# Patient Record
Sex: Female | Born: 2000 | Hispanic: No | Marital: Single | Smoking: Former smoker
Health system: Southern US, Community
[De-identification: ages and names within clinical notes are randomized; demographics above are authoritative.]

## PROBLEM LIST (undated history)

## (undated) ENCOUNTER — Inpatient Hospital Stay (HOSPITAL_COMMUNITY): Payer: Self-pay

## (undated) DIAGNOSIS — F319 Bipolar disorder, unspecified: Secondary | ICD-10-CM

## (undated) DIAGNOSIS — F32A Depression, unspecified: Secondary | ICD-10-CM

## (undated) DIAGNOSIS — F909 Attention-deficit hyperactivity disorder, unspecified type: Secondary | ICD-10-CM

## (undated) DIAGNOSIS — F419 Anxiety disorder, unspecified: Secondary | ICD-10-CM

## (undated) DIAGNOSIS — R569 Unspecified convulsions: Secondary | ICD-10-CM

## (undated) DIAGNOSIS — F99 Mental disorder, not otherwise specified: Secondary | ICD-10-CM

## (undated) DIAGNOSIS — F329 Major depressive disorder, single episode, unspecified: Secondary | ICD-10-CM

---
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
## 2019-01-08 ENCOUNTER — Other Ambulatory Visit: Payer: Self-pay | Admitting: Pediatrics

## 2019-01-08 ENCOUNTER — Other Ambulatory Visit: Payer: Self-pay

## 2019-01-08 DIAGNOSIS — F902 Attention-deficit hyperactivity disorder, combined type: Secondary | ICD-10-CM

## 2019-01-08 MED ORDER — LISDEXAMFETAMINE DIMESYLATE 40 MG PO CAPS: 40.0000 mg | ORAL_CAPSULE | Freq: Every day | ORAL | 0 refills | Status: DC
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
## 2019-02-13 ENCOUNTER — Encounter: Payer: Self-pay | Admitting: Family

## 2019-02-13 ENCOUNTER — Other Ambulatory Visit: Payer: Self-pay

## 2019-02-13 ENCOUNTER — Ambulatory Visit (INDEPENDENT_AMBULATORY_CARE_PROVIDER_SITE_OTHER): Payer: Medicaid Other | Admitting: Family

## 2019-02-13 VITALS — BP 126/82 | HR 89 | Wt 163.0 lb

## 2019-02-13 DIAGNOSIS — E559 Vitamin D deficiency, unspecified: Secondary | ICD-10-CM

## 2019-02-13 DIAGNOSIS — Z3046 Encounter for surveillance of implantable subdermal contraceptive: Secondary | ICD-10-CM | POA: Diagnosis not present

## 2019-02-13 DIAGNOSIS — Z113 Encounter for screening for infections with a predominantly sexual mode of transmission: Secondary | ICD-10-CM

## 2019-02-13 DIAGNOSIS — Z3202 Encounter for pregnancy test, result negative: Secondary | ICD-10-CM
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
## 2019-02-14 LAB — C. TRACHOMATIS/N. GONORRHOEAE RNA
C. trachomatis RNA, TMA: NOT DETECTED
N. gonorrhoeae RNA, TMA: NOT DETECTED

## 2019-02-14 LAB — WET PREP BY MOLECULAR PROBE
Candida species: NOT DETECTED
Gardnerella vaginalis: NOT DETECTED
MICRO NUMBER:: 537613
SPECIMEN QUALITY:: ADEQUATE
Trichomonas vaginosis: NOT DETECTED

## 2019-02-24 ENCOUNTER — Other Ambulatory Visit: Payer: Self-pay

## 2019-02-24 ENCOUNTER — Encounter (HOSPITAL_BASED_OUTPATIENT_CLINIC_OR_DEPARTMENT_OTHER): Payer: Self-pay | Admitting: *Deleted

## 2019-02-24 ENCOUNTER — Emergency Department (HOSPITAL_BASED_OUTPATIENT_CLINIC_OR_DEPARTMENT_OTHER)
Admission: EM | Admit: 2019-02-24 | Discharge: 2019-02-24 | Disposition: A | Discharge status: Home / Self Care | Payer: Medicaid Other | Attending: Emergency Medicine | Admitting: Emergency Medicine

## 2019-02-24 DIAGNOSIS — Z79899 Other long term (current) drug therapy: Secondary | ICD-10-CM | POA: Diagnosis not present

## 2019-02-24 DIAGNOSIS — F909 Attention-deficit hyperactivity disorder, unspecified type: Secondary | ICD-10-CM | POA: Insufficient documentation

## 2019-02-24 DIAGNOSIS — K120 Recurrent oral aphthae: Secondary | ICD-10-CM | POA: Insufficient documentation

## 2019-02-24 DIAGNOSIS — K1370 Unspecified lesions of oral mucosa: Secondary | ICD-10-CM | POA: Diagnosis present

## 2019-02-24 MED ORDER — LIDOCAINE VISCOUS HCL 2 % MT SOLN
1.0000 mL | Freq: Once | OROMUCOSAL | Status: AC
  Administered 2019-02-24: 1 mL via OROMUCOSAL
  Filled 2019-02-24: qty 15

## 2019-02-24 NOTE — ED Triage Notes (Signed)
Mouth lesion.

## 2019-02-24 NOTE — Discharge Instructions (Addendum)
This site should resolve without complication. I recommend avoiding spicy, acidic and salty foods which can cause increased pain.  You may apply a small dab of the lidocaine prescribed every 2-3 hours if needed for pain relief.
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
## 2019-06-04 ENCOUNTER — Encounter: Payer: Self-pay | Admitting: Family

## 2019-06-04 ENCOUNTER — Telehealth: Payer: Self-pay

## 2019-06-04 DIAGNOSIS — B9689 Other specified bacterial agents as the cause of diseases classified elsewhere: Secondary | ICD-10-CM

## 2019-06-04 NOTE — Telephone Encounter (Signed)
Called patient. She reports that she has started having changes in vaginal discharge as it has in the past with BV. She has no foul odor, no itching or burning. No provider in office this week and nurse visit schedule full for tomorrow AM. She asks for medication to be sent to pharmacy. Suggested patient be seen in the office for swabs if symptoms worsen or persist with medication prescribed.

## 2019-06-05 ENCOUNTER — Encounter: Payer: Self-pay | Admitting: Family

## 2019-06-05 MED ORDER — CLINDAMYCIN PHOSPHATE 2 % VA CREA: 1.0000 | TOPICAL_CREAM | Freq: Every day | VAGINAL | 0 refills | Status: DC

## 2019-06-05 NOTE — Telephone Encounter (Signed)
Made pt aware via MyChart.

## 2019-06-05 NOTE — Telephone Encounter (Signed)
Sent clindamycin vaginal cream as pt has allergy to metronidazole

## 2019-06-19 ENCOUNTER — Encounter: Payer: Self-pay | Admitting: Family

## 2019-06-26 ENCOUNTER — Encounter: Payer: Self-pay | Admitting: Family

## 2019-07-03 ENCOUNTER — Encounter: Payer: Self-pay | Admitting: Family

## 2019-07-03 ENCOUNTER — Ambulatory Visit (INDEPENDENT_AMBULATORY_CARE_PROVIDER_SITE_OTHER): Payer: Medicaid Other | Admitting: Family

## 2019-07-03 ENCOUNTER — Other Ambulatory Visit: Payer: Self-pay

## 2019-07-03 ENCOUNTER — Other Ambulatory Visit: Payer: Self-pay | Admitting: Pediatrics

## 2019-07-03 VITALS — BP 125/79 | HR 94 | Ht 64.0 in | Wt 174.8 lb

## 2019-07-03 DIAGNOSIS — N760 Acute vaginitis: Secondary | ICD-10-CM

## 2019-07-03 DIAGNOSIS — Z87898 Personal history of other specified conditions: Secondary | ICD-10-CM | POA: Diagnosis not present

## 2019-07-03 DIAGNOSIS — N898 Other specified noninflammatory disorders of vagina: Secondary | ICD-10-CM

## 2019-07-03 DIAGNOSIS — B9689 Other specified bacterial agents as the cause of diseases classified elsewhere: Secondary | ICD-10-CM
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
## 2019-07-03 NOTE — Patient Instructions (Signed)
Seizures may happen at any time. It is important to take certain precautions to maintain your safety.   You should not drive. You must report yourself to the [facility] of [employer] as a second language [employer] Program by calling [phone].When possible, take showers instead of baths, as it is possible to drown in even shallow water during a seizure. Do not swim unsupervised or in open water where rescue could be difficult. Do not climb to heights and do not operate heavy machinery. When cooking, use the back burners of the stove and avoid open flames or hot stove tops. Avoid any activities which could be dangerous in the event of a loss of consciousness.   We will call you with results from today's visit.

## 2019-07-04 ENCOUNTER — Other Ambulatory Visit: Payer: Self-pay | Admitting: Family

## 2019-07-04 DIAGNOSIS — B373 Candidiasis of vulva and vagina: Secondary | ICD-10-CM

## 2019-07-04 DIAGNOSIS — N760 Acute vaginitis: Secondary | ICD-10-CM

## 2019-07-04 DIAGNOSIS — B3731 Acute candidiasis of vulva and vagina: Secondary | ICD-10-CM

## 2019-07-04 DIAGNOSIS — B9689 Other specified bacterial agents as the cause of diseases classified elsewhere: Secondary | ICD-10-CM

## 2019-07-04 LAB — WET PREP BY MOLECULAR PROBE
Candida species: DETECTED — AB
Gardnerella vaginalis: NOT DETECTED
MICRO NUMBER:: 1018802
SPECIMEN QUALITY:: ADEQUATE
Trichomonas vaginosis: NOT DETECTED

## 2019-07-04 MED ORDER — CLINDAMYCIN PHOSPHATE 2 % VA CREA: TOPICAL_CREAM | VAGINAL | 0 refills | Status: DC

## 2019-07-04 MED ORDER — FLUCONAZOLE 150 MG PO TABS: ORAL_TABLET | ORAL | 0 refills | Status: DC

## 2019-07-05 ENCOUNTER — Encounter: Payer: Self-pay | Admitting: Family

## 2019-07-22 ENCOUNTER — Encounter: Payer: Self-pay | Admitting: Family

## 2019-07-22 ENCOUNTER — Other Ambulatory Visit: Payer: Self-pay | Admitting: Family

## 2019-07-22 DIAGNOSIS — B9689 Other specified bacterial agents as the cause of diseases classified elsewhere: Secondary | ICD-10-CM

## 2019-07-22 DIAGNOSIS — N760 Acute vaginitis: Secondary | ICD-10-CM

## 2019-07-31 ENCOUNTER — Other Ambulatory Visit: Payer: Self-pay

## 2019-07-31 ENCOUNTER — Encounter: Payer: Self-pay | Admitting: Family

## 2019-07-31 DIAGNOSIS — F902 Attention-deficit hyperactivity disorder, combined type: Secondary | ICD-10-CM

## 2019-08-01 MED ORDER — LISDEXAMFETAMINE DIMESYLATE 40 MG PO CAPS: 40.0000 mg | ORAL_CAPSULE | Freq: Every day | ORAL | 0 refills | Status: DC

## 2019-08-05 ENCOUNTER — Encounter: Payer: Self-pay | Admitting: Family

## 2019-08-10 ENCOUNTER — Encounter: Payer: Self-pay | Admitting: Family

## 2019-08-13 ENCOUNTER — Other Ambulatory Visit: Payer: Self-pay | Admitting: Family

## 2019-08-13 ENCOUNTER — Encounter: Payer: Self-pay | Admitting: Family

## 2019-08-13 DIAGNOSIS — R42 Dizziness and giddiness: Secondary | ICD-10-CM

## 2019-08-15 ENCOUNTER — Encounter: Payer: Self-pay | Admitting: Family

## 2019-08-15 ENCOUNTER — Other Ambulatory Visit: Payer: Medicaid Other

## 2019-08-15 ENCOUNTER — Other Ambulatory Visit: Payer: Self-pay

## 2019-08-15 DIAGNOSIS — R42 Dizziness and giddiness: Secondary | ICD-10-CM

## 2019-08-15 NOTE — Progress Notes (Signed)
Labs drawn by Theressa ford, CMA 

## 2019-08-18 ENCOUNTER — Other Ambulatory Visit: Payer: Self-pay | Admitting: Family

## 2019-08-18 ENCOUNTER — Encounter: Payer: Self-pay | Admitting: Family

## 2019-08-18 MED ORDER — VITAMIN D (ERGOCALCIFEROL) 1.25 MG (50000 UNIT) PO CAPS: 50000.0000 [IU] | ORAL_CAPSULE | ORAL | 0 refills | Status: DC

## 2019-08-19 ENCOUNTER — Encounter: Payer: Self-pay | Admitting: Family

## 2019-08-20 ENCOUNTER — Other Ambulatory Visit: Payer: Self-pay | Admitting: Pediatrics

## 2019-08-20 ENCOUNTER — Encounter: Payer: Self-pay | Admitting: Family

## 2019-08-20 DIAGNOSIS — B3731 Acute candidiasis of vulva and vagina: Secondary | ICD-10-CM

## 2019-08-20 DIAGNOSIS — B373 Candidiasis of vulva and vagina: Secondary | ICD-10-CM

## 2019-08-20 LAB — ADD ON CMP
AG Ratio: 1.6 (calc) (ref 1.0–2.5)
ALT: 18 U/L (ref 5–32)
AST: 22 U/L (ref 12–32)
Albumin: 4.4 g/dL (ref 3.6–5.1)
Alkaline phosphatase (APISO): 74 U/L (ref 36–128)
BUN/Creatinine Ratio: 6 (calc) (ref 6–22)
BUN: 5 mg/dL — ABNORMAL LOW (ref 7–20)
CO2: 19 mmol/L — ABNORMAL LOW (ref 20–32)
Calcium: 10 mg/dL (ref 8.9–10.4)
Chloride: 103 mmol/L (ref 98–110)
Creat: 0.81 mg/dL (ref 0.50–1.00)
GFR, Est African American: 122 mL/min/{1.73_m2} (ref >=60)
GFR, Est Non African American: 105 mL/min/{1.73_m2} (ref >=60)
Globulin: 2.8 g/dL (calc) (ref 2.0–3.8)
Glucose, Bld: 88 mg/dL (ref 65–99)
Potassium: 4.2 mmol/L (ref 3.8–5.1)
Sodium: 143 mmol/L (ref 135–146)
Total Bilirubin: 0.5 mg/dL (ref 0.2–1.1)
Total Protein: 7.2 g/dL (ref 6.3–8.2)

## 2019-08-20 LAB — CBC WITH DIFFERENTIAL/PLATELET
Absolute Monocytes: 403 cells/uL (ref 200–950)
Basophils Absolute: 53 cells/uL (ref 0–200)
Basophils Relative: 0.8 %
Eosinophils Absolute: 99 cells/uL (ref 15–500)
Eosinophils Relative: 1.5 %
HCT: 41.2 % (ref 35.0–45.0)
Hemoglobin: 13.4 g/dL (ref 11.7–15.5)
Lymphs Abs: 1769 cells/uL (ref 850–3900)
MCH: 28.4 pg (ref 27.0–33.0)
MCHC: 32.5 g/dL (ref 32.0–36.0)
MCV: 87.3 fL (ref 80.0–100.0)
MPV: 11.5 fL (ref 7.5–12.5)
Monocytes Relative: 6.1 %
Neutro Abs: 4277 cells/uL (ref 1500–7800)
Neutrophils Relative %: 64.8 %
Platelets: 279 10*3/uL (ref 140–400)
RBC: 4.72 10*6/uL (ref 3.80–5.10)
RDW: 11.6 % (ref 11.0–15.0)
Total Lymphocyte: 26.8 %
WBC: 6.6 10*3/uL (ref 3.8–10.8)

## 2019-08-20 LAB — TEST AUTHORIZATION

## 2019-08-20 LAB — HEMOGLOBIN A1C W/OUT EAG: Hgb A1c MFr Bld: 5.4 % of total Hgb (ref <5.7)

## 2019-08-20 LAB — T4, FREE: Free T4: 1 ng/dL (ref 0.8–1.4)

## 2019-08-20 LAB — VITAMIN D 25 HYDROXY (VIT D DEFICIENCY, FRACTURES): Vit D, 25-Hydroxy: 17 ng/mL — ABNORMAL LOW (ref 30–100)

## 2019-08-20 LAB — LIPID PANEL
Cholesterol: 156 mg/dL (ref <170)
HDL: 59 mg/dL (ref >45)
LDL Cholesterol (Calc): 87 mg/dL (calc) (ref <110)
Non-HDL Cholesterol (Calc): 97 mg/dL (calc) (ref <120)
Total CHOL/HDL Ratio: 2.6 (calc) (ref <5.0)
Triglycerides: 35 mg/dL (ref <90)

## 2019-08-20 LAB — VITAMIN B12: Vitamin B-12: 334 pg/mL (ref 200–1100)

## 2019-08-20 LAB — AMYLASE: Amylase: 88 U/L (ref 21–101)

## 2019-08-20 LAB — TSH: TSH: 2.37 mIU/L

## 2019-08-20 MED ORDER — FLUCONAZOLE 150 MG PO TABS: ORAL_TABLET | ORAL | 0 refills | Status: DC

## 2019-08-29 ENCOUNTER — Encounter: Payer: Self-pay | Admitting: Family

## 2019-08-29 ENCOUNTER — Encounter: Payer: Medicaid Other | Admitting: Obstetrics & Gynecology

## 2019-09-08 ENCOUNTER — Encounter: Payer: Self-pay | Admitting: Family

## 2019-09-24 ENCOUNTER — Encounter: Payer: Self-pay | Admitting: Family

## 2019-09-24 ENCOUNTER — Other Ambulatory Visit: Payer: Self-pay | Admitting: Pediatrics

## 2019-09-24 ENCOUNTER — Telehealth: Payer: Self-pay | Admitting: Pediatrics

## 2019-09-24 ENCOUNTER — Other Ambulatory Visit: Payer: Self-pay | Admitting: Family

## 2019-09-24 DIAGNOSIS — F902 Attention-deficit hyperactivity disorder, combined type: Secondary | ICD-10-CM

## 2019-09-24 DIAGNOSIS — Z87898 Personal history of other specified conditions: Secondary | ICD-10-CM

## 2019-09-24 MED ORDER — LISDEXAMFETAMINE DIMESYLATE 40 MG PO CAPS: 40.0000 mg | ORAL_CAPSULE | Freq: Every day | ORAL | 0 refills | Status: DC

## 2019-09-24 MED ORDER — VITAMIN D (ERGOCALCIFEROL) 1.25 MG (50000 UNIT) PO CAPS: 50000.0000 [IU] | ORAL_CAPSULE | ORAL | 0 refills | Status: DC

## 2019-09-24 NOTE — Telephone Encounter (Signed)

## 2019-09-25 ENCOUNTER — Encounter: Payer: Self-pay | Admitting: Family

## 2019-09-25 ENCOUNTER — Ambulatory Visit: Payer: Medicaid Other | Admitting: Family

## 2019-09-26 ENCOUNTER — Encounter: Payer: Medicaid Other | Admitting: Obstetrics & Gynecology

## 2019-09-30 ENCOUNTER — Encounter: Payer: Self-pay | Admitting: Family

## 2019-09-30 ENCOUNTER — Other Ambulatory Visit: Payer: Self-pay | Admitting: Pediatrics

## 2019-09-30 MED ORDER — KETOCONAZOLE 2 % EX CREA: 1.0000 "application " | TOPICAL_CREAM | Freq: Every day | CUTANEOUS | 0 refills | Status: DC

## 2019-10-01 ENCOUNTER — Encounter: Payer: Self-pay | Admitting: Family

## 2019-10-01 ENCOUNTER — Ambulatory Visit (INDEPENDENT_AMBULATORY_CARE_PROVIDER_SITE_OTHER): Payer: Medicaid Other | Admitting: Pediatrics

## 2019-10-01 ENCOUNTER — Other Ambulatory Visit: Payer: Self-pay

## 2019-10-01 ENCOUNTER — Encounter: Payer: Self-pay | Admitting: Pediatrics

## 2019-10-01 ENCOUNTER — Other Ambulatory Visit (HOSPITAL_COMMUNITY)
Admission: RE | Admit: 2019-10-01 | Discharge: 2019-10-01 | Disposition: A | Discharge status: Home / Self Care | Payer: Medicaid Other | Source: Ambulatory Visit | Attending: Pediatrics | Admitting: Pediatrics

## 2019-10-01 VITALS — Temp 97.2°F | Wt 160.5 lb

## 2019-10-01 DIAGNOSIS — Z113 Encounter for screening for infections with a predominantly sexual mode of transmission: Secondary | ICD-10-CM | POA: Insufficient documentation

## 2019-10-01 DIAGNOSIS — N760 Acute vaginitis: Secondary | ICD-10-CM

## 2019-10-01 DIAGNOSIS — B354 Tinea corporis: Secondary | ICD-10-CM

## 2019-10-01 DIAGNOSIS — N898 Other specified noninflammatory disorders of vagina: Secondary | ICD-10-CM

## 2019-10-01 LAB — POCT GLUCOSE (DEVICE FOR HOME USE): POC Glucose: 112 mg/dl — AB (ref 70–99)

## 2019-10-01 LAB — POCT GLYCOSYLATED HEMOGLOBIN (HGB A1C): Hemoglobin A1C: 5.5 % (ref 4.0–5.6)
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
## 2019-10-01 NOTE — Patient Instructions (Signed)

## 2019-10-03 LAB — URINE CYTOLOGY ANCILLARY ONLY
Chlamydia: NEGATIVE
Comment: NEGATIVE
Comment: NORMAL
Neisseria Gonorrhea: NEGATIVE

## 2019-10-03 LAB — WET PREP BY MOLECULAR PROBE
Candida species: NOT DETECTED
Gardnerella vaginalis: NOT DETECTED
MICRO NUMBER:: 10066179
SPECIMEN QUALITY:: ADEQUATE
Trichomonas vaginosis: NOT DETECTED

## 2019-10-10 ENCOUNTER — Encounter: Payer: Self-pay | Admitting: Family

## 2019-10-12 ENCOUNTER — Encounter: Payer: Self-pay | Admitting: Family

## 2019-10-14 ENCOUNTER — Encounter: Payer: Self-pay | Admitting: Family

## 2019-10-22 ENCOUNTER — Other Ambulatory Visit: Payer: Self-pay | Admitting: Family

## 2019-10-22 ENCOUNTER — Encounter: Payer: Self-pay | Admitting: Family

## 2019-10-22 DIAGNOSIS — B3731 Acute candidiasis of vulva and vagina: Secondary | ICD-10-CM

## 2019-10-22 DIAGNOSIS — B373 Candidiasis of vulva and vagina: Secondary | ICD-10-CM

## 2019-10-22 MED ORDER — FLUCONAZOLE 150 MG PO TABS: ORAL_TABLET | ORAL | 0 refills | Status: DC

## 2019-11-06 ENCOUNTER — Other Ambulatory Visit: Payer: Self-pay

## 2019-11-06 ENCOUNTER — Encounter: Payer: Self-pay | Admitting: Family Medicine

## 2019-11-06 ENCOUNTER — Ambulatory Visit (INDEPENDENT_AMBULATORY_CARE_PROVIDER_SITE_OTHER): Payer: Medicaid Other | Admitting: Family Medicine

## 2019-11-06 VITALS — BP 110/62 | HR 66 | Ht 64.0 in | Wt 162.0 lb

## 2019-11-06 DIAGNOSIS — N760 Acute vaginitis: Secondary | ICD-10-CM

## 2019-11-06 NOTE — Progress Notes (Signed)
   Subjective:    Patient ID: Elizabeth Ray, female    DOB: [birth year], 19 y.o.   MRN: [id number]  HPI Patient referred her for recurrent vaginitis. Has history of BV and yeast vulvovaginitis. NO current symptoms. Is married - husband is deployed at the moment. Not using anything for contraception as she is not currently sexually active. Planning on pregnancy in the next year or two. Will use condoms when husband back from deployment.  Has history of 8wk MAB and had D&E (2015).   I have reviewed the patients past medical, family, and social history.  I have reviewed the patient's medication list and allergies.    Review of Systems     Objective:   Physical Exam Vitals reviewed.  Constitutional:      Appearance: Normal appearance.  Cardiovascular:     Rate and Rhythm: Normal rate and regular rhythm.     Pulses: Normal pulses.     Heart sounds: Normal heart sounds.  Pulmonary:     Effort: Pulmonary effort is normal.     Breath sounds: Normal breath sounds.  Abdominal:     General: Abdomen is flat. Bowel sounds are normal. There is no distension.     Palpations: Abdomen is soft.     Tenderness: There is no abdominal tenderness.  Skin:    General: Skin is warm and dry.     Capillary Refill: Capillary refill takes less than 2 seconds.  Neurological:     General: No focal deficit present.     Mental Status: She is alert.  Psychiatric:        Mood and Affect: Mood normal.        Behavior: Behavior normal.        Thought Content: Thought content normal.        Judgment: Judgment normal.        Assessment & Plan:  1. Vaginitis and vulvovaginitis No current symptoms. Discussed mild soaps, no duching, wearing breathable clothing, not wearing underwear at night, probiotic use.   Patient also concerned that h/o D&E might cause problems with pregnancy later. Discussed that shouldn't cause a problem. Recommended PNV about 6 months prior to trying to get pregnant.

## 2019-12-10 ENCOUNTER — Other Ambulatory Visit: Payer: Self-pay

## 2019-12-10 DIAGNOSIS — B373 Candidiasis of vulva and vagina: Secondary | ICD-10-CM

## 2019-12-10 DIAGNOSIS — B3731 Acute candidiasis of vulva and vagina: Secondary | ICD-10-CM

## 2019-12-10 MED ORDER — FLUCONAZOLE 150 MG PO TABS: ORAL_TABLET | ORAL | 0 refills | Status: DC

## 2019-12-13 ENCOUNTER — Ambulatory Visit: Payer: Medicaid Other

## 2019-12-29 ENCOUNTER — Ambulatory Visit: Payer: Medicaid Other

## 2019-12-30 ENCOUNTER — Other Ambulatory Visit (HOSPITAL_COMMUNITY)
Admission: RE | Admit: 2019-12-30 | Discharge: 2019-12-30 | Disposition: A | Discharge status: Home / Self Care | Payer: Medicaid Other | Source: Ambulatory Visit | Attending: Advanced Practice Midwife | Admitting: Advanced Practice Midwife

## 2019-12-30 ENCOUNTER — Other Ambulatory Visit (INDEPENDENT_AMBULATORY_CARE_PROVIDER_SITE_OTHER): Payer: Medicaid Other

## 2019-12-30 ENCOUNTER — Other Ambulatory Visit: Payer: Self-pay

## 2019-12-30 VITALS — BP 107/68 | HR 76 | Wt 156.0 lb

## 2019-12-30 DIAGNOSIS — N898 Other specified noninflammatory disorders of vagina: Secondary | ICD-10-CM

## 2019-12-30 DIAGNOSIS — R399 Unspecified symptoms and signs involving the genitourinary system: Secondary | ICD-10-CM

## 2019-12-30 LAB — POCT URINALYSIS DIPSTICK
Blood, UA: NEGATIVE
Glucose, UA: NEGATIVE
Ketones, UA: NEGATIVE
Leukocytes, UA: NEGATIVE
Protein, UA: POSITIVE — AB
Spec Grav, UA: 1.02 (ref 1.010–1.025)
pH, UA: 8.5 — AB (ref 5.0–8.0)

## 2019-12-30 NOTE — Progress Notes (Addendum)
Pt states she is having some vaginal discharge, itching, and uti symptoms. Pt states two days ago after she urinated her bladder felt like it wasn't completely emptied.Urine cuture and self swab was sent to the lab.  Ashelynn Marks l Duglas Heier, CMA   Patient seen and assessed by nursing staff during this encounter. I have reviewed the chart and agree with the documentation and plan. I have also made any necessary editorial changes.  Wynelle Bourgeois, CNM 12/30/2019 8:34 PM

## 2020-01-01 LAB — CERVICOVAGINAL ANCILLARY ONLY
Bacterial Vaginitis (gardnerella): NEGATIVE
Candida Glabrata: NEGATIVE
Candida Vaginitis: NEGATIVE
Comment: NEGATIVE
Comment: NEGATIVE
Comment: NEGATIVE

## 2020-01-01 LAB — URINE CULTURE: Organism ID, Bacteria: NO GROWTH

## 2020-03-28 ENCOUNTER — Encounter: Payer: Self-pay | Admitting: Family

## 2020-03-29 ENCOUNTER — Other Ambulatory Visit: Payer: Self-pay | Admitting: Pediatrics

## 2020-03-29 DIAGNOSIS — B3731 Acute candidiasis of vulva and vagina: Secondary | ICD-10-CM

## 2020-03-29 MED ORDER — FLUCONAZOLE 150 MG PO TABS: ORAL_TABLET | ORAL | 0 refills | Status: DC

## 2021-01-28 DIAGNOSIS — G40309 Generalized idiopathic epilepsy and epileptic syndromes, not intractable, without status epilepticus: Secondary | ICD-10-CM | POA: Insufficient documentation

## 2021-04-06 DIAGNOSIS — B360 Pityriasis versicolor: Secondary | ICD-10-CM | POA: Insufficient documentation

## 2021-09-10 ENCOUNTER — Other Ambulatory Visit: Payer: Self-pay

## 2021-09-10 ENCOUNTER — Encounter (HOSPITAL_BASED_OUTPATIENT_CLINIC_OR_DEPARTMENT_OTHER): Payer: Self-pay | Admitting: Urology

## 2021-09-10 ENCOUNTER — Emergency Department (HOSPITAL_BASED_OUTPATIENT_CLINIC_OR_DEPARTMENT_OTHER)
Admission: EM | Admit: 2021-09-10 | Discharge: 2021-09-10 | Disposition: A | Discharge status: Home / Self Care | Payer: Medicaid Other | Attending: Emergency Medicine | Admitting: Emergency Medicine

## 2021-09-10 DIAGNOSIS — B349 Viral infection, unspecified: Secondary | ICD-10-CM | POA: Diagnosis not present

## 2021-09-10 DIAGNOSIS — R059 Cough, unspecified: Secondary | ICD-10-CM | POA: Diagnosis present

## 2021-09-10 DIAGNOSIS — U071 COVID-19: Secondary | ICD-10-CM | POA: Diagnosis not present

## 2021-09-10 DIAGNOSIS — F17210 Nicotine dependence, cigarettes, uncomplicated: Secondary | ICD-10-CM | POA: Diagnosis not present

## 2021-09-10 MED ORDER — ONDANSETRON 4 MG PO TBDP: 4.0000 mg | ORAL_TABLET | Freq: Three times a day (TID) | ORAL | 0 refills | Status: DC | PRN

## 2021-09-10 MED ORDER — PROMETHAZINE-DM 6.25-15 MG/5ML PO SYRP: 5.0000 mL | ORAL_SOLUTION | Freq: Four times a day (QID) | ORAL | 0 refills | Status: DC | PRN

## 2021-09-10 MED ORDER — IBUPROFEN 400 MG PO TABS
600.0000 mg | ORAL_TABLET | Freq: Once | ORAL | Status: AC
  Administered 2021-09-10: 600 mg via ORAL
  Filled 2021-09-10: qty 1

## 2021-09-10 MED ORDER — ACETAMINOPHEN 500 MG PO TABS
1000.0000 mg | ORAL_TABLET | Freq: Once | ORAL | Status: AC
  Administered 2021-09-10: 1000 mg via ORAL
  Filled 2021-09-10: qty 2

## 2021-09-10 MED ORDER — BENZONATATE 100 MG PO CAPS: 100.0000 mg | ORAL_CAPSULE | Freq: Three times a day (TID) | ORAL | 0 refills | Status: DC

## 2021-09-10 NOTE — ED Triage Notes (Signed)
Pt states onset of symptoms yesterday, + COVID test today at work  States body aches, sweats, fever, loss of voice, lost of taste, SOB with exertion Spo2 94% while ambulating

## 2021-09-10 NOTE — Discharge Instructions (Addendum)
Given that you have already tested positive for COVID-19 there is no benefit to retesting you.  Please use Tylenol or ibuprofen for pain.  You may use 600 mg ibuprofen every 6 hours or 1000 mg of Tylenol every 6 hours.  You may choose to alternate between the 2.  This would be most effective.  Not to exceed 4 g of Tylenol within 24 hours.  Not to exceed 3200 mg ibuprofen 24 hours.   Please drink plenty of water.  I prescribed you 2 different cough medications.  I also prescribed you Zofran should you experience any nausea.   Viral Illness TREATMENT  Treatment is directed at relieving symptoms. There is no cure. Antibiotics are not effective, because the infection is caused by a virus, not by bacteria. Treatment may include:  Increased fluid intake. Sports drinks offer valuable electrolytes, sugars, and fluids.  Breathing heated mist or steam (vaporizer or shower).  Eating chicken soup or other clear broths, and maintaining good nutrition.  Getting plenty of rest.  Using gargles or lozenges for comfort.  Increasing usage of your inhaler if you have asthma.  Return to work when your temperature has returned to normal.  Gargle warm salt water and spit it out for sore throat. Take benadryl to decrease sinus secretions. Continue to alternate between Tylenol and ibuprofen for pain and fever control.  Follow Up: Follow up with your primary care doctor in 5-7 days for recheck of ongoing symptoms.  Return to emergency department for emergent changing or worsening of symptoms.
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
## 2021-09-10 NOTE — ED Notes (Signed)
Discharge instructions discussed with pt. Pt verbalized understanding. Pt stable and ambulatory.  °

## 2021-09-22 NOTE — Progress Notes (Signed)
GYNECOLOGY ANNUAL PREVENTATIVE CARE ENCOUNTER NOTE  History:     Elizabeth Ray is a 21 y.o. G78P0010 female here for a routine annual gynecologic exam.  Current complaints: she thinks she may have a yeast infection - she gets them around her period. Had a line between her bottom (we did exam and normal).  She had concern it could be hemorrhoid but no evidence of this on exam.   Notes decreased sensation during clitoral stimulation.      Denies abnormal vaginal bleeding, discharge, pelvic pain, problems with intercourse or other gynecologic concerns.     Gynecologic History Patient's last menstrual period was 09/22/2021 (exact date). Contraception: none Last Pap: not yet had   Obstetric History OB History  Gravida Para Term Preterm AB Living  1       1    SAB IAB Ectopic Multiple Live Births  1            # Outcome Date GA Lbr Len/2nd Weight Sex Delivery Anes PTL Lv  1 SAB 2015            Past Medical History:  Diagnosis Date   ADHD (attention deficit hyperactivity disorder)    Anxiety    Bipolar and related disorder (HCC) 07/15/2015   Depression    Mental disorder    Seizures (HCC)    up until age 4    Past Surgical History:  Procedure Laterality Date   DILATION AND EVACUATION N/A 07/16/2014   Procedure: DILATATION AND EVACUATION;  Surgeon: Tilda Burrow, MD;  Location: [facility];  Service: Gynecology;  Laterality: N/A;    Current Outpatient Medications on File Prior to Visit  Medication Sig Dispense Refill   levETIRAcetam (KEPPRA) 500 MG tablet Take by mouth.     benzonatate (TESSALON) 100 MG capsule Take 1 capsule (100 mg total) by mouth every 8 (eight) hours. (Patient not taking: Reported on 09/23/2021) 21 capsule 0   fluticasone (FLONASE) 50 MCG/ACT nasal spray Place 2 sprays into both nostrils daily. (Patient not taking: Reported on 02/13/2019) 16 g 1   lisdexamfetamine (VYVANSE) 40 MG capsule Take 1 capsule (40 mg total) by mouth daily with breakfast.  (Patient not taking: Reported on 11/06/2019) 30 capsule 0   ondansetron (ZOFRAN-ODT) 4 MG disintegrating tablet Take 1 tablet (4 mg total) by mouth every 8 (eight) hours as needed for nausea or vomiting. (Patient not taking: Reported on 09/23/2021) 20 tablet 0   promethazine-dextromethorphan (PROMETHAZINE-DM) 6.25-15 MG/5ML syrup Take 5 mLs by mouth 4 (four) times daily as needed for cough. (Patient not taking: Reported on 09/23/2021) 118 mL 0   Vitamin D, Ergocalciferol, (DRISDOL) 1.25 MG (50000 UNIT) CAPS capsule Take 1 capsule (50,000 Units total) by mouth every 7 (seven) days. (Patient not taking: Reported on 10/01/2019) 8 capsule 0   No current facility-administered medications on file prior to visit.    Allergies  Allergen Reactions   Metronidazole And Related Other (See Comments)    Seizure   Codeine     Social History:  reports that she has been smoking cigarettes. She has been smoking an average of .5 packs per day. She has never used smokeless tobacco. She reports that she does not drink alcohol and does not use drugs.  Family History  Problem Relation Age of Onset   Diabetes Mother    Diabetes Other    Hypertension Other     The following portions of the patient's history were reviewed and updated as appropriate:  allergies, current medications, past family history, past medical history, past social history, past surgical history and problem list.  Review of Systems Pertinent items noted in HPI and remainder of comprehensive ROS otherwise negative.  Physical Exam:  BP 107/65    Pulse 68    Ht 5\' 4"  (1.626 m)    Wt 157 lb (71.2 kg)    LMP 09/22/2021 (Exact Date)    BMI 26.95 kg/m  CONSTITUTIONAL: Well-developed, well-nourished female in no acute distress.  HENT:  Normocephalic, atraumatic, External right and left ear normal.  EYES: Conjunctivae and EOM are normal. Pupils are equal, round, and reactive to light. No scleral icterus.  NECK: Normal range of motion, supple, no  masses.  Normal thyroid.  SKIN: Skin is warm and dry. No rash noted. Not diaphoretic. No erythema. No pallor. MUSCULOSKELETAL: Normal range of motion. No tenderness.  No cyanosis, clubbing, or edema. NEUROLOGIC: Alert and oriented to person, place, and time. Normal reflexes, muscle tone coordination.  PSYCHIATRIC: Normal mood and affect. Normal behavior. Normal judgment and thought content.  CARDIOVASCULAR: Normal heart rate noted, regular rhythm RESPIRATORY: Clear to auscultation bilaterally. Effort and breath sounds normal, no problems with respiration noted.  BREASTS: Symmetric in size. No masses, tenderness, skin changes, nipple drainage, or lymphadenopathy bilaterally. Performed in the presence of a chaperone. ABDOMEN: Soft, no distention noted.  No tenderness, rebound or guarding.   PELVIC: External genitalia normal, Vagina normal without discharge, Urethra without abnormality or discharge, no bladder tenderness, cervix normal in appearance, no CMT, uterus normal size, shape, and consistency, no adnexal masses or tenderness. Performed in the presence of a chaperone.  Assessment and Plan:    1. Encounter for annual routine gynecological examination - Cervical cancer screening: Discussed screening Q3 years. Reviewed importance of annual exams and limits of pap smear. Pap with reflex HPV done  - GC/CT: Discussed and recommended. Pt  accepts - Birth Control: Declines - Breast Health: Encouraged self breast awareness/exams. Teaching provided. - Discussed issues around intercourse may be an emotional/mental component since this is newer. On exam there is no physical explanation/finding.  - Follow-up: 12 months and prn   Routine preventative health maintenance measures emphasized. Please refer to After Visit Summary for other counseling recommendations.   11/20/2021, MD, FACOG Obstetrician & Gynecologist, [facility] for [facility], [facility]

## 2021-09-23 ENCOUNTER — Ambulatory Visit (INDEPENDENT_AMBULATORY_CARE_PROVIDER_SITE_OTHER): Payer: Medicaid Other | Admitting: Obstetrics and Gynecology

## 2021-09-23 ENCOUNTER — Encounter: Payer: Self-pay | Admitting: Obstetrics and Gynecology

## 2021-09-23 ENCOUNTER — Other Ambulatory Visit: Payer: Self-pay

## 2021-09-23 ENCOUNTER — Other Ambulatory Visit (HOSPITAL_COMMUNITY)
Admission: RE | Admit: 2021-09-23 | Discharge: 2021-09-23 | Disposition: A | Discharge status: Home / Self Care | Payer: Medicaid Other | Source: Ambulatory Visit | Attending: Obstetrics and Gynecology | Admitting: Obstetrics and Gynecology

## 2021-09-23 VITALS — BP 107/65 | HR 68 | Ht 64.0 in | Wt 157.0 lb

## 2021-09-23 DIAGNOSIS — Z01419 Encounter for gynecological examination (general) (routine) without abnormal findings: Secondary | ICD-10-CM | POA: Insufficient documentation

## 2021-09-23 DIAGNOSIS — R930 Abnormal findings on diagnostic imaging of skull and head, not elsewhere classified: Secondary | ICD-10-CM | POA: Insufficient documentation

## 2021-09-23 DIAGNOSIS — G43009 Migraine without aura, not intractable, without status migrainosus: Secondary | ICD-10-CM | POA: Insufficient documentation

## 2021-09-23 DIAGNOSIS — F419 Anxiety disorder, unspecified: Secondary | ICD-10-CM | POA: Insufficient documentation

## 2021-09-26 LAB — CERVICOVAGINAL ANCILLARY ONLY
Bacterial Vaginitis (gardnerella): NEGATIVE
Candida Glabrata: NEGATIVE
Candida Vaginitis: NEGATIVE
Chlamydia: NEGATIVE
Comment: NEGATIVE
Comment: NEGATIVE
Comment: NEGATIVE
Comment: NEGATIVE
Comment: NORMAL
Neisseria Gonorrhea: NEGATIVE

## 2021-09-26 LAB — CYTOLOGY - PAP: Diagnosis: NEGATIVE

## 2021-10-07 ENCOUNTER — Ambulatory Visit (INDEPENDENT_AMBULATORY_CARE_PROVIDER_SITE_OTHER): Payer: Medicaid Other | Admitting: Family Medicine

## 2021-10-07 ENCOUNTER — Encounter: Payer: Self-pay | Admitting: Family Medicine

## 2021-10-07 VITALS — BP 110/60 | HR 77 | Temp 97.5°F | Ht 63.0 in | Wt 155.0 lb

## 2021-10-07 DIAGNOSIS — G40309 Generalized idiopathic epilepsy and epileptic syndromes, not intractable, without status epilepticus: Secondary | ICD-10-CM

## 2021-10-07 MED ORDER — LEVETIRACETAM 500 MG PO TABS: 500.0000 mg | ORAL_TABLET | Freq: Two times a day (BID) | ORAL | 0 refills | Status: DC

## 2021-10-07 NOTE — Patient Instructions (Signed)
Thank you for choosing [facility] for your [facility] Care needs. I am excited for the opportunity to partner with you to meet your health care goals. It was a pleasure meeting you today!  Recommendations from today's visit: Neurology referral placed - let us know if you don't hear from them in the next 2 weeks or so. One supply of Keppra send in if you need a referral before you can see Neurology, then let us know Schedule a physical with labs for the next 6-12 months Check to see if you received a Tetanus or Tdap vaccine last year - if not, you will need to get this from the health department since you have Medicaid   Information on diet, exercise, and health maintenance recommendations are listed below. This is information to help you be sure you are on track for optimal health and monitoring.   Please look over this and let us know if you have any questions or if you have completed any of the health maintenance outside of [facility] so that we can be sure your records are up to date.  ___________________________________________________________  MyChart:  For all urgent or time sensitive needs we ask that you please call the office to avoid delays. Our number is [phone]. MyChart is not constantly monitored and due to the large volume of messages a day, replies may take up to 72 business hours.  MyChart Policy: MyChart allows for you to see your visit notes, after visit summary, provider recommendations, lab and tests results, make an appointment, request refills, and contact your provider or the office for non-urgent questions or concerns. Providers are seeing patients during normal business hours and do not have built in time to review MyChart messages.  We ask that you allow a minimum of 3 business days for responses to [facility]SpanMyChart messages. For this reason, please do not send urgent requests through MyChart. Please call the office at [phone]. New and  ongoing conditions may require a visit. We have virtual and in-person visits available for your convenience.  Complex MyChart concerns may require a visit. Your provider may request you schedule a virtual or in-person visit to ensure we are providing the best care possible. MyChart messages sent after 11:00 AM on Friday will not be received by the provider until Monday morning.    Lab and Test Results: You will receive your lab and test results on MyChart as soon as they are completed and results have been sent by the lab or testing facility. Due to this service, you will receive your results BEFORE your provider.  I review lab and test results each morning prior to seeing patients. Some results require collaboration with other providers to ensure you are receiving the most appropriate care. For this reason, we ask that you please allow a minimum of 3-5 business days from the time that ALL results have been received for your provider to receive and review lab and test results and contact you about these.  Most lab and test result comments from the provider will be sent through MyChart. Your provider may recommend changes to the plan of care, follow-up visits, repeat testing, ask questions, or request an office visit to discuss these results. You may reply directly to this message or call the office to provide information for the provider or set up an appointment. In some instances, you will be called with test results and recommendations. Please let us know if this is preferred and  we will make note of this in your chart to provide this for you.    If you have not heard a response to your lab or test results in 5 business days from all results returning to MyChart, please call the office to let us know. We ask that you please avoid calling prior to this time unless there is an emergent concern. Due to high call volumes, this can delay the resulting process.  After Hours: For all non-emergency after hours  needs, please call the office at [phone] and select the option to reach the on-call  service. On-call services are shared between multiple [place] offices and therefore it will not be possible to speak directly with your provider. On-call providers may provide medical advice and recommendations, but are unable to provide refills for maintenance medications.  For all emergency or urgent medical needs after normal business hours, we recommend that you seek care at the closest Urgent Care or Emergency Department to ensure appropriate treatment in a timely manner.  [facility] has a 24 hour emergency room located on the ground floor for your convenience.   Urgent Concerns During the Business Day Providers are seeing patients from 8AM to 5PM with a busy schedule and are most often not able to respond to non-urgent calls until the end of the day or the next business day. If you should have URGENT concerns during the day, please call and speak to the nurse or schedule a same day appointment so that we can address your concern without delay.   Thank you, again, for choosing me as your health care partner. I appreciate your trust and look forward to learning more about you.   Lollie Marrowaylor B. Reola CalkinsBeck, DNP, FNP-C  ___________________________________________________________  Health Maintenance Recommendations Screening Testing Mammogram Every 1-2 years based on history and risk factors Starting at age 450 Pap Smear Ages 21-39 every 3 years Ages 3630-65 every 5 years with HPV testing More frequent testing may be required based on results and history Colon Cancer Screening Every 1-10 years based on test performed, risk factors, and history Starting at age 22 Bone Density Screening Every 2-10 years based on history Starting at age 22 for women Recommendations for men differ based on medication usage, history, and risk factors AAA Screening One time ultrasound Men 7365-22 years old  who have ever smoked Lung Cancer Screening Low Dose Lung CT every 12 months Age 70-80 years with a 20 pack-year smoking history who still smoke or who have quit within the last 15 years  Screening Labs Routine  Labs: Complete Blood Count (CBC), Complete Metabolic Panel (CMP), Cholesterol (Lipid Panel) Every 6-12 months based on history and medications May be recommended more frequently based on current conditions or previous results Hemoglobin A1c Lab Every 3-12 months based on history and previous results Starting at age 22 or earlier with diagnosis of diabetes, high cholesterol, BMI >26, and/or risk factors Frequent monitoring for patients with diabetes to ensure blood sugar control Thyroid Panel (TSH w/ T3 & T4) Every 6 months based on history, symptoms, and risk factors May be repeated more often if on medication HIV One time testing for all patients 10613 and older May be repeated more frequently for patients with increased risk factors or exposure Hepatitis C One time testing for all patients 2718 and older May be repeated more frequently for patients with increased risk factors or exposure Gonorrhea, Chlamydia Every 12 months for all sexually active persons 13-24 years Additional monitoring  may be recommended for those who are considered high risk or who have symptoms PSA Men 69-89 years old with risk factors Additional screening may be recommended from age 13-69 based on risk factors, symptoms, and history  Vaccine Recommendations Tetanus Booster All adults every 10 years Flu Vaccine All patients 6 months and older every year COVID Vaccine All patients 12 years and older Initial dosing with booster May recommend additional booster based on age and health history HPV Vaccine 2 doses all patients age 48-26 Dosing may be considered for patients over 26 Shingles Vaccine (Shingrix) 2 doses all adults 50 years and older Pneumonia (Pneumovax 45) All adults 65 years and  older May recommend earlier dosing based on health history Pneumonia (Prevnar 81) All adults 65 years and older Dosed 1 year after Pneumovax 23 Pneumonia (Prevnar 20) All adults 65 years and older (adults 19-64 with certain conditions or risk factors) 1 dose  For those who have no received Prevnar 13 vaccine previously   Additional Screening, Testing, and Vaccinations may be recommended on an individualized basis based on family history, health history, risk factors, and/or exposure.  __________________________________________________________  Diet Recommendations for All Patients  I recommend that all patients maintain a diet low in saturated fats, carbohydrates, and cholesterol. While this can be challenging at first, it is not impossible and small changes can make big differences.  Things to try: Decreasing the amount of soda, sweet tea, and/or juice to one or less per day and replace with water While water is always the first choice, if you do not like water you may consider adding a water additive without sugar to improve the taste other sugar free drinks Replace potatoes with a brightly colored vegetable at dinner Use healthy oils, such as canola oil or olive oil, instead of butter or hard margarine Limit your bread intake to two pieces or less a day Replace regular pasta with low carb pasta options Bake, broil, or grill foods instead of frying Monitor portion sizes  Eat smaller, more frequent meals throughout the day instead of large meals  An important thing to remember is, if you love foods that are not great for your health, you don't have to give them up completely. Instead, allow these foods to be a reward when you have done well. Allowing yourself to still have special treats every once in a while is a nice way to tell yourself thank you for working hard to keep yourself healthy.   Also remember that every day is a new day. If you have a bad day and "fall off the wagon",  you can still climb right back up and keep moving along on your journey!  We have resources available to help you!  Some websites that may be helpful include: www.http://www.wall-moore.info/  Www.VeryWellFit.com _____________________________________________________________  Activity Recommendations for All Patients  I recommend that all adults get at least 20 minutes of moderate physical activity that elevates your heart rate at least 5 days out of the week.  Some examples include: Walking or jogging at a pace that allows you to carry on a conversation Cycling (stationary bike or outdoors) Water aerobics Yoga Weight lifting Dancing If physical limitations prevent you from putting stress on your joints, exercise in a pool or seated in a chair are excellent options.  Do determine your MAXIMUM heart rate for activity: YOUR AGE - 220 = MAX HeartRate   Remember! Do not push yourself too hard.  Start slowly and build up your pace, speed,  weight, time in exercise, etc.  Allow your body to rest between exercise and get good sleep. You will need more water than normal when you are exerting yourself. Do not wait until you are thirsty to drink. Drink with a purpose of getting in at least 8, 8 ounce glasses of water a day plus more depending on how much you exercise and sweat.    If you begin to develop dizziness, chest pain, abdominal pain, jaw pain, shortness of breath, headache, vision changes, lightheadedness, or other concerning symptoms, stop the activity and allow your body to rest. If your symptoms are severe, seek emergency evaluation immediately. If your symptoms are concerning, but not severe, please let us know so that we can recommend further evaluation.
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
## 2021-10-12 ENCOUNTER — Telehealth: Payer: Self-pay | Admitting: Family Medicine

## 2021-10-12 NOTE — Telephone Encounter (Signed)
error 

## 2021-10-13 ENCOUNTER — Telehealth (INDEPENDENT_AMBULATORY_CARE_PROVIDER_SITE_OTHER): Payer: Medicaid Other | Admitting: Family Medicine

## 2021-10-13 ENCOUNTER — Encounter: Payer: Self-pay | Admitting: Family Medicine

## 2021-10-13 DIAGNOSIS — F419 Anxiety disorder, unspecified: Secondary | ICD-10-CM

## 2021-10-13 DIAGNOSIS — F909 Attention-deficit hyperactivity disorder, unspecified type: Secondary | ICD-10-CM

## 2021-10-13 MED ORDER — LISDEXAMFETAMINE DIMESYLATE 30 MG PO CAPS: 30.0000 mg | ORAL_CAPSULE | Freq: Every day | ORAL | 0 refills | Status: DC

## 2021-10-13 NOTE — Progress Notes (Signed)
Virtual Video Visit via MyChart Note  I connected with  Elizabeth Ray on 10/13/21 at 11:20 AM EST by the video enabled telemedicine application for MyChart, and verified that I am speaking with the correct person using two identifiers.   I introduced myself as a Publishing rights manager with the practice. We discussed the limitations of evaluation and management by telemedicine and the availability of in person appointments. The patient expressed understanding and agreed to proceed.  Participating parties in this visit include: The patient and the nurse practitioner listed.  The patient is: At home I am: In the office - [facility]  Subjective:    CC: anxiety, ADHD   HPI: Elizabeth Ray is a 21 y.o. year old female presenting today via MyChart today for referral for counseling and ADHD meds.   Patient states that she would like a referral for therapy/counseling for her anxiety/stress-management. States she just feels like it would be good for her to talk to someone. No depression. Denies suicidal/homicidal ideation.   Additionally, now that she has started back working, she feels like she needs to restart her Vyvanse as she is having trouble focusing and completing tasks. She was on Vyvanse 40 mg for awhile, but has been off for at least a year or two.      Past medical history, Surgical history, Family history not pertinant except as noted below, Social history, Allergies, and medications have been entered into the medical record, reviewed, and corrections made.   Review of Systems:  All review of systems negative except what is listed in the HPI   Objective:    General:  Speaking clearly in complete sentences. Absent shortness of breath noted.   Alert and oriented x3.   Normal judgment.  Absent acute distress.   Impression and Recommendations:    1. Anxiety No suicidal/homicidal ideation. Referral placed for counseling.  - Ambulatory  referral to Behavioral Health  2. Attention deficit hyperactivity disorder (ADHD), unspecified ADHD type Restarting at Vyvanse 30 mg since she has been off for awhile. She has been on this in the past with her keppra, but would like her to follow-up with her neurologist. One month supply given, would like her to come back or have virtual visit at that time to follow-up. Patient aware of signs/symptoms requiring further/urgent evaluation.  - lisdexamfetamine (VYVANSE) 30 MG capsule; Take 1 capsule (30 mg total) by mouth daily.  Dispense: 30 capsule; Refill: 0    Follow-up if symptoms worsen or fail to improve.    I discussed the assessment and treatment plan with the patient. The patient was provided an opportunity to ask questions and all were answered. The patient agreed with the plan and demonstrated an understanding of the instructions.   The patient was advised to call back or seek an in-person evaluation if the symptoms worsen or if the condition fails to improve as anticipated.  I spent 20 minutes dedicated to the care of this patient on the date of this encounter to include pre-visit chart review of prior notes and results, face-to-face time with the patient, and post-visit ordering of testing as indicated.   Clayborne Dana, NP

## 2021-12-05 ENCOUNTER — Other Ambulatory Visit (HOSPITAL_COMMUNITY)
Admission: RE | Admit: 2021-12-05 | Discharge: 2021-12-05 | Disposition: A | Discharge status: Home / Self Care | Payer: Medicaid Other | Source: Ambulatory Visit | Attending: Family Medicine | Admitting: Family Medicine

## 2021-12-05 ENCOUNTER — Encounter: Payer: Self-pay | Admitting: Family Medicine

## 2021-12-05 ENCOUNTER — Ambulatory Visit (INDEPENDENT_AMBULATORY_CARE_PROVIDER_SITE_OTHER): Payer: Medicaid Other | Admitting: Family Medicine

## 2021-12-05 VITALS — BP 102/56 | HR 94 | Ht 63.0 in | Wt 160.4 lb

## 2021-12-05 DIAGNOSIS — Z114 Encounter for screening for human immunodeficiency virus [HIV]: Secondary | ICD-10-CM | POA: Diagnosis not present

## 2021-12-05 DIAGNOSIS — G40309 Generalized idiopathic epilepsy and epileptic syndromes, not intractable, without status epilepticus: Secondary | ICD-10-CM

## 2021-12-05 DIAGNOSIS — Z113 Encounter for screening for infections with a predominantly sexual mode of transmission: Secondary | ICD-10-CM | POA: Diagnosis not present

## 2021-12-05 DIAGNOSIS — Z0000 Encounter for general adult medical examination without abnormal findings: Secondary | ICD-10-CM

## 2021-12-05 DIAGNOSIS — N760 Acute vaginitis: Secondary | ICD-10-CM

## 2021-12-05 DIAGNOSIS — Z1159 Encounter for screening for other viral diseases: Secondary | ICD-10-CM | POA: Diagnosis not present

## 2021-12-05 DIAGNOSIS — Z23 Encounter for immunization: Secondary | ICD-10-CM

## 2021-12-05 DIAGNOSIS — B9689 Other specified bacterial agents as the cause of diseases classified elsewhere: Secondary | ICD-10-CM

## 2021-12-05 LAB — COMPREHENSIVE METABOLIC PANEL
ALT: 12 U/L (ref 0–35)
AST: 15 U/L (ref 0–37)
Albumin: 4.7 g/dL (ref 3.5–5.2)
Alkaline Phosphatase: 61 U/L (ref 39–117)
BUN: 8 mg/dL (ref 6–23)
CO2: 29 mEq/L (ref 19–32)
Calcium: 9.8 mg/dL (ref 8.4–10.5)
Chloride: 103 mEq/L (ref 96–112)
Creatinine, Ser: 0.72 mg/dL (ref 0.40–1.20)
GFR: 118.97 mL/min (ref >60.00)
Glucose, Bld: 73 mg/dL (ref 70–99)
Potassium: 4.5 mEq/L (ref 3.5–5.1)
Sodium: 138 mEq/L (ref 135–145)
Total Bilirubin: 0.4 mg/dL (ref 0.2–1.2)
Total Protein: 7.2 g/dL (ref 6.0–8.3)

## 2021-12-05 LAB — LIPID PANEL
Cholesterol: 143 mg/dL (ref 0–200)
HDL: 59.3 mg/dL (ref >39.00)
LDL Cholesterol: 78 mg/dL (ref 0–99)
NonHDL: 84.19
Total CHOL/HDL Ratio: 2
Triglycerides: 31 mg/dL (ref 0.0–149.0)
VLDL: 6.2 mg/dL (ref 0.0–40.0)

## 2021-12-05 LAB — CBC
HCT: 40.2 % (ref 36.0–46.0)
Hemoglobin: 13.1 g/dL (ref 12.0–15.0)
MCHC: 32.5 g/dL (ref 30.0–36.0)
MCV: 90.9 fl (ref 78.0–100.0)
Platelets: 255 10*3/uL (ref 150.0–400.0)
RBC: 4.42 Mil/uL (ref 3.87–5.11)
RDW: 12.4 % (ref 11.5–15.5)
WBC: 11.5 10*3/uL — ABNORMAL HIGH (ref 4.0–10.5)

## 2021-12-05 LAB — POCT URINE PREGNANCY: Preg Test, Ur: NEGATIVE

## 2021-12-05 NOTE — Progress Notes (Signed)
? ?BP (!) 102/56   Pulse 94   Ht 5\' 3"  (1.6 m)   Wt 160 lb 6.4 oz (72.8 kg)   LMP 11/22/2021   BMI 28.41 kg/m?   ? ?Subjective:  ? ? Patient ID: Elizabeth Ray, female    DOB: 2001-08-19, 21 y.o.   MRN: [id number] ? ?HPI: ?Elizabeth Ray is a 21 y.o. female presenting on 12/05/2021 for comprehensive medical examination. Current medical complaints include: none. She is trying to get into the [employer] - will need new neuro referral to [facility] for seizure clearance. ? ?She currently lives with: husband ?Interim Problems from her last visit: no  ? ?She reports regular vision exams q1-5y: yes ?She reports regular dental exams q 24m: yes ?Her diet consists of: regular ?She endorses exercise and/or activity of: 3 days per week, walk/running ?She works at: [employer], [employer] ? ?She denies ETOH use. ?She denies nicotine use. ?She endorses illegal substance use. - marijuana occasionally  ? ? ?She reports regular mensutral periods, occasional up to 1 week late (LMP 11/22/21); Flow is moderate.  ?Current menopausal symptoms: no ?She is currently  sexually active  ?She denies  concerns today about STI - would like general screening today  ?Contraception choices are: none ? ?She denies concerns about skin changes today. ?She denies concerns about bowel changes today. ?She denies concerns about bladder changes today. ? ? Depression Screen done today and results listed below: 0 ? ?  12/05/2021  ? 12:12 PM 10/07/2021  ?  8:42 AM 08/18/2018  ? 10:44 AM 10/31/2017  ?  5:03 PM 10/23/2017  ?  3:54 PM  ?Depression screen PHQ 2/9  ?Decreased Interest 0 0 2 0 0  ?Down, Depressed, Hopeless 0 0 2 0 0  ?PHQ - 2 Score 0 0 4 0 0  ?Altered sleeping   1 0 0  ?Tired, decreased energy   2 0 0  ?Change in appetite   3 0 0  ?Feeling bad or failure about yourself    2 0 0  ?Trouble concentrating   2 0 0  ?Moving slowly or fidgety/restless   1 0 0  ?PHQ-9 Score   15 0 0  ? ? ?She does not have a history of falls.  ? ? ? ? ? ? ?Past Medical History:  ?Past  Medical History:  ?Diagnosis Date  ? ADHD (attention deficit hyperactivity disorder)   ? Anxiety   ? Bipolar and related disorder ([place]) 07/15/2015  ? Depression   ? Mental disorder   ? Seizures ([place])   ? up until age 22  ? ? ?Surgical History:  ?Past Surgical History:  ?Procedure Laterality Date  ? DILATION AND EVACUATION N/A 07/16/2014  ? Procedure: DILATATION AND EVACUATION;  Surgeon: Jonnie Kind, MD;  Location: [facility];  Service: Gynecology;  Laterality: N/A;  ? ? ?Medications:  ?Current Outpatient Medications on File Prior to Visit  ?Medication Sig  ? levETIRAcetam (KEPPRA) 500 MG tablet Take 1 tablet (500 mg total) by mouth 2 (two) times daily.  ? ?No current facility-administered medications on file prior to visit.  ? ? ?Allergies:  ?Allergies  ?Allergen Reactions  ? Metronidazole And Related Other (See Comments)  ?  Seizure  ? Codeine   ? ? ?Social History:  ?Social History  ? ?Socioeconomic History  ? Marital status: Single  ?  Spouse name: Not on file  ? Number of children: Not on file  ? Years of education: Not on file  ?  Highest education level: Not on file  ?Occupational History  ? Not on file  ?Tobacco Use  ? Smoking status: Every Day  ?  Packs/day: 0.50  ?  Types: Cigarettes  ? Smokeless tobacco: Never  ?Substance and Sexual Activity  ? Alcohol use: No  ? Drug use: No  ? Sexual activity: Yes  ?Other Topics Concern  ? Not on file  ?Social History Narrative  ? Not on file  ? ?Social Determinants of Health  ? ?Financial Resource Strain: Not on file  ?Food Insecurity: Not on file  ?Transportation Needs: Not on file  ?Physical Activity: Not on file  ?Stress: Not on file  ?Social Connections: Not on file  ?Intimate Partner Violence: Not on file  ? ?Social History  ? ?Tobacco Use  ?Smoking Status Every Day  ? Packs/day: 0.50  ? Types: Cigarettes  ?Smokeless Tobacco Never  ? ?Social History  ? ?Substance and Sexual Activity  ?Alcohol Use No  ? ? ?Family History:  ?Family History  ?Problem Relation Age of Onset   ? Diabetes Mother   ? Diabetes Other   ? Hypertension Other   ? ? ?Past medical history, surgical history, medications, allergies, family history and social history reviewed with patient today and changes made to appropriate areas of the chart.  ? ?All ROS negative except what is listed above and in the HPI.  ? ?   ?Objective:  ?  ?BP (!) 102/56   Pulse 94   Ht 5\' 3"  (1.6 m)   Wt 160 lb 6.4 oz (72.8 kg)   LMP 11/22/2021   BMI 28.41 kg/m?   ?Wt Readings from Last 3 Encounters:  ?12/05/21 160 lb 6.4 oz (72.8 kg)  ?10/07/21 155 lb (70.3 kg)  ?09/23/21 157 lb (71.2 kg)  ?  ?Physical Exam ?Vitals reviewed.  ?Constitutional:   ?   Appearance: Normal appearance.  ?HENT:  ?   Head: Normocephalic and atraumatic.  ?   Right Ear: Tympanic membrane normal.  ?   Left Ear: Tympanic membrane normal.  ?   Nose: Nose normal.  ?   Mouth/Throat:  ?   Mouth: Mucous membranes are moist.  ?   Pharynx: Oropharynx is clear.  ?Eyes:  ?   Extraocular Movements: Extraocular movements intact.  ?   Conjunctiva/sclera: Conjunctivae normal.  ?   Pupils: Pupils are equal, round, and reactive to light.  ?Cardiovascular:  ?   Rate and Rhythm: Normal rate and regular rhythm.  ?Pulmonary:  ?   Effort: Pulmonary effort is normal.  ?   Breath sounds: Normal breath sounds.  ?Abdominal:  ?   General: Abdomen is flat. Bowel sounds are normal. There is no distension.  ?   Palpations: Abdomen is soft. There is no mass.  ?   Tenderness: There is no abdominal tenderness. There is no guarding or rebound.  ?Genitourinary: ?   Comments: deferred ?Musculoskeletal:     ?   General: Normal range of motion.  ?   Cervical back: Normal range of motion and neck supple. No tenderness.  ?Lymphadenopathy:  ?   Cervical: No cervical adenopathy.  ?Skin: ?   General: Skin is warm and dry.  ?   Capillary Refill: Capillary refill takes less than 2 seconds.  ?Neurological:  ?   General: No focal deficit present.  ?   Mental Status: She is alert and oriented to person,  place, and time.  ?Psychiatric:     ?   Mood and Affect:  Mood normal.     ?   Behavior: Behavior normal.     ?   Thought Content: Thought content normal.     ?   Judgment: Judgment normal.  ? ? ?Results for orders placed or performed in visit on 12/05/21  ?POCT urine pregnancy  ?Result Value Ref Range  ? Preg Test, Ur Negative Negative  ? ?   ?Assessment & Plan:  ? ?Problem List Items Addressed This Visit   ? ?  ? Nervous and Auditory  ? Generalized idiopathic epilepsy and epileptic syndromes, not intractable, without status epilepticus ([place])  ? Relevant Orders  ? Ambulatory referral to Neurology  ? ?Other Visit Diagnoses   ? ? Need for Tdap vaccination    -  Primary  ? Relevant Orders  ? Tdap vaccine greater than or equal to 7yo IM (Completed)  ? Screen for STD (sexually transmitted disease)      ? Relevant Orders  ? HIV Antibody (routine testing w rflx)  ? Hepatitis C antibody  ? Cervicovaginal ancillary only  ? RPR  ? Encounter for screening for HIV      ? Relevant Orders  ? HIV Antibody (routine testing w rflx)  ? Encounter for hepatitis C screening test for low risk patient      ? Relevant Orders  ? Hepatitis C antibody  ? Annual physical exam      ? Relevant Orders  ? CBC  ? Comprehensive metabolic panel  ? Lipid panel  ? TSH  ? HIV Antibody (routine testing w rflx)  ? Hepatitis C antibody  ? Cervicovaginal ancillary only  ? POCT urine pregnancy (Completed)  ? RPR  ? ?  ?  ? ? ? ?LABORATORY TESTING:  ?- Health maintenance labs ordered today as discussed above.  ?         CBC, CMP, LIPIDS ?         TSH - high risk or symptoms ?         A1c - hx, high risk, symptomatic  ?- STI testing: done today; patient requested pregnancy test as well ?- Pap smear: up to date ?- HIV screening: today  ?- Hep C screening: today ?- Syphilis screening: today  ? ? ?IMMUNIZATIONS:   ?- Tdap: Tetanus vaccination status reviewed: Td vaccination indicated and given today. ?- Influenza: Postponed to flu season ?- Pneumovax: Not  applicable ?- Prevnar: Not applicable ?- HPV: Up to date ?- Shingrix vaccine: Not applicable ?- [id number]: Refused ? ?SCREENING: ?- Mammogram: Not applicable ?- Bone Density: Not applicable ?- Colonoscopy: Not applicable  ?D

## 2021-12-05 NOTE — Progress Notes (Signed)
Wants cpe ?Had some cranberry juice this morning ?

## 2021-12-06 ENCOUNTER — Encounter: Payer: Self-pay | Admitting: Family Medicine

## 2021-12-06 LAB — RPR: RPR Ser Ql: NONREACTIVE

## 2021-12-06 LAB — HIV ANTIBODY (ROUTINE TESTING W REFLEX): HIV 1&2 Ab, 4th Generation: NONREACTIVE

## 2021-12-06 LAB — HEPATITIS C ANTIBODY
Hepatitis C Ab: NONREACTIVE
SIGNAL TO CUT-OFF: 0.05 (ref <1.00)

## 2021-12-06 LAB — TSH: TSH: 0.53 u[IU]/mL (ref 0.35–5.50)

## 2021-12-07 ENCOUNTER — Encounter: Payer: Self-pay | Admitting: *Deleted

## 2021-12-08 ENCOUNTER — Encounter: Payer: Self-pay | Admitting: Family Medicine

## 2021-12-08 ENCOUNTER — Telehealth: Payer: Self-pay

## 2021-12-08 LAB — CERVICOVAGINAL ANCILLARY ONLY
Bacterial Vaginitis (gardnerella): POSITIVE — AB
Candida Glabrata: NEGATIVE
Candida Vaginitis: NEGATIVE
Chlamydia: NEGATIVE
Comment: NEGATIVE
Comment: NEGATIVE
Comment: NEGATIVE
Comment: NEGATIVE
Comment: NEGATIVE
Comment: NORMAL
Neisseria Gonorrhea: NEGATIVE
Trichomonas: NEGATIVE

## 2021-12-08 MED ORDER — CLINDAMYCIN HCL 300 MG PO CAPS: 300.0000 mg | ORAL_CAPSULE | Freq: Two times a day (BID) | ORAL | 0 refills | Status: AC

## 2021-12-08 NOTE — Telephone Encounter (Signed)
Patient called wanting to know If she could get treatment sent in for her BV  ?

## 2021-12-08 NOTE — Addendum Note (Signed)
Addended by: Clayborne Dana on: 12/08/2021 09:43 PM ? ? Modules accepted: Orders ? ?

## 2021-12-09 NOTE — Telephone Encounter (Signed)
Pt has been scheduled.  °

## 2021-12-09 NOTE — Telephone Encounter (Signed)
Pt called and notified

## 2021-12-12 ENCOUNTER — Ambulatory Visit: Payer: Medicaid Other | Admitting: Family Medicine

## 2021-12-12 NOTE — Progress Notes (Incomplete)
? ?Subjective:  ? ?By signing my name below, I, Cassell Clement, attest that this documentation has been prepared under the direction and in the presence of Seabron Spates DO, 12/12/2021   ? ? Patient ID: Elizabeth Ray, female    DOB: 10-09-2000, 21 y.o.   MRN: [id number] ? ?No chief complaint on file. ? ? ?HPI ?Patient is in today for an office visit. ? ?Past Medical History:  ?Diagnosis Date  ? ADHD (attention deficit hyperactivity disorder)   ? Anxiety   ? Bipolar and related disorder ([facility]) 07/15/2015  ? Depression   ? Mental disorder   ? Seizures ([facility])   ? up until age 45  ? ? ?Past Surgical History:  ?Procedure Laterality Date  ? DILATION AND EVACUATION N/A 07/16/2014  ? Procedure: DILATATION AND EVACUATION;  Surgeon: Tilda Burrow, MD;  Location: [facility];  Service: Gynecology;  Laterality: N/A;  ? ? ?Family History  ?Problem Relation Age of Onset  ? Diabetes Mother   ? Diabetes Other   ? Hypertension Other   ? ? ?Social History  ? ?Socioeconomic History  ? Marital status: Single  ?  Spouse name: Not on file  ? Number of children: Not on file  ? Years of education: Not on file  ? Highest education level: Not on file  ?Occupational History  ? Not on file  ?Tobacco Use  ? Smoking status: Every Day  ?  Packs/day: 0.50  ?  Types: Cigarettes  ? Smokeless tobacco: Never  ?Substance and Sexual Activity  ? Alcohol use: No  ? Drug use: No  ? Sexual activity: Yes  ?Other Topics Concern  ? Not on file  ?Social History Narrative  ? Not on file  ? ?Social Determinants of Health  ? ?Financial Resource Strain: Not on file  ?Food Insecurity: Not on file  ?Transportation Needs: Not on file  ?Physical Activity: Not on file  ?Stress: Not on file  ?Social Connections: Not on file  ?Intimate Partner Violence: Not on file  ? ? ?Outpatient Medications Prior to Visit  ?Medication Sig Dispense Refill  ? clindamycin (CLEOCIN) 300 MG capsule Take 1 capsule (300 mg total) by mouth in the morning and at bedtime for 7 days. 14 capsule 0   ? levETIRAcetam (KEPPRA) 500 MG tablet Take 1 tablet (500 mg total) by mouth 2 (two) times daily. 60 tablet 0  ? ?No facility-administered medications prior to visit.  ? ? ?Allergies  ?Allergen Reactions  ? Metronidazole And Related Other (See Comments)  ?  Seizure  ? Codeine   ? ? ?ROS ? ?   ?Objective:  ?  ?Physical Exam ?Constitutional:   ?   General: She is not in acute distress. ?   Appearance: Normal appearance. She is not ill-appearing.  ?HENT:  ?   Head: Normocephalic and atraumatic.  ?   Right Ear: External ear normal.  ?   Left Ear: External ear normal.  ?Eyes:  ?   Extraocular Movements: Extraocular movements intact.  ?   Pupils: Pupils are equal, round, and reactive to light.  ?Cardiovascular:  ?   Rate and Rhythm: Normal rate and regular rhythm.  ?   Heart sounds: Normal heart sounds. No murmur heard. ?  No gallop.  ?Pulmonary:  ?   Effort: Pulmonary effort is normal. No respiratory distress.  ?   Breath sounds: Normal breath sounds. No wheezing or rales.  ?Skin: ?   General: Skin is warm and dry.  ?Neurological:  ?  Mental Status: She is alert and oriented to person, place, and time.  ?Psychiatric:     ?   Judgment: Judgment normal.  ? ? ?LMP 11/22/2021  ?Wt Readings from Last 3 Encounters:  ?12/05/21 160 lb 6.4 oz (72.8 kg)  ?10/07/21 155 lb (70.3 kg)  ?09/23/21 157 lb (71.2 kg)  ? ? ?Diabetic Foot Exam - Simple   ?No data filed ?  ? ?Lab Results  ?Component Value Date  ? WBC 11.5 (H) 12/05/2021  ? HGB 13.1 12/05/2021  ? HCT 40.2 12/05/2021  ? PLT 255.0 12/05/2021  ? GLUCOSE 73 12/05/2021  ? CHOL 143 12/05/2021  ? TRIG 31.0 12/05/2021  ? HDL 59.30 12/05/2021  ? LDLCALC 78 12/05/2021  ? ALT 12 12/05/2021  ? AST 15 12/05/2021  ? NA 138 12/05/2021  ? K 4.5 12/05/2021  ? CL 103 12/05/2021  ? CREATININE 0.72 12/05/2021  ? BUN 8 12/05/2021  ? CO2 29 12/05/2021  ? TSH 0.53 12/05/2021  ? HGBA1C 5.5 10/01/2019  ? ? ?Lab Results  ?Component Value Date  ? TSH 0.53 12/05/2021  ? ?Lab Results  ?Component Value  Date  ? WBC 11.5 (H) 12/05/2021  ? HGB 13.1 12/05/2021  ? HCT 40.2 12/05/2021  ? MCV 90.9 12/05/2021  ? PLT 255.0 12/05/2021  ? ?Lab Results  ?Component Value Date  ? NA 138 12/05/2021  ? K 4.5 12/05/2021  ? CO2 29 12/05/2021  ? GLUCOSE 73 12/05/2021  ? BUN 8 12/05/2021  ? CREATININE 0.72 12/05/2021  ? BILITOT 0.4 12/05/2021  ? ALKPHOS 61 12/05/2021  ? AST 15 12/05/2021  ? ALT 12 12/05/2021  ? PROT 7.2 12/05/2021  ? ALBUMIN 4.7 12/05/2021  ? CALCIUM 9.8 12/05/2021  ? ANIONGAP 6 08/18/2018  ? GFR 118.97 12/05/2021  ? ?Lab Results  ?Component Value Date  ? CHOL 143 12/05/2021  ? ?Lab Results  ?Component Value Date  ? HDL 59.30 12/05/2021  ? ?Lab Results  ?Component Value Date  ? LDLCALC 78 12/05/2021  ? ?Lab Results  ?Component Value Date  ? TRIG 31.0 12/05/2021  ? ?Lab Results  ?Component Value Date  ? CHOLHDL 2 12/05/2021  ? ?Lab Results  ?Component Value Date  ? HGBA1C 5.5 10/01/2019  ? ? ?   ?Assessment & Plan:  ? ?Problem List Items Addressed This Visit   ?None ? ? ? ? ?No orders of the defined types were placed in this encounter. ? ? ?I, Cassell Clement, personally preformed the services described in this documentation.  All medical record entries made by the scribe were at my direction and in my presence.  I have reviewed the chart and discharge instructions (if applicable) and agree that the record reflects my personal performance and is accurate and complete. 12/12/2021 ? ? ?I,Amber Collins,acting as a Neurosurgeon for [employer], DO.,have documented all relevant documentation on the behalf of Donato Schultz, DO,as directed by  Donato Schultz, DO while in the presence of Donato Schultz, DO. ? ? ? ?[employer] ? ?

## 2021-12-15 ENCOUNTER — Encounter: Payer: Self-pay | Admitting: Family Medicine

## 2021-12-15 ENCOUNTER — Ambulatory Visit (INDEPENDENT_AMBULATORY_CARE_PROVIDER_SITE_OTHER): Payer: Medicaid Other | Admitting: Family Medicine

## 2021-12-15 VITALS — BP 97/51 | HR 63 | Temp 97.7°F | Ht 63.0 in | Wt 160.0 lb

## 2021-12-15 DIAGNOSIS — J0140 Acute pansinusitis, unspecified: Secondary | ICD-10-CM | POA: Diagnosis not present

## 2021-12-15 MED ORDER — AMOXICILLIN-POT CLAVULANATE 875-125 MG PO TABS: 1.0000 | ORAL_TABLET | Freq: Two times a day (BID) | ORAL | 0 refills | Status: AC

## 2021-12-15 MED ORDER — GUAIFENESIN ER 600 MG PO TB12: 1200.0000 mg | ORAL_TABLET | Freq: Two times a day (BID) | ORAL | 2 refills | Status: DC

## 2021-12-15 MED ORDER — BENZONATATE 200 MG PO CAPS: 200.0000 mg | ORAL_CAPSULE | Freq: Two times a day (BID) | ORAL | 0 refills | Status: DC | PRN

## 2021-12-15 NOTE — Patient Instructions (Signed)
Start antibiotics.  ?Adding mucinex and tessalon. ?Continue supportive measures including rest, hydration, humidifier use, steam showers, warm compresses to sinuses, warm liquids with lemon and honey, and over-the-counter cough, cold, and analgesics as needed.  ? ?Please contact office for follow-up if symptoms do not improve or worsen. Seek emergency care if symptoms become severe. ? ? ? ? ?

## 2021-12-15 NOTE — Progress Notes (Signed)
? ?Acute Office Visit ? ?Subjective:  ? ? Patient ID: Elizabeth Ray, female    DOB: 20-Nov-2000, 21 y.o.   MRN: [id number] ? ?Chief Complaint  ?Patient presents with  ? Cough  ? ? ?Cough ?The current episode started 1 to 4 weeks ago. The cough is Productive of purulent sputum. Associated symptoms include ear congestion, ear pain, nasal congestion, postnasal drip, rhinorrhea and a sore throat. Pertinent negatives include no chest pain, chills, fever, headaches, heartburn, hemoptysis, myalgias, rash, shortness of breath, sweats, weight loss or wheezing.  ?Sinusitis ?This is a new problem. The current episode started 1 to 4 weeks ago. The problem has been rapidly worsening since onset. There has been no fever. Her pain is at a severity of 5/10. The pain is moderate. Associated symptoms include congestion, coughing, ear pain, sinus pressure, sneezing and a sore throat. Pertinent negatives include no chills, diaphoresis, headaches, hoarse voice, neck pain, shortness of breath or swollen glands. Treatments tried: OTC cough/cold medicine. The treatment provided mild relief.  ? ? ? ? ? ? ?Past Medical History:  ?Diagnosis Date  ? ADHD (attention deficit hyperactivity disorder)   ? Anxiety   ? Bipolar and related disorder (HCC) 07/15/2015  ? Depression   ? Mental disorder   ? Seizures (HCC)   ? up until age 68  ? ? ?Past Surgical History:  ?Procedure Laterality Date  ? DILATION AND EVACUATION N/A 07/16/2014  ? Procedure: DILATATION AND EVACUATION;  Surgeon: Tilda BurrowJohn Ferguson V, MD;  Location: [facility];  Service: Gynecology;  Laterality: N/A;  ? ? ?Family History  ?Problem Relation Age of Onset  ? Diabetes Mother   ? Diabetes Other   ? Hypertension Other   ? ? ?Social History  ? ?Socioeconomic History  ? Marital status: Single  ?  Spouse name: Not on file  ? Number of children: Not on file  ? Years of education: Not on file  ? Highest education level: Not on file  ?Occupational History  ? Not on file  ?Tobacco Use  ? Smoking status:  Every Day  ?  Packs/day: 0.50  ?  Types: Cigarettes  ? Smokeless tobacco: Never  ?Substance and Sexual Activity  ? Alcohol use: No  ? Drug use: No  ? Sexual activity: Yes  ?Other Topics Concern  ? Not on file  ?Social History Narrative  ? Not on file  ? ?Social Determinants of Health  ? ?Financial Resource Strain: Not on file  ?Food Insecurity: Not on file  ?Transportation Needs: Not on file  ?Physical Activity: Not on file  ?Stress: Not on file  ?Social Connections: Not on file  ?Intimate Partner Violence: Not on file  ? ? ?Outpatient Medications Prior to Visit  ?Medication Sig Dispense Refill  ? clindamycin (CLEOCIN) 300 MG capsule Take 1 capsule (300 mg total) by mouth in the morning and at bedtime for 7 days. 14 capsule 0  ? levETIRAcetam (KEPPRA) 500 MG tablet Take 1 tablet (500 mg total) by mouth 2 (two) times daily. 60 tablet 0  ? ?No facility-administered medications prior to visit.  ? ? ?Allergies  ?Allergen Reactions  ? Metronidazole And Related Other (See Comments)  ?  Seizure  ? Codeine   ? ? ?Review of Systems ?All review of systems negative except what is listed in the HPI ? ? ?   ?Objective:  ?  ?Physical Exam ?Vitals reviewed.  ?Constitutional:   ?   Appearance: Normal appearance.  ?HENT:  ?  Head: Normocephalic and atraumatic.  ?   Right Ear: Tympanic membrane normal.  ?   Left Ear: Tympanic membrane normal.  ?   Nose: Congestion and rhinorrhea present.  ?   Mouth/Throat:  ?   Mouth: Mucous membranes are moist.  ?   Pharynx: Oropharynx is clear. No oropharyngeal exudate or posterior oropharyngeal erythema.  ?Cardiovascular:  ?   Rate and Rhythm: Normal rate and regular rhythm.  ?Pulmonary:  ?   Effort: Pulmonary effort is normal.  ?   Breath sounds: Normal breath sounds. No wheezing, rhonchi or rales.  ?Musculoskeletal:  ?   Cervical back: Normal range of motion and neck supple.  ?Skin: ?   General: Skin is warm and dry.  ?Neurological:  ?   General: No focal deficit present.  ?   Mental Status:  She is alert and oriented to person, place, and time. Mental status is at baseline.  ?Psychiatric:     ?   Mood and Affect: Mood normal.     ?   Behavior: Behavior normal.     ?   Thought Content: Thought content normal.     ?   Judgment: Judgment normal.  ? ? ?BP (!) 97/51   Pulse 63   Temp 97.7 ?F (36.5 ?C)   Ht 5\' 3"  (1.6 m)   Wt 160 lb (72.6 kg)   LMP 11/22/2021   SpO2 99%   BMI 28.34 kg/m?  ?Wt Readings from Last 3 Encounters:  ?12/15/21 160 lb (72.6 kg)  ?12/05/21 160 lb 6.4 oz (72.8 kg)  ?10/07/21 155 lb (70.3 kg)  ? ? ?Health Maintenance Due  ?Topic Date Due  ? COVID-19 Vaccine (1) Never done  ? ? ?There are no preventive care reminders to display for this patient. ? ? ?Lab Results  ?Component Value Date  ? TSH 0.53 12/05/2021  ? ?Lab Results  ?Component Value Date  ? WBC 11.5 (H) 12/05/2021  ? HGB 13.1 12/05/2021  ? HCT 40.2 12/05/2021  ? MCV 90.9 12/05/2021  ? PLT 255.0 12/05/2021  ? ?Lab Results  ?Component Value Date  ? NA 138 12/05/2021  ? K 4.5 12/05/2021  ? CO2 29 12/05/2021  ? GLUCOSE 73 12/05/2021  ? BUN 8 12/05/2021  ? CREATININE 0.72 12/05/2021  ? BILITOT 0.4 12/05/2021  ? ALKPHOS 61 12/05/2021  ? AST 15 12/05/2021  ? ALT 12 12/05/2021  ? PROT 7.2 12/05/2021  ? ALBUMIN 4.7 12/05/2021  ? CALCIUM 9.8 12/05/2021  ? ANIONGAP 6 08/18/2018  ? GFR 118.97 12/05/2021  ? ?Lab Results  ?Component Value Date  ? CHOL 143 12/05/2021  ? ?Lab Results  ?Component Value Date  ? HDL 59.30 12/05/2021  ? ?Lab Results  ?Component Value Date  ? LDLCALC 78 12/05/2021  ? ?Lab Results  ?Component Value Date  ? TRIG 31.0 12/05/2021  ? ?Lab Results  ?Component Value Date  ? CHOLHDL 2 12/05/2021  ? ?Lab Results  ?Component Value Date  ? HGBA1C 5.5 10/01/2019  ? ? ?   ?Assessment & Plan:  ? ?1. Acute non-recurrent pansinusitis ?Start antibiotics. Education provided.  ?Adding mucinex and tessalon. ?Continue supportive measures including rest, hydration, humidifier use, steam showers, warm compresses to sinuses, warm  liquids with lemon and honey, and over-the-counter cough, cold, and analgesics as needed.  ? ? ?- benzonatate (TESSALON) 200 MG capsule; Take 1 capsule (200 mg total) by mouth 2 (two) times daily as needed for cough.  Dispense: 20 capsule; Refill: 0 ?- amoxicillin-clavulanate (  AUGMENTIN) 875-125 MG tablet; Take 1 tablet by mouth 2 (two) times daily for 10 days.  Dispense: 20 tablet; Refill: 0 ?- guaiFENesin (MUCINEX) 600 MG 12 hr tablet; Take 2 tablets (1,200 mg total) by mouth 2 (two) times daily.  Dispense: 30 tablet; Refill: 2 ? ? ?Please contact office for follow-up if symptoms do not improve or worsen. Seek emergency care if symptoms become severe. ? ? ?Clayborne Dana, NP ? ?

## 2021-12-16 ENCOUNTER — Encounter: Payer: Self-pay | Admitting: Obstetrics and Gynecology

## 2021-12-19 ENCOUNTER — Other Ambulatory Visit: Payer: Self-pay

## 2021-12-19 NOTE — Progress Notes (Signed)
error 

## 2021-12-21 ENCOUNTER — Encounter: Payer: Self-pay | Admitting: Obstetrics & Gynecology

## 2021-12-21 ENCOUNTER — Ambulatory Visit (INDEPENDENT_AMBULATORY_CARE_PROVIDER_SITE_OTHER): Payer: Medicaid Other | Admitting: Obstetrics & Gynecology

## 2021-12-21 ENCOUNTER — Other Ambulatory Visit (HOSPITAL_COMMUNITY)
Admission: RE | Admit: 2021-12-21 | Discharge: 2021-12-21 | Disposition: A | Discharge status: Home / Self Care | Payer: Medicaid Other | Source: Ambulatory Visit | Attending: Obstetrics & Gynecology | Admitting: Obstetrics & Gynecology

## 2021-12-21 VITALS — BP 113/71 | HR 81 | Wt 161.0 lb

## 2021-12-21 DIAGNOSIS — R102 Pelvic and perineal pain unspecified side: Secondary | ICD-10-CM

## 2021-12-21 DIAGNOSIS — Z113 Encounter for screening for infections with a predominantly sexual mode of transmission: Secondary | ICD-10-CM

## 2021-12-21 DIAGNOSIS — N926 Irregular menstruation, unspecified: Secondary | ICD-10-CM | POA: Diagnosis present

## 2021-12-21 MED ORDER — CEFTRIAXONE SODIUM 500 MG IJ SOLR
500.0000 mg | Freq: Once | INTRAMUSCULAR | Status: AC
  Administered 2021-12-21: 500 mg via INTRAMUSCULAR

## 2021-12-21 MED ORDER — AZITHROMYCIN 500 MG PO TABS: 1000.0000 mg | ORAL_TABLET | Freq: Once | ORAL | 1 refills | Status: AC

## 2021-12-21 NOTE — Progress Notes (Signed)
Pt states her period was from 3/29-4/1 and then it started 4/8-4/11. ?Jager Koska l Fawna Cranmer, CMA  ?

## 2021-12-21 NOTE — Progress Notes (Signed)
History:  ?21 y.o. G1P0010 here today for irreg menses. Pt had vaginal bleeding at the beginning of the month and then again last week. Pt reports that she is married but, separated and cannot guarantee that her spouse may have another partner. She wants children in the future and is worried about a possible infxn. Pt denies discharge. She does reports pain on the left side of her adnexa for the past 2 days. No h/o STIs. No fever or chills. 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 Pt denies constitutional sx.  ?  ?The following portions of the patient's history were reviewed and updated as appropriate: allergies, current medications, past family history, past medical history, past social history, past surgical history and problem list. ? ?Review of Systems:  ?Pertinent items are noted in HPI. ?   ?Objective:  ?Physical Exam ?Blood pressure 113/71, pulse 81, weight 161 lb (73 kg), last menstrual period 12/07/2021. ? ?CONSTITUTIONAL: Well-developed, well-nourished female in no acute distress.  ?HENT:  Normocephalic, atraumatic ?EYES: Conjunctivae and EOM are normal. No scleral icterus.  ?NECK: Normal range of motion ?SKIN: Skin is warm and dry. No rash noted. Not diaphoretic.No pallor. ?NEUROLGIC: Alert and oriented to person, place, and time. Normal coordination.   ?Abd: Soft, nontender and nondistended ?Pelvic: Normal appearing external genitalia; normal appearing vaginal mucosa and cervix.  Normal discharge.  +CMT and left adnexal pain. No masses palpated.  Small uterus, no other palpable masses, no uterine or adnexal tenderness ? ? ?Assessment & Plan:  ?Pelvic pain. Presumed PID. Cannot r/o adnexal cyst or mass   ? ?Meklit was seen today for menstrual problem. ? ?Diagnoses and all orders for this visit: ? ?Irregular menstrual bleeding ?-     Cervicovaginal ancillary only( [place]) ?-     US Transvaginal Non-OB; Future ? ?Pelvic pain ?-     cefTRIAXone (ROCEPHIN) injection 500 mg ?-     azithromycin  (ZITHROMAX) 500 MG tablet; Take 2 tablets (1,000 mg total) by mouth once for 1 dose. ? ?Routine screening for STI (sexually transmitted infection) ? ?F/u in 2 weeks or sooner prn  ? ?Shomari Scicchitano L. Harraway-Smith, M.D., FACOG ?  ? ?

## 2021-12-23 LAB — CERVICOVAGINAL ANCILLARY ONLY
Bacterial Vaginitis (gardnerella): NEGATIVE
Candida Glabrata: NEGATIVE
Candida Vaginitis: NEGATIVE
Chlamydia: NEGATIVE
Comment: NEGATIVE
Comment: NEGATIVE
Comment: NEGATIVE
Comment: NEGATIVE
Comment: NEGATIVE
Comment: NORMAL
Neisseria Gonorrhea: NEGATIVE
Trichomonas: NEGATIVE

## 2021-12-29 ENCOUNTER — Ambulatory Visit (HOSPITAL_BASED_OUTPATIENT_CLINIC_OR_DEPARTMENT_OTHER)
Admission: RE | Admit: 2021-12-29 | Discharge: 2021-12-29 | Disposition: A | Discharge status: Home / Self Care | Payer: Medicaid Other | Source: Ambulatory Visit | Attending: Obstetrics & Gynecology | Admitting: Obstetrics & Gynecology

## 2021-12-29 ENCOUNTER — Encounter: Payer: Self-pay | Admitting: Obstetrics & Gynecology

## 2021-12-29 DIAGNOSIS — N926 Irregular menstruation, unspecified: Secondary | ICD-10-CM | POA: Diagnosis present

## 2022-01-04 ENCOUNTER — Telehealth: Payer: Self-pay

## 2022-01-04 NOTE — Telephone Encounter (Signed)
Please review patient messages. Armandina Stammer RN  ?

## 2022-01-04 NOTE — Telephone Encounter (Signed)
-----   Message from Maurine Minister, [place] sent at 01/04/2022  9:25 AM EDT ----- ?Regarding: Patient message ?Patient called wanting to speak with Dr. Eugenie Norrie.  Basically, she's waiting on a response from her.  I told her that it could take 24 to 48 hours before she receives a response.   ? ?

## 2022-01-06 ENCOUNTER — Telehealth: Payer: Self-pay | Admitting: Obstetrics & Gynecology

## 2022-01-09 ENCOUNTER — Ambulatory Visit: Payer: Medicaid Other

## 2022-01-09 ENCOUNTER — Other Ambulatory Visit (HOSPITAL_COMMUNITY)
Admission: RE | Admit: 2022-01-09 | Discharge: 2022-01-09 | Disposition: A | Discharge status: Home / Self Care | Payer: Medicaid Other | Source: Ambulatory Visit | Attending: Obstetrics and Gynecology | Admitting: Obstetrics and Gynecology

## 2022-01-09 VITALS — BP 118/82 | HR 87 | Wt 155.0 lb

## 2022-01-09 DIAGNOSIS — B9689 Other specified bacterial agents as the cause of diseases classified elsewhere: Secondary | ICD-10-CM

## 2022-01-09 DIAGNOSIS — N898 Other specified noninflammatory disorders of vagina: Secondary | ICD-10-CM

## 2022-01-09 NOTE — Progress Notes (Signed)
SUBJECTIVE:  ?22 y.o. female complains of yellow vaginal discharge for 1  week(s). ?Denies abnormal vaginal bleeding or significant pelvic pain or ?fever. No UTI symptoms. Denies history of known exposure to STD. ? ?No LMP recorded. ? ?OBJECTIVE:  ?She appears well, afebrile. ?Urine dipstick: not done. ? ?ASSESSMENT:  ?Vaginal Discharge  ?Vaginal Odor ? ? ?PLAN:  ?GC, chlamydia, trichomonas, BVAG, CVAG probe sent to lab. ?Treatment: To be determined once lab results are received ?ROV prn if symptoms persist or worsen. ? ?Edwen Mclester l Sher Hellinger, CMA   ?

## 2022-01-10 ENCOUNTER — Telehealth: Payer: Self-pay | Admitting: Obstetrics & Gynecology

## 2022-01-10 LAB — CERVICOVAGINAL ANCILLARY ONLY
Bacterial Vaginitis (gardnerella): POSITIVE — AB
Candida Glabrata: NEGATIVE
Candida Vaginitis: NEGATIVE
Chlamydia: NEGATIVE
Comment: NEGATIVE
Comment: NEGATIVE
Comment: NEGATIVE
Comment: NEGATIVE
Comment: NEGATIVE
Comment: NORMAL
Neisseria Gonorrhea: NEGATIVE
Trichomonas: NEGATIVE

## 2022-01-10 NOTE — Telephone Encounter (Signed)
TC to pt on 01/06/2022.  ?Left msg.  ? ?Hartleigh Edmonston L. Harraway-Smith, M.D., FACOG ? ?

## 2022-01-10 NOTE — Telephone Encounter (Signed)
TC to pt.  Pt has one day of discharge. She went to the ofc for a vaginal swab. She did not get the cream that was sent. She reports that the discharge had flecks in it.  ? ?I reviewed the results of  the [place]. ? ?Daja Shuping L. Harraway-Smith, M.D., FACOG ?      ?

## 2022-01-11 ENCOUNTER — Ambulatory Visit (INDEPENDENT_AMBULATORY_CARE_PROVIDER_SITE_OTHER): Payer: Medicaid Other | Admitting: Obstetrics & Gynecology

## 2022-01-11 ENCOUNTER — Telehealth: Payer: Self-pay

## 2022-01-11 ENCOUNTER — Encounter: Payer: Self-pay | Admitting: Obstetrics & Gynecology

## 2022-01-11 VITALS — BP 113/68 | HR 68 | Wt 156.0 lb

## 2022-01-11 DIAGNOSIS — N949 Unspecified condition associated with female genital organs and menstrual cycle: Secondary | ICD-10-CM

## 2022-01-11 DIAGNOSIS — N9489 Other specified conditions associated with female genital organs and menstrual cycle: Secondary | ICD-10-CM

## 2022-01-11 MED ORDER — METRONIDAZOLE 0.75 % VA GEL: 1.0000 | Freq: Every day | VAGINAL | 1 refills | Status: DC

## 2022-01-11 NOTE — Telephone Encounter (Signed)
error 

## 2022-01-11 NOTE — Progress Notes (Signed)
Patient states she used the gel for BV and it was causing burning sensation. Patient states she also started her period. Armandina Stammer RN  ?

## 2022-01-11 NOTE — Addendum Note (Signed)
Addended by: Radene Gunning A on: 01/11/2022 08:18 AM ? ? Modules accepted: Orders ? ?

## 2022-01-11 NOTE — Progress Notes (Signed)
History:  ?21 y.o. G1P0010 here today for eval of burning after using Metrogel. She reports that the sensation is more like a tingling and she thinks that she can cont using it. She was worried that it may have an indirect effect on her fertility. She is on her menses starting today.       ? ?The following portions of the patient's history were reviewed and updated as appropriate: allergies, current medications, past family history, past medical history, past social history, past surgical history and problem list. ? ?Review of Systems:  ?Pertinent items are noted in HPI. ?   ?Objective:  ?Physical Exam ?Blood pressure 113/68, pulse 68, weight 156 lb (70.8 kg), last menstrual period 01/11/2022. ?BP 113/68   Pulse 68   Wt 156 lb (70.8 kg)   LMP 01/11/2022 (Exact Date)   BMI 27.63 kg/m?  ?CONSTITUTIONAL: Well-developed, well-nourished female in no acute distress.  ?HENT:  Normocephalic, atraumatic ?EYES: Conjunctivae and EOM are normal. No scleral icterus.  ?NECK: Normal range of motion ?SKIN: Skin is warm and dry. No rash noted. Not diaphoretic.No pallor. ?NEUROLGIC: Alert and oriented to person, place, and time. Normal coordination.  ?Pelvic: dark red blood noted. Unable to see vaginal tissue.    ? ?Labs and Imaging ?US Transvaginal Non-OB ? ?Addendum Date: 01/04/2022   ?ADDENDUM REPORT: 01/04/2022 12:14 ADDENDUM: In the current study, color Doppler examination of pelvis was performed and not pulsed Doppler examination. Electronically Signed   By: Ernie Avena M.D.   On: 01/04/2022 12:14  ? ?Result Date: 01/04/2022 ?CLINICAL DATA:  Pain left lower quadrant, pelvic pain EXAM: TRANSVAGINAL ULTRASOUND OF PELVIS DOPPLER ULTRASOUND OF OVARIES TECHNIQUE: Transvaginal ultrasound examination of the pelvis was performed including evaluation of the uterus, ovaries, adnexal regions, and pelvic cul-de-sac. Color and duplex Doppler ultrasound was utilized to evaluate blood flow to the ovaries. COMPARISON:  Ob sonogram  done on 07/15/2014 FINDINGS: Uterus Measurements: 7.2 x 3.3 x 5.3 cm = volume: 65.4 mL. No fibroids or other mass visualized. Endometrium Thickness: 7.8 mm.  No focal abnormality visualized. Right ovary Measurements: 3.2 x 2.2 x 2.1 cm = volume: 7.7 mL. Normal appearance/no adnexal mass. Left ovary Measurements: 2.9 x 1.3 x 1.9 cm = volume: 3.6 mL. Normal appearance/no adnexal mass. Pulsed Doppler evaluation demonstrates normal low-resistance arterial and venous waveforms in both ovaries. Other findings: Trace amount of free fluid is seen in the cul-de-sac. IMPRESSION: Trace amount of free fluid in the pelvis may suggest recent rupture of ovarian cyst or follicle. Pelvic sonogram is otherwise unremarkable. Electronically Signed: By: Ernie Avena M.D. On: 12/29/2021 10:23   ? ?Assessment & Plan:  ?Diagnoses and all orders for this visit: ? ?Vaginal burning ? ?Reassurance given.  ?Reviewed [place] results.  ?F/u prn  ? ?Neeley Sedivy L. Harraway-Smith, M.D., FACOG ? ?   ? ?

## 2022-01-14 ENCOUNTER — Emergency Department (HOSPITAL_BASED_OUTPATIENT_CLINIC_OR_DEPARTMENT_OTHER)
Admission: EM | Admit: 2022-01-14 | Discharge: 2022-01-14 | Disposition: A | Discharge status: Home / Self Care | Payer: Medicaid Other | Attending: Emergency Medicine | Admitting: Emergency Medicine

## 2022-01-14 ENCOUNTER — Other Ambulatory Visit: Payer: Self-pay

## 2022-01-14 ENCOUNTER — Encounter (HOSPITAL_BASED_OUTPATIENT_CLINIC_OR_DEPARTMENT_OTHER): Payer: Self-pay | Admitting: Emergency Medicine

## 2022-01-14 DIAGNOSIS — K5900 Constipation, unspecified: Secondary | ICD-10-CM | POA: Diagnosis not present

## 2022-01-14 DIAGNOSIS — R1030 Lower abdominal pain, unspecified: Secondary | ICD-10-CM | POA: Diagnosis present

## 2022-01-14 DIAGNOSIS — R102 Pelvic and perineal pain: Secondary | ICD-10-CM

## 2022-01-14 LAB — CBC WITH DIFFERENTIAL/PLATELET
Abs Immature Granulocytes: 0.03 10*3/uL (ref 0.00–0.07)
Basophils Absolute: 0.1 10*3/uL (ref 0.0–0.1)
Basophils Relative: 0 %
Eosinophils Absolute: 0 10*3/uL (ref 0.0–0.5)
Eosinophils Relative: 0 %
HCT: 40.4 % (ref 36.0–46.0)
Hemoglobin: 13.5 g/dL (ref 12.0–15.0)
Immature Granulocytes: 0 %
Lymphocytes Relative: 12 %
Lymphs Abs: 1.4 10*3/uL (ref 0.7–4.0)
MCH: 29.7 pg (ref 26.0–34.0)
MCHC: 33.4 g/dL (ref 30.0–36.0)
MCV: 89 fL (ref 80.0–100.0)
Monocytes Absolute: 0.4 10*3/uL (ref 0.1–1.0)
Monocytes Relative: 4 %
Neutro Abs: 9.3 10*3/uL — ABNORMAL HIGH (ref 1.7–7.7)
Neutrophils Relative %: 84 %
Platelets: 297 10*3/uL (ref 150–400)
RBC: 4.54 MIL/uL (ref 3.87–5.11)
RDW: 12 % (ref 11.5–15.5)
WBC: 11.2 10*3/uL — ABNORMAL HIGH (ref 4.0–10.5)
nRBC: 0 % (ref 0.0–0.2)

## 2022-01-14 LAB — BASIC METABOLIC PANEL
Anion gap: 10 (ref 5–15)
BUN: 8 mg/dL (ref 6–20)
CO2: 24 mmol/L (ref 22–32)
Calcium: 9.5 mg/dL (ref 8.9–10.3)
Chloride: 104 mmol/L (ref 98–111)
Creatinine, Ser: 0.8 mg/dL (ref 0.44–1.00)
GFR, Estimated: >60 mL/min (ref >60)
Glucose, Bld: 116 mg/dL — ABNORMAL HIGH (ref 70–99)
Potassium: 3.8 mmol/L (ref 3.5–5.1)
Sodium: 138 mmol/L (ref 135–145)

## 2022-01-14 LAB — URINALYSIS, ROUTINE W REFLEX MICROSCOPIC
Bilirubin Urine: NEGATIVE
Glucose, UA: NEGATIVE mg/dL
Hgb urine dipstick: NEGATIVE
Ketones, ur: NEGATIVE mg/dL
Leukocytes,Ua: NEGATIVE
Nitrite: NEGATIVE
Protein, ur: NEGATIVE mg/dL
Specific Gravity, Urine: 1.025 (ref 1.005–1.030)
pH: 6.5 (ref 5.0–8.0)

## 2022-01-14 LAB — PREGNANCY, URINE: Preg Test, Ur: NEGATIVE

## 2022-01-14 MED ORDER — POLYETHYLENE GLYCOL 3350 17 G PO PACK
17.0000 g | PACK | Freq: Every day | ORAL | Status: DC
  Administered 2022-01-14: 17 g via ORAL
  Filled 2022-01-14: qty 1

## 2022-01-14 MED ORDER — POLYETHYLENE GLYCOL 3350 17 G PO PACK: 17.0000 g | PACK | Freq: Every day | ORAL | 0 refills | Status: DC

## 2022-01-14 MED ORDER — DIPHENHYDRAMINE HCL 25 MG PO CAPS
25.0000 mg | ORAL_CAPSULE | Freq: Once | ORAL | Status: AC
  Administered 2022-01-14: 25 mg via ORAL
  Filled 2022-01-14: qty 1

## 2022-01-14 NOTE — ED Triage Notes (Addendum)
Went to doctor on Thursday was told that she has bacterial vaginosis. Was given medication that she was allergic to. Once she took medication got metallic taste in mouth. Was on her menstrual cycle it stop.State that she had a dark vaginal discharge. ?

## 2022-01-14 NOTE — Discharge Instructions (Addendum)
We saw in the ER for lower abdominal pain and change in the color, Character of the discharge. ? ?We recommend that you discontinue topical metronidazole.  If your symptoms of discharge get worse, call the gynecology clinic to see if they can give you alternative medication given your allergic reaction to it. ? ?Take the medications prescribed for constipation.  Ensure that you have increased water intake. ? ?Return to the ER if you start having fevers, worsening abdominal pain, worsening vaginal bleeding. ?We recommend pelvic rest (no intercourse), for 1 week or until you are better. ? ?

## 2022-01-14 NOTE — ED Provider Notes (Signed)
?[facility] EMERGENCY DEPARTMENT ?Provider Note ? ? ?CSN: [id number] ?Arrival date & time: 01/14/22  0800 ? ?  ? ?History ? ?Chief Complaint  ?Patient presents with  ? Allergic Reaction  ? ? ?Elizabeth Ray is a 21 y.o. female. ? ?HPI ? ?  ? ?21 year old female comes in with chief complaint of allergic reaction.  Patient indicates that she had gone and seen her gynecologist recently.  She had pelvic exam which included STI evaluation for vaginal discharge.  She was started on metronidazole topical for her BV.  The same day she started her period.  Her period is lasting a little bit longer this time around and she is noticing dark, tarry looking discharge along with blood.  Patient's pain is located over the lower abdominal quadrant, and it has been persistent ? ?Home Medications ?Prior to Admission medications   ?Medication Sig Start Date End Date Taking? Authorizing Provider  ?benzonatate (TESSALON) 200 MG capsule Take 1 capsule (200 mg total) by mouth 2 (two) times daily as needed for cough. 12/15/21  Yes Clayborne Dana, NP  ?guaiFENesin (MUCINEX) 600 MG 12 hr tablet Take 2 tablets (1,200 mg total) by mouth 2 (two) times daily. 12/15/21  Yes Clayborne Dana, NP  ?levETIRAcetam (KEPPRA) 500 MG tablet Take 1 tablet (500 mg total) by mouth 2 (two) times daily. 10/07/21  Yes Clayborne Dana, NP  ?metroNIDAZOLE (METROGEL) 0.75 % vaginal gel Place 1 Applicatorful vaginally at bedtime. Apply one applicatorful to vagina at bedtime for 5 days 01/11/22  Yes Milas Hock, MD  ?polyethylene glycol (MIRALAX / GLYCOLAX) 17 g packet Take 17 g by mouth daily. 01/14/22  Yes Derwood Kaplan, MD  ?   ? ?Allergies    ?Metronidazole and related and Codeine   ? ?Review of Systems   ?Review of Systems  ?All other systems reviewed and are negative. ? ?Physical Exam ?Updated Vital Signs ?BP 124/78   Pulse 66   Temp 97.8 ?F (36.6 ?C) (Oral)   Resp 16   Ht 5\' 2"  (1.575 m)   Wt 70.3 kg   LMP 01/11/2022 (Exact Date)   SpO2 100%   BMI  28.35 kg/m?  ?Physical Exam ?Vitals and nursing note reviewed.  ?Constitutional:   ?   Appearance: She is well-developed.  ?HENT:  ?   Head: Atraumatic.  ?Cardiovascular:  ?   Rate and Rhythm: Normal rate.  ?Pulmonary:  ?   Effort: Pulmonary effort is normal.  ?Abdominal:  ?   Tenderness: There is abdominal tenderness. There is no guarding or rebound.  ?   Comments: Suprapubic tenderness without rebound or guarding  ?Musculoskeletal:  ?   Cervical back: Normal range of motion and neck supple.  ?Skin: ?   General: Skin is warm and dry.  ?Neurological:  ?   Mental Status: She is alert and oriented to person, place, and time.  ? ? ?ED Results / Procedures / Treatments   ?Labs ?(all labs ordered are listed, but only abnormal results are displayed) ?Labs Reviewed  ?BASIC METABOLIC PANEL - Abnormal; Notable for the following components:  ?    Result Value  ? Glucose, Bld 116 (*)   ? All other components within normal limits  ?CBC WITH DIFFERENTIAL/PLATELET - Abnormal; Notable for the following components:  ? WBC 11.2 (*)   ? Neutro Abs 9.3 (*)   ? All other components within normal limits  ?URINALYSIS, ROUTINE W REFLEX MICROSCOPIC - Abnormal; Notable for the following components:  ?  APPearance CLOUDY (*)   ? All other components within normal limits  ?PREGNANCY, URINE  ? ? ?EKG ?None ? ?Radiology ?No results found. ? ?Procedures ?Procedures  ? ? ?Medications Ordered in ED ?Medications  ?polyethylene glycol (MIRALAX / GLYCOLAX) packet 17 g (17 g Oral Given 01/14/22 0929)  ?diphenhydrAMINE (BENADRYL) capsule 25 mg (25 mg Oral Given 01/14/22 0930)  ? ? ?ED Course/ Medical Decision Making/ A&P ?Clinical Course as of 01/14/22 1123  ?Sat Jan 14, 2022  ?1122 Patient reassessed. ?Lab residuals interpreted independently.  She has UA that is negative for any inflammatory or infectious markers.  She has metabolic profile which is normal.  CBC shows no leukocytosis.  Pregnancy test is negative. ? ?Blood work-up is reassuring. ? ?Patient  reexamined.  She states that she feels better.  Her suspicion is that constipation might be contributing.  She is comfortable with outpatient follow-up with her PCP as needed and return to the ER if her symptoms get worse. [AN]  ?  ?Clinical Course User Index ?[AN] Derwood Kaplan, MD  ? ?                        ?Medical Decision Making ?Amount and/or Complexity of Data Reviewed ?Labs: ordered. ? ? ?This patient presents to the ED with chief complaint(s) of lower quadrant abdominal pain, different vaginal discharge than her normal and questionable allergic reaction to topical metronidazole and constipation with pertinent past medical history of recent GYN visit.  Recent ultrasound.  Recent pelvic exam. ? ?The differential diagnosis includes : ?Pelvic congestion syndrome, ovarian cyst, constipation causing pain, diverticulitis, bladder spasms. ? ?The initial plan is to order basic blood work-up, urine analysis and reassess the patient. ? ?Given that patient had a normal pelvic ultrasound on 12-29-2021, I do not think a repeat ultrasound is indicated. ? ?Patient has no peritonitis and has mostly suprapubic and lower quadrant abdominal pain, I do not think CT scan is indicated unless the white count is significantly elevated. ? ?She had pelvic exam and STI testing done last time she was in the ER, I have visualized and interpreted those results as well.  Based on that finding, I do not think a repeat pelvic exam is indicated. ? ? ?Additional history obtained: ?Records reviewed Primary Care Documents ? ? ?Treatment and Reassessment: ?Patient reassessed.  She is feeling better. ? ?Final Clinical Impression(s) / ED Diagnoses ?Final diagnoses:  ?Pelvic pain  ?Constipation, unspecified constipation type  ? ? ?Rx / DC Orders ?ED Discharge Orders   ? ?      Ordered  ?  polyethylene glycol (MIRALAX / GLYCOLAX) 17 g packet  Daily       ? 01/14/22 1121  ? ?  ?  ? ?  ? ? ?  ?Derwood Kaplan, MD ?01/14/22 1136 ? ?

## 2022-01-14 NOTE — ED Notes (Signed)
ED Provider at bedside. 

## 2022-01-25 ENCOUNTER — Ambulatory Visit: Payer: Medicaid Other | Admitting: Family Medicine

## 2022-01-26 ENCOUNTER — Encounter: Payer: Self-pay | Admitting: General Practice

## 2022-01-26 ENCOUNTER — Ambulatory Visit: Payer: Medicaid Other | Admitting: Family Medicine

## 2022-01-26 ENCOUNTER — Encounter: Payer: Self-pay | Admitting: Family Medicine

## 2022-01-26 ENCOUNTER — Ambulatory Visit (INDEPENDENT_AMBULATORY_CARE_PROVIDER_SITE_OTHER): Payer: Medicaid Other | Admitting: Family Medicine

## 2022-01-26 ENCOUNTER — Other Ambulatory Visit (HOSPITAL_COMMUNITY)
Admission: RE | Admit: 2022-01-26 | Discharge: 2022-01-26 | Disposition: A | Discharge status: Home / Self Care | Payer: Medicaid Other | Source: Ambulatory Visit | Attending: Family Medicine | Admitting: Family Medicine

## 2022-01-26 VITALS — BP 120/76 | HR 71 | Wt 154.0 lb

## 2022-01-26 DIAGNOSIS — Z01812 Encounter for preprocedural laboratory examination: Secondary | ICD-10-CM | POA: Diagnosis not present

## 2022-01-26 DIAGNOSIS — Z3009 Encounter for other general counseling and advice on contraception: Secondary | ICD-10-CM

## 2022-01-26 DIAGNOSIS — Z30017 Encounter for initial prescription of implantable subdermal contraceptive: Secondary | ICD-10-CM

## 2022-01-26 DIAGNOSIS — N898 Other specified noninflammatory disorders of vagina: Secondary | ICD-10-CM

## 2022-01-26 LAB — POCT URINE PREGNANCY: Preg Test, Ur: NEGATIVE

## 2022-01-26 MED ORDER — FLUCONAZOLE 150 MG PO TABS: 150.0000 mg | ORAL_TABLET | Freq: Once | ORAL | 3 refills | Status: AC

## 2022-01-26 MED ORDER — ETONOGESTREL 68 MG ~~LOC~~ IMPL
68.0000 mg | DRUG_IMPLANT | Freq: Once | SUBCUTANEOUS | Status: AC
  Administered 2022-01-26: 68 mg via SUBCUTANEOUS

## 2022-01-26 NOTE — Progress Notes (Signed)
   Subjective:    Patient ID: Elizabeth Ray, female    DOB: 2001-05-22, 21 y.o.   MRN: [id number]  HPI  General irritation and clumpy white vaginal discharge.  She has been on MetroGel for BV.  Additionally, she would like to be on birth control.  She has used the Nexplanon in the past I did fairly well with this, although she did not have a period for a couple of cycles and was nervous that this was harmful and had the Nexplanon taken out.  Review of Systems     Objective:   Physical Exam Vitals reviewed. Exam conducted with a chaperone present.  Constitutional:      Appearance: Normal appearance.  Abdominal:     Hernia: There is no hernia in the left inguinal area or right inguinal area.  Genitourinary:    Labia:        Right: No rash or tenderness.        Left: No rash or tenderness.      Vagina: No signs of injury. Vaginal discharge (thick white) present. No tenderness.     Cervix: Normal.  Lymphadenopathy:     Lower Body: No right inguinal adenopathy. No left inguinal adenopathy.  Neurological:     Mental Status: She is alert.  Psychiatric:        Mood and Affect: Mood normal.        Behavior: Behavior normal.        Thought Content: Thought content normal.       Assessment & Plan:  1. Vaginal irritation Diflucan prescribed.  Discussed how to take and can retake in 3 days if still having symptoms. - Cervicovaginal ancillary only( [place])  2. Encounter for initial prescription of Nexplanon Discussed that missing period is normal and fine while on birth control pills or birth control device.  Discussed options.  Patient would like to have Nexplanon placed. - POCT urine pregnancy - etonogestrel (NEXPLANON) implant 68 mg

## 2022-01-26 NOTE — Progress Notes (Signed)
Nexplanon Insertion:  Patient given informed consent, signed copy in the chart, time out was performed. Pregnancy test was negative. Appropriate time out taken.  Patient's left arm was prepped and draped in the usual sterile fashion.. The ruler used to measure and mark insertion area.  Pt was prepped with alcohol swab and then injected with 5 cc of 2% lidocaine with epinephrine.  Pt was prepped with betadine, Implanon removed form packaging,  Device confirmed in needle, then inserted full length of needle and withdrawn per handbook instructions.  Device palpated by physician and patient.  Pt insertion site covered with pressure dressing.   Minimal blood loss.  Pt tolerated the procedure well.    

## 2022-01-30 ENCOUNTER — Emergency Department (HOSPITAL_BASED_OUTPATIENT_CLINIC_OR_DEPARTMENT_OTHER)
Admission: EM | Admit: 2022-01-30 | Discharge: 2022-01-30 | Disposition: A | Discharge status: Home / Self Care | Payer: Medicaid Other | Attending: Emergency Medicine | Admitting: Emergency Medicine

## 2022-01-30 ENCOUNTER — Other Ambulatory Visit: Payer: Self-pay

## 2022-01-30 ENCOUNTER — Encounter (HOSPITAL_BASED_OUTPATIENT_CLINIC_OR_DEPARTMENT_OTHER): Payer: Self-pay | Admitting: Emergency Medicine

## 2022-01-30 DIAGNOSIS — R1031 Right lower quadrant pain: Secondary | ICD-10-CM | POA: Diagnosis present

## 2022-01-30 DIAGNOSIS — R102 Pelvic and perineal pain: Secondary | ICD-10-CM | POA: Insufficient documentation

## 2022-01-30 LAB — URINALYSIS, MICROSCOPIC (REFLEX)

## 2022-01-30 LAB — URINALYSIS, ROUTINE W REFLEX MICROSCOPIC
Bilirubin Urine: NEGATIVE
Glucose, UA: NEGATIVE mg/dL
Ketones, ur: NEGATIVE mg/dL
Leukocytes,Ua: NEGATIVE
Nitrite: NEGATIVE
Protein, ur: NEGATIVE mg/dL
Specific Gravity, Urine: >=1.03 (ref 1.005–1.030)
pH: 6 (ref 5.0–8.0)

## 2022-01-30 LAB — COMPREHENSIVE METABOLIC PANEL
ALT: 17 U/L (ref 0–44)
AST: 19 U/L (ref 15–41)
Albumin: 4.2 g/dL (ref 3.5–5.0)
Alkaline Phosphatase: 61 U/L (ref 38–126)
Anion gap: 5 (ref 5–15)
BUN: 7 mg/dL (ref 6–20)
CO2: 25 mmol/L (ref 22–32)
Calcium: 9.2 mg/dL (ref 8.9–10.3)
Chloride: 108 mmol/L (ref 98–111)
Creatinine, Ser: 0.77 mg/dL (ref 0.44–1.00)
GFR, Estimated: >60 mL/min (ref >60)
Glucose, Bld: 94 mg/dL (ref 70–99)
Potassium: 3.3 mmol/L — ABNORMAL LOW (ref 3.5–5.1)
Sodium: 138 mmol/L (ref 135–145)
Total Bilirubin: 1 mg/dL (ref 0.3–1.2)
Total Protein: 7.3 g/dL (ref 6.5–8.1)

## 2022-01-30 LAB — WET PREP, GENITAL
Clue Cells Wet Prep HPF POC: NONE SEEN
Sperm: NONE SEEN
Trich, Wet Prep: NONE SEEN
WBC, Wet Prep HPF POC: <10 (ref <10)
Yeast Wet Prep HPF POC: NONE SEEN

## 2022-01-30 LAB — CBC
HCT: 41.3 % (ref 36.0–46.0)
Hemoglobin: 13.8 g/dL (ref 12.0–15.0)
MCH: 29.6 pg (ref 26.0–34.0)
MCHC: 33.4 g/dL (ref 30.0–36.0)
MCV: 88.4 fL (ref 80.0–100.0)
Platelets: 253 10*3/uL (ref 150–400)
RBC: 4.67 MIL/uL (ref 3.87–5.11)
RDW: 12.1 % (ref 11.5–15.5)
WBC: 7.6 10*3/uL (ref 4.0–10.5)
nRBC: 0 % (ref 0.0–0.2)

## 2022-01-30 LAB — LIPASE, BLOOD: Lipase: 24 U/L (ref 11–51)

## 2022-01-30 LAB — PREGNANCY, URINE: Preg Test, Ur: NEGATIVE

## 2022-01-30 NOTE — ED Provider Notes (Signed)
I received the patient in signout from Dr. Madilyn Hook, briefly the patient is a 21 year old female with a chief complaints of lower pelvic discomfort.  Awaiting wet prep.  Wet prep is resulted and is negative.  Will discharge home.   Melene Plan, DO 01/30/22 0730

## 2022-01-30 NOTE — ED Triage Notes (Signed)
RLQ pain x 1 week. Reports nausea that started today. Denies fever, vomiting or diarrhea. States hx of ovarian cyst.

## 2022-01-30 NOTE — ED Provider Notes (Signed)
[facility] EMERGENCY DEPARTMENT Provider Note   CSN: [id number] Arrival date & time: 01/30/22  0533     History  Chief Complaint  Patient presents with   Abdominal Pain    Elizabeth Ray is a 21 y.o. female.  The history is provided by the patient.  Abdominal Pain Elizabeth Ray is a 21 y.o. female who presents to the Emergency Department complaining of abdominal pain.  She presents to the ED for evaluation of RLQ abdominal pain that has been present for the last week.  Pain comes and goes and is described as an ache/tension.  No clear alleviating or worsening factors.  Has nausea.  Has a hx/o ovarian cyst - does not exactly feel like prior cyst.   No fever, dysuria, diarrhea.  Currently has a yeast infection - started Thursday - white discharge - prescribed oral diflucan - took a one time dose but has not fully cleared up.    Has a hx/o seizure d/o - takes keppra.   Lmp 01/02/22.  No new sexual partners.       Home Medications Prior to Admission medications   Medication Sig Start Date End Date Taking? Authorizing Provider  benzonatate (TESSALON) 200 MG capsule Take 1 capsule (200 mg total) by mouth 2 (two) times daily as needed for cough. Patient not taking: Reported on 01/26/2022 12/15/21   Clayborne Dana, NP  guaiFENesin (MUCINEX) 600 MG 12 hr tablet Take 2 tablets (1,200 mg total) by mouth 2 (two) times daily. Patient not taking: Reported on 01/26/2022 12/15/21   Clayborne Dana, NP  levETIRAcetam (KEPPRA) 500 MG tablet Take 1 tablet (500 mg total) by mouth 2 (two) times daily. 10/07/21   Clayborne Dana, NP  metroNIDAZOLE (METROGEL) 0.75 % vaginal gel Place 1 Applicatorful vaginally at bedtime. Apply one applicatorful to vagina at bedtime for 5 days Patient not taking: Reported on 01/26/2022 01/11/22   Milas Hock, MD  polyethylene glycol (MIRALAX / GLYCOLAX) 17 g packet Take 17 g by mouth daily. Patient not taking: Reported on 01/26/2022 01/14/22   Derwood Kaplan, MD       Allergies    Metronidazole and related and Codeine    Review of Systems   Review of Systems  Gastrointestinal:  Positive for abdominal pain.  All other systems reviewed and are negative.  Physical Exam Updated Vital Signs BP 120/77 (BP Location: Right Arm)   Pulse 70   Temp 98 F (36.7 C) (Oral)   Resp 20   LMP 01/02/2022 (Exact Date)   SpO2 100%  Physical Exam Vitals and nursing note reviewed.  Constitutional:      Appearance: She is well-developed.  HENT:     Head: Normocephalic and atraumatic.  Cardiovascular:     Rate and Rhythm: Normal rate and regular rhythm.  Pulmonary:     Effort: Pulmonary effort is normal. No respiratory distress.  Abdominal:     Palpations: Abdomen is soft.     Tenderness: There is no guarding or rebound.     Comments: Minimal lower abdomen  Genitourinary:    Comments: No vaginal discharge.  No CMT.  Os closed Musculoskeletal:        General: No tenderness.  Skin:    General: Skin is warm and dry.  Neurological:     Mental Status: She is alert and oriented to person, place, and time.  Psychiatric:        Behavior: Behavior normal.    ED Results / Procedures /  Treatments   Labs (all labs ordered are listed, but only abnormal results are displayed) Labs Reviewed  COMPREHENSIVE METABOLIC PANEL - Abnormal; Notable for the following components:      Result Value   Potassium 3.3 (*)    All other components within normal limits  URINALYSIS, ROUTINE W REFLEX MICROSCOPIC - Abnormal; Notable for the following components:   APPearance HAZY (*)    Hgb urine dipstick TRACE (*)    All other components within normal limits  URINALYSIS, MICROSCOPIC (REFLEX) - Abnormal; Notable for the following components:   Bacteria, UA FEW (*)    All other components within normal limits  WET PREP, GENITAL  LIPASE, BLOOD  CBC  PREGNANCY, URINE  GC/CHLAMYDIA PROBE AMP ([place]) NOT AT [facility]    EKG None  Radiology No results  found.  Procedures Procedures    Medications Ordered in ED Medications - No data to display  ED Course/ Medical Decision Making/ A&P                           Medical Decision Making Amount and/or Complexity of Data Reviewed Labs: ordered.   Pt here for evaluation of pelvic discomfort.  Abdominal examination is benign.  She did recently have vaginal discharge and was treated for candidiasis.  Pelvic examination is benign.  Current clinical picture is not consistent with torsion, tubo-ovarian abscess, appendicitis.  No clinical evidence of candidiasis on examination.  Discussed with patient home care for pelvic discomfort with OTC analgesics.  Discussed outpatient follow-up and return precautions.        Final Clinical Impression(s) / ED Diagnoses Final diagnoses:  Pelvic pain in female    Rx / DC Orders ED Discharge Orders     None         Tilden Fossa, MD 01/30/22 [phone]

## 2022-01-30 NOTE — ED Notes (Signed)
Pelvic cart at bedside. 

## 2022-01-31 LAB — GC/CHLAMYDIA PROBE AMP (~~LOC~~) NOT AT ARMC
Chlamydia: NEGATIVE
Comment: NEGATIVE
Comment: NORMAL
Neisseria Gonorrhea: NEGATIVE

## 2022-01-31 LAB — CERVICOVAGINAL ANCILLARY ONLY
Bacterial Vaginitis (gardnerella): NEGATIVE
Candida Glabrata: NEGATIVE
Candida Vaginitis: NEGATIVE
Chlamydia: NEGATIVE
Comment: NEGATIVE
Comment: NEGATIVE
Comment: NEGATIVE
Comment: NEGATIVE
Comment: NEGATIVE
Comment: NORMAL
Neisseria Gonorrhea: NEGATIVE
Trichomonas: NEGATIVE

## 2022-02-08 ENCOUNTER — Telehealth: Payer: Self-pay

## 2022-02-08 NOTE — Telephone Encounter (Signed)
Nurse Assessment Nurse: Files, RN, Dustin Date/Time (Eastern Time): 02/07/2022 5:17:29 PM Confirm and document reason for call. If symptomatic, describe symptoms. ---Caller states she been having a cold for past couple of days, cough started on Friday on Friday. chest discomfort. Does the patient have any new or worsening symptoms? ---Yes Will a triage be completed? ---Yes Related visit to physician within the last 2 weeks? ---Yes Does the PT have any chronic conditions? (i.e. diabetes, asthma, this includes High risk factors for pregnancy, etc.) ---Yes List chronic conditions. ---Seizure Is the patient pregnant or possibly pregnant? (Ask all females between the ages of 26-55) ---No Is this a behavioral health or substance abuse call? ---No Guidelines Guideline Title Affirmed Question Affirmed Notes Nurse Date/Time (Eastern Time) Cough - Acute Productive Coughing up rustycolored (reddishbrown) sputum Files, RN, Amalia Hailey 02/07/2022 5:18:56 PM Disp. Time Lamount Cohen Time) Disposition Final User PLEASE NOTE: All timestamps contained within this report are represented as [place] Standard Time. CONFIDENTIALTY NOTICE: This fax transmission is intended only for the addressee. It contains information that is legally privileged, confidential or otherwise protected from use or disclosure. If you are not the intended recipient, you are strictly prohibited from reviewing, disclosing, copying using or disseminating any of this information or taking any action in reliance on or regarding this information. If you have received this fax in error, please notify us immediately by telephone so that we can arrange for its return to [place]. Phone: [phone], Toll-Free: [phone], Fax: [phone] Page: 2 of 2 Call Id: [id number] 02/07/2022 5:21:40 PM See PCP within 24 Hours Yes Files, RN, Mahlon Gammon Disagree/Comply Comply Caller Understands Yes PreDisposition InappropriateToAsk Care Advice Given Per  Guideline SEE PCP WITHIN 24 HOURS: * IF OFFICE WILL BE OPEN: You need to be examined within the next 24 hours. Call your doctor (or NP/PA) when the office opens and make an appointment. CALL BACK IF: * Difficulty breathing occurs * You become worse CARE ADVICE given per Cough - Acute Productive (Adult) guideline. Referrals REFERRED TO PCP OFFICE

## 2022-02-08 NOTE — Telephone Encounter (Signed)
Called pt. She states she has gone to urgent care.

## 2022-02-20 ENCOUNTER — Emergency Department (HOSPITAL_BASED_OUTPATIENT_CLINIC_OR_DEPARTMENT_OTHER)
Admission: EM | Admit: 2022-02-20 | Discharge: 2022-02-20 | Discharge status: Against Medical Advice | Payer: Medicaid Other | Attending: Emergency Medicine | Admitting: Emergency Medicine

## 2022-02-20 ENCOUNTER — Other Ambulatory Visit: Payer: Self-pay

## 2022-02-20 ENCOUNTER — Encounter (HOSPITAL_BASED_OUTPATIENT_CLINIC_OR_DEPARTMENT_OTHER): Payer: Self-pay | Admitting: Emergency Medicine

## 2022-02-20 DIAGNOSIS — Z5321 Procedure and treatment not carried out due to patient leaving prior to being seen by health care provider: Secondary | ICD-10-CM | POA: Insufficient documentation

## 2022-02-20 DIAGNOSIS — R1030 Lower abdominal pain, unspecified: Secondary | ICD-10-CM | POA: Diagnosis present

## 2022-02-20 NOTE — ED Triage Notes (Signed)
Pt POV reports red bump to left groin area x2 days. Would like to be checked for STI's.

## 2022-02-21 ENCOUNTER — Emergency Department (HOSPITAL_BASED_OUTPATIENT_CLINIC_OR_DEPARTMENT_OTHER)
Admission: EM | Admit: 2022-02-21 | Discharge: 2022-02-21 | Disposition: A | Discharge status: Home / Self Care | Payer: Medicaid Other | Attending: Emergency Medicine | Admitting: Emergency Medicine

## 2022-02-21 ENCOUNTER — Other Ambulatory Visit: Payer: Self-pay

## 2022-02-21 ENCOUNTER — Encounter (HOSPITAL_BASED_OUTPATIENT_CLINIC_OR_DEPARTMENT_OTHER): Payer: Self-pay | Admitting: Emergency Medicine

## 2022-02-21 ENCOUNTER — Encounter (HOSPITAL_BASED_OUTPATIENT_CLINIC_OR_DEPARTMENT_OTHER): Payer: Self-pay

## 2022-02-21 ENCOUNTER — Emergency Department (HOSPITAL_BASED_OUTPATIENT_CLINIC_OR_DEPARTMENT_OTHER)
Admission: EM | Admit: 2022-02-21 | Discharge: 2022-02-22 | Disposition: A | Discharge status: Home / Self Care | Payer: Medicaid Other | Source: Home / Self Care | Attending: Emergency Medicine | Admitting: Emergency Medicine

## 2022-02-21 ENCOUNTER — Emergency Department (HOSPITAL_BASED_OUTPATIENT_CLINIC_OR_DEPARTMENT_OTHER): Payer: Medicaid Other

## 2022-02-21 DIAGNOSIS — S93602A Unspecified sprain of left foot, initial encounter: Secondary | ICD-10-CM | POA: Insufficient documentation

## 2022-02-21 DIAGNOSIS — W01198A Fall on same level from slipping, tripping and stumbling with subsequent striking against other object, initial encounter: Secondary | ICD-10-CM | POA: Insufficient documentation

## 2022-02-21 DIAGNOSIS — N9089 Other specified noninflammatory disorders of vulva and perineum: Secondary | ICD-10-CM | POA: Insufficient documentation

## 2022-02-21 DIAGNOSIS — S0990XA Unspecified injury of head, initial encounter: Secondary | ICD-10-CM | POA: Insufficient documentation

## 2022-02-21 DIAGNOSIS — Z202 Contact with and (suspected) exposure to infections with a predominantly sexual mode of transmission: Secondary | ICD-10-CM

## 2022-02-21 LAB — URINALYSIS, ROUTINE W REFLEX MICROSCOPIC
Bilirubin Urine: NEGATIVE
Glucose, UA: NEGATIVE mg/dL
Ketones, ur: NEGATIVE mg/dL
Leukocytes,Ua: NEGATIVE
Nitrite: NEGATIVE
Protein, ur: NEGATIVE mg/dL
Specific Gravity, Urine: 1.025 (ref 1.005–1.030)
pH: 6 (ref 5.0–8.0)

## 2022-02-21 LAB — WET PREP, GENITAL
Clue Cells Wet Prep HPF POC: NONE SEEN
Sperm: NONE SEEN
Trich, Wet Prep: NONE SEEN
WBC, Wet Prep HPF POC: <10 (ref <10)
Yeast Wet Prep HPF POC: NONE SEEN

## 2022-02-21 LAB — URINALYSIS, MICROSCOPIC (REFLEX)

## 2022-02-21 LAB — HIV ANTIBODY (ROUTINE TESTING W REFLEX): HIV Screen 4th Generation wRfx: NONREACTIVE

## 2022-02-21 LAB — PREGNANCY, URINE: Preg Test, Ur: NEGATIVE

## 2022-02-21 MED ORDER — CEFTRIAXONE SODIUM 500 MG IJ SOLR
500.0000 mg | Freq: Once | INTRAMUSCULAR | Status: AC
  Administered 2022-02-21: 500 mg via INTRAMUSCULAR
  Filled 2022-02-21: qty 500

## 2022-02-21 MED ORDER — FOSFOMYCIN TROMETHAMINE 3 G PO PACK
3.0000 g | PACK | Freq: Once | ORAL | Status: AC
  Administered 2022-02-21: 3 g via ORAL
  Filled 2022-02-21: qty 3

## 2022-02-21 MED ORDER — DOXYCYCLINE HYCLATE 100 MG PO TABS
100.0000 mg | ORAL_TABLET | Freq: Once | ORAL | Status: AC
  Administered 2022-02-21: 100 mg via ORAL
  Filled 2022-02-21: qty 1

## 2022-02-21 MED ORDER — DOXYCYCLINE HYCLATE 100 MG PO CAPS
100.0000 mg | ORAL_CAPSULE | Freq: Two times a day (BID) | ORAL | 0 refills | Status: AC
Start: 2022-02-21 — End: 2022-02-28

## 2022-02-21 NOTE — ED Triage Notes (Signed)
Pt had intercourse with new partner yesterday morning. Wants to be evaluated for STD. Was here yesterday but left AMA. Denies symptoms.

## 2022-02-21 NOTE — ED Triage Notes (Signed)
Pt walking stepped on uneven sidewalk/grass fell, hit head and injured foot states head is ok.

## 2022-02-22 ENCOUNTER — Encounter (HOSPITAL_BASED_OUTPATIENT_CLINIC_OR_DEPARTMENT_OTHER): Payer: Self-pay | Admitting: Emergency Medicine

## 2022-02-22 LAB — RPR: RPR Ser Ql: NONREACTIVE

## 2022-02-22 LAB — GC/CHLAMYDIA PROBE AMP (~~LOC~~) NOT AT ARMC
Chlamydia: NEGATIVE
Comment: NEGATIVE
Comment: NORMAL
Neisseria Gonorrhea: NEGATIVE

## 2022-02-22 NOTE — ED Provider Notes (Signed)
[facility] EMERGENCY DEPARTMENT Provider Note   CSN: [id number] Arrival date & time: 02/21/22  6962     History  Chief Complaint  Patient presents with   Exposure to STD    Elizabeth Ray is a 21 y.o. female.  HPI     21yo female presents with concern for STI evaluation.  She had intercourse with new partner yesterday, reports this is unusual and she was intoxicated at the time and is worried about potential exposures to STIs and having regrets. She does have implanon in.  She noticed small red bumps on outside of vagina.  She is not having abdominal pain, dysuria, nausea, vomiting or vaginal discharge.    Home Medications Prior to Admission medications   Medication Sig Start Date End Date Taking? Authorizing Provider  doxycycline (VIBRAMYCIN) 100 MG capsule Take 1 capsule (100 mg total) by mouth 2 (two) times daily for 7 days. 02/21/22 02/28/22 Yes Alvira Monday, MD  levETIRAcetam (KEPPRA) 500 MG tablet Take 1 tablet (500 mg total) by mouth 2 (two) times daily. 10/07/21   Clayborne Dana, NP      Allergies    Metronidazole and related    Review of Systems   Review of Systems  Physical Exam Updated Vital Signs BP 133/76   Pulse 96   Temp 97.6 F (36.4 C) (Oral)   Resp 16   Ht 5\' 3"  (1.6 m)   Wt 68 kg   LMP 02/14/2022 (Exact Date)   SpO2 99%   BMI 26.57 kg/m  Physical Exam Vitals and nursing note reviewed.  Constitutional:      General: She is not in acute distress.    Appearance: Normal appearance. She is not ill-appearing, toxic-appearing or diaphoretic.  HENT:     Head: Normocephalic.  Eyes:     Conjunctiva/sclera: Conjunctivae normal.  Cardiovascular:     Rate and Rhythm: Normal rate and regular rhythm.     Pulses: Normal pulses.  Pulmonary:     Effort: Pulmonary effort is normal. No respiratory distress.  Abdominal:     General: Abdomen is flat. There is no distension.     Palpations: There is no mass.     Tenderness: There is no  abdominal tenderness.     Hernia: No hernia is present.  Genitourinary:    Comments: No pelvic tenderness Musculoskeletal:        General: No deformity or signs of injury.     Cervical back: No rigidity.  Skin:    General: Skin is warm and dry.     Coloration: Skin is not jaundiced or pale.  Neurological:     General: No focal deficit present.     Mental Status: She is alert and oriented to person, place, and time.     ED Results / Procedures / Treatments   Labs (all labs ordered are listed, but only abnormal results are displayed) Labs Reviewed  URINALYSIS, ROUTINE W REFLEX MICROSCOPIC - Abnormal; Notable for the following components:      Result Value   APPearance HAZY (*)    Hgb urine dipstick LARGE (*)    All other components within normal limits  URINALYSIS, MICROSCOPIC (REFLEX) - Abnormal; Notable for the following components:   Bacteria, UA MANY (*)    All other components within normal limits  WET PREP, GENITAL  PREGNANCY, URINE  RPR  HIV ANTIBODY (ROUTINE TESTING W REFLEX)  GC/CHLAMYDIA PROBE AMP ([place]) NOT AT [facility]    EKG None  Radiology DG Foot Complete Left  Result Date: 02/21/2022 CLINICAL DATA:  Fall and trauma to the left foot. EXAM: LEFT FOOT - COMPLETE 3+ VIEW COMPARISON:  Left foot radiograph dated 07/16/2017. FINDINGS: There is no evidence of fracture or dislocation. There is no evidence of arthropathy or other focal bone abnormality. Soft tissues are unremarkable. IMPRESSION: Negative. Electronically Signed   By: Elgie Collard M.D.   On: 02/21/2022 23:54    Procedures Procedures    Medications Ordered in ED Medications  cefTRIAXone (ROCEPHIN) injection 500 mg (500 mg Intramuscular Given 02/21/22 1037)  doxycycline (VIBRA-TABS) tablet 100 mg (100 mg Oral Given 02/21/22 1035)  fosfomycin (MONUROL) packet 3 g (3 g Oral Given 02/21/22 1106)    ED Course/ Medical Decision Making/ A&P                           Medical Decision  Making Amount and/or Complexity of Data Reviewed Labs: ordered.  Risk Prescription drug management.   21yo female presents with concern for STI evaluation.  Normal follicles on exam, no sign of genital warts, herpes, cellulitis or abscess.  Pregnancy test negative. Wet prep was completed and no sign of yeast, trichomonas, clue cells.  RPR/HIV ordered and pending.  No pelvic tenderness, doubt TOA, PID.  UA with many bacteria, no WBC.Given fosfomycin in ED for possible UTI. Discussed possible STI ppx and would like treatment for GC/Chl, declines HIV prophylaxis. Given rocephin and rx for doxycycline. Discussed safe sex.  Patient discharged in stable condition with understanding of reasons to return.         Final Clinical Impression(s) / ED Diagnoses Final diagnoses:  Possible exposure to STD    Rx / DC Orders ED Discharge Orders          Ordered    doxycycline (VIBRAMYCIN) 100 MG capsule  2 times daily        02/21/22 1041              Alvira Monday, MD 02/22/22 2346

## 2022-02-22 NOTE — ED Provider Notes (Signed)
   [facility] EMERGENCY DEPARTMENT  Provider Note  CSN: [id number] Arrival date & time: 02/21/22 2327  History Chief Complaint  Patient presents with   Foot Injury   Fall    Elizabeth Ray is a 21 y.o. female reports she stumbled and fell about 8 hours ago, hitting her head on the concrete and injuring her L foot. She reports she was 'hearing bells' initially but that has resolved. Foot pain has been persistent, worse with movement and bearing weight.    Home Medications Prior to Admission medications   Medication Sig Start Date End Date Taking? Authorizing Provider  doxycycline (VIBRAMYCIN) 100 MG capsule Take 1 capsule (100 mg total) by mouth 2 (two) times daily for 7 days. 02/21/22 02/28/22  Alvira Monday, MD  levETIRAcetam (KEPPRA) 500 MG tablet Take 1 tablet (500 mg total) by mouth 2 (two) times daily. 10/07/21   Clayborne Dana, NP     Allergies    Metronidazole and related   Review of Systems   Review of Systems Please see HPI for pertinent positives and negatives  Physical Exam BP 128/79 (BP Location: Right Arm)   Pulse 98   Temp 98.6 F (37 C) (Oral)   Resp 18   Ht 5\' 3"  (1.6 m)   Wt 68 kg   LMP 02/14/2022 (Exact Date)   SpO2 96%   BMI 26.56 kg/m   Physical Exam Vitals and nursing note reviewed.  HENT:     Head: Normocephalic.     Nose: Nose normal.  Eyes:     Extraocular Movements: Extraocular movements intact.  Pulmonary:     Effort: Pulmonary effort is normal.  Musculoskeletal:        General: Tenderness (base of L 5th metatarsal and lateral L foot) present. No deformity. Normal range of motion.     Cervical back: Neck supple.  Skin:    Findings: No rash (on exposed skin).  Neurological:     Mental Status: She is alert and oriented to person, place, and time.  Psychiatric:        Mood and Affect: Mood normal.     ED Results / Procedures / Treatments   EKG None  Procedures Procedures  Medications Ordered in the  ED Medications - No data to display  Initial Impression and Plan  Patient with foot injury. I personally viewed the images from radiology studies and agree with radiologist interpretation: Xray is neg for fracture. Will provide post-op shoe, crutches. Recommend rest, ice and elevation. She had a head injury approx 8 hours ago, currently asymptomatic. No indication for imaging.    ED Course       MDM Rules/Calculators/A&P Medical Decision Making Problems Addressed: Injury of head, initial encounter: acute illness or injury Sprain of left foot, initial encounter: acute illness or injury  Amount and/or Complexity of Data Reviewed Radiology: ordered and independent interpretation performed. Decision-making details documented in ED Course.  Risk OTC drugs.    Final Clinical Impression(s) / ED Diagnoses Final diagnoses:  Sprain of left foot, initial encounter  Injury of head, initial encounter    Rx / DC Orders ED Discharge Orders     None        04/16/2022, MD 02/22/22 [phone]

## 2022-02-22 NOTE — ED Provider Notes (Incomplete)
   [facility] EMERGENCY DEPARTMENT  Provider Note  CSN: [id number] Arrival date & time: 02/21/22 2327  History Chief Complaint  Patient presents with  . Foot Injury  . Fall    ARLENIS HOUGHAM is a 21 y.o. female reports she stumbled and fell about 8 hours ago, hitting her head on the concrete and injuring her L foot. She reports she was 'hearing bells' initially but that has resolved. Foot pain has been persistent, worse with movement and bearing weight.    Home Medications Prior to Admission medications   Medication Sig Start Date End Date Taking? Authorizing Provider  benzonatate (TESSALON) 200 MG capsule Take 1 capsule (200 mg total) by mouth 2 (two) times daily as needed for cough. Patient not taking: Reported on 01/26/2022 12/15/21   Terrilyn Saver, NP  doxycycline (VIBRAMYCIN) 100 MG capsule Take 1 capsule (100 mg total) by mouth 2 (two) times daily for 7 days. 02/21/22 02/28/22  Gareth Morgan, MD  guaiFENesin (MUCINEX) 600 MG 12 hr tablet Take 2 tablets (1,200 mg total) by mouth 2 (two) times daily. Patient not taking: Reported on 01/26/2022 12/15/21   Terrilyn Saver, NP  levETIRAcetam (KEPPRA) 500 MG tablet Take 1 tablet (500 mg total) by mouth 2 (two) times daily. 10/07/21   Terrilyn Saver, NP  metroNIDAZOLE (METROGEL) 0.75 % vaginal gel Place 1 Applicatorful vaginally at bedtime. Apply one applicatorful to vagina at bedtime for 5 days Patient not taking: Reported on 01/26/2022 01/11/22   Radene Gunning, MD  polyethylene glycol (MIRALAX / GLYCOLAX) 17 g packet Take 17 g by mouth daily. Patient not taking: Reported on 01/26/2022 01/14/22   Varney Biles, MD     Allergies    Metronidazole and related   Review of Systems   Review of Systems Please see HPI for pertinent positives and negatives  Physical Exam BP 128/79 (BP Location: Right Arm)   Pulse 98   Temp 98.6 F (37 C) (Oral)   Resp 18   Ht 5\' 3"  (1.6 m)   Wt 68 kg   LMP 02/14/2022 (Exact Date)   SpO2 96%    BMI 26.56 kg/m   Physical Exam Vitals and nursing note reviewed.  HENT:     Head: Normocephalic.     Nose: Nose normal.  Eyes:     Extraocular Movements: Extraocular movements intact.  Pulmonary:     Effort: Pulmonary effort is normal.  Musculoskeletal:        General: Tenderness (base of L 5th metatarsal and lateral L foot) present. No deformity. Normal range of motion.     Cervical back: Neck supple.  Skin:    Findings: No rash (on exposed skin).  Neurological:     Mental Status: She is alert and oriented to person, place, and time.  Psychiatric:        Mood and Affect: Mood normal.     ED Results / Procedures / Treatments   EKG None  Procedures Procedures  Medications Ordered in the ED Medications - No data to display  Initial Impression and Plan  ***  ED Course       MDM Rules/Calculators/A&P Medical Decision Making Amount and/or Complexity of Data Reviewed Radiology: ordered.    Final Clinical Impression(s) / ED Diagnoses Final diagnoses:  None    Rx / DC Orders ED Discharge Orders     None

## 2022-02-23 ENCOUNTER — Ambulatory Visit (INDEPENDENT_AMBULATORY_CARE_PROVIDER_SITE_OTHER): Payer: Medicaid Other | Admitting: Family Medicine

## 2022-02-23 ENCOUNTER — Other Ambulatory Visit (HOSPITAL_COMMUNITY)
Admission: RE | Admit: 2022-02-23 | Discharge: 2022-02-23 | Disposition: A | Discharge status: Home / Self Care | Payer: Medicaid Other | Source: Ambulatory Visit | Attending: Family Medicine | Admitting: Family Medicine

## 2022-02-23 ENCOUNTER — Encounter: Payer: Self-pay | Admitting: Family Medicine

## 2022-02-23 ENCOUNTER — Encounter: Payer: Self-pay | Admitting: General Practice

## 2022-02-23 VITALS — BP 129/66 | HR 96 | Wt 155.0 lb

## 2022-02-23 DIAGNOSIS — Z113 Encounter for screening for infections with a predominantly sexual mode of transmission: Secondary | ICD-10-CM | POA: Diagnosis not present

## 2022-02-23 DIAGNOSIS — Z3046 Encounter for surveillance of implantable subdermal contraceptive: Secondary | ICD-10-CM | POA: Diagnosis not present

## 2022-02-23 NOTE — Progress Notes (Signed)
   Subjective:    Patient ID: Elizabeth Ray, female    DOB: Jul 18, 2001, 21 y.o.   MRN: [id number]  HPI  Patient returns vaginal bleeding after Nexplanon insertion about a month ago.  Has been consistent with moderate bleeding.   She has been to the emergency department couple times and has been tested for STIs.  This is all returned negative.  Last testing 6/13, which were all normal. She does feel that she is small bumps on the labia minora which she is concerned about.  Review of Systems     Objective:   Physical Exam Vitals reviewed. Exam conducted with a chaperone present.  Constitutional:      Appearance: Normal appearance.  Abdominal:     Hernia: There is no hernia in the left inguinal area or right inguinal area.  Genitourinary:    Labia:        Right: No rash or tenderness.        Left: No rash or tenderness.      Vagina: No signs of injury. No vaginal discharge or tenderness.     Cervix: No cervical motion tenderness, friability or erythema.     Comments: External genitalia normal. The area of concern are oil glands on the clitoral hood and labia minora.  Lymphadenopathy:     Lower Body: No right inguinal adenopathy. No left inguinal adenopathy.  Neurological:     Mental Status: She is alert.  Psychiatric:        Mood and Affect: Mood normal.        Behavior: Behavior normal.        Thought Content: Thought content normal.       Assessment & Plan:  1. Routine screening for STI (sexually transmitted infection) Although patient had normal testing 2 days ago, patient would like to be tested again. Discussed normal external genitalia without STIs.  - Cervicovaginal ancillary only( [place])  2. Nexplanon removal Discussed options for treating BTB with nexplanon - patient desires removal.

## 2022-02-23 NOTE — Progress Notes (Signed)
Nexplanon Removal:  Patient given informed consent for removal of her Implanon, time out was performed.  Signed copy in the chart.  Appropriate time out taken. Implanon site identified.  Area prepped in usual sterile fashon. One cc of 1% lidocaine was used to anesthetize the area at the distal end of the implant. A small stab incision was made right beside the implant on the distal portion.  The implanon rod was grasped using hemostats and removed without difficulty.  There was less than 3 cc blood loss. There were no complications.  A small amount of antibiotic ointment and steri-strips were applied over the small incision.  A pressure bandage was applied to reduce any bruising.  The patient tolerated the procedure well and was given post procedure instructions. 

## 2022-02-26 ENCOUNTER — Other Ambulatory Visit: Payer: Self-pay

## 2022-02-26 ENCOUNTER — Encounter (HOSPITAL_BASED_OUTPATIENT_CLINIC_OR_DEPARTMENT_OTHER): Payer: Self-pay | Admitting: Emergency Medicine

## 2022-02-26 ENCOUNTER — Emergency Department (HOSPITAL_BASED_OUTPATIENT_CLINIC_OR_DEPARTMENT_OTHER)
Admission: EM | Admit: 2022-02-26 | Discharge: 2022-02-26 | Disposition: A | Discharge status: Home / Self Care | Payer: Medicaid Other | Attending: Emergency Medicine | Admitting: Emergency Medicine

## 2022-02-26 DIAGNOSIS — M545 Low back pain, unspecified: Secondary | ICD-10-CM | POA: Insufficient documentation

## 2022-02-26 DIAGNOSIS — Z20822 Contact with and (suspected) exposure to covid-19: Secondary | ICD-10-CM | POA: Insufficient documentation

## 2022-02-26 DIAGNOSIS — Z202 Contact with and (suspected) exposure to infections with a predominantly sexual mode of transmission: Secondary | ICD-10-CM | POA: Insufficient documentation

## 2022-02-26 LAB — URINALYSIS, ROUTINE W REFLEX MICROSCOPIC
Bilirubin Urine: NEGATIVE
Glucose, UA: NEGATIVE mg/dL
Ketones, ur: NEGATIVE mg/dL
Leukocytes,Ua: NEGATIVE
Nitrite: NEGATIVE
Protein, ur: NEGATIVE mg/dL
Specific Gravity, Urine: 1.015 (ref 1.005–1.030)
pH: 7 (ref 5.0–8.0)

## 2022-02-26 LAB — URINALYSIS, MICROSCOPIC (REFLEX): WBC, UA: NONE SEEN WBC/hpf (ref 0–5)

## 2022-02-26 LAB — WET PREP, GENITAL
Clue Cells Wet Prep HPF POC: NONE SEEN
Sperm: NONE SEEN
Trich, Wet Prep: NONE SEEN
WBC, Wet Prep HPF POC: <10 (ref <10)

## 2022-02-26 LAB — CERVICOVAGINAL ANCILLARY ONLY
Bacterial Vaginitis (gardnerella): NEGATIVE
Candida Glabrata: NEGATIVE
Candida Vaginitis: NEGATIVE
Chlamydia: NEGATIVE
Comment: NEGATIVE
Comment: NEGATIVE
Comment: NEGATIVE
Comment: NEGATIVE
Comment: NEGATIVE
Comment: NORMAL
Neisseria Gonorrhea: NEGATIVE
Trichomonas: NEGATIVE

## 2022-02-26 LAB — PREGNANCY, URINE: Preg Test, Ur: NEGATIVE

## 2022-02-26 LAB — SARS CORONAVIRUS 2 BY RT PCR: SARS Coronavirus 2 by RT PCR: NEGATIVE

## 2022-02-26 LAB — HIV ANTIBODY (ROUTINE TESTING W REFLEX): HIV Screen 4th Generation wRfx: NONREACTIVE

## 2022-02-26 MED ORDER — FLUCONAZOLE 200 MG PO TABS: 200.0000 mg | ORAL_TABLET | Freq: Once | ORAL | 0 refills | Status: AC

## 2022-02-26 NOTE — ED Notes (Signed)
Patient states she has an emergency that she needs to attend to. Patient requesting testing be done, but cannot wait for results. Patient seems anxious in room.

## 2022-02-26 NOTE — ED Provider Notes (Signed)
[facility] EMERGENCY DEPARTMENT Provider Note   CSN: [id number] Arrival date & time: 02/26/22  1555     History  Chief Complaint  Patient presents with   Exposure to STD   Back Pain    Elizabeth Ray is a 21 y.o. female.  The patient presents with a chief complaint of right flank pain which began last night and concerns of a possible STI exposure.  The patient denies other abdominal pain, nausea, vomiting, diarrhea, constipation.  Patient denies dysuria.  The patient endorses a new sexual partner and wants STI testing including GC, HIV, RPR.  She denies any vaginal discharge.  Denies any fevers.  Past medical history significant for anxiety, depression, ADHD, bipolar and related disorder  HPI     Home Medications Prior to Admission medications   Medication Sig Start Date End Date Taking? Authorizing Provider  doxycycline (VIBRAMYCIN) 100 MG capsule Take 1 capsule (100 mg total) by mouth 2 (two) times daily for 7 days. 02/21/22 02/28/22  Alvira Monday, MD  levETIRAcetam (KEPPRA) 500 MG tablet Take 1 tablet (500 mg total) by mouth 2 (two) times daily. 10/07/21   Clayborne Dana, NP      Allergies    Metronidazole and related    Review of Systems   Review of Systems  Constitutional:  Negative for fever.  Respiratory:  Negative for shortness of breath.   Cardiovascular:  Negative for chest pain.  Gastrointestinal:  Negative for abdominal pain and nausea.  Genitourinary:  Positive for flank pain. Negative for dysuria, vaginal bleeding and vaginal discharge.    Physical Exam Updated Vital Signs BP 134/79 (BP Location: Right Arm)   Pulse 90   Temp 98.6 F (37 C) (Oral)   Resp 18   Ht 5\' 3"  (1.6 m)   Wt 69.4 kg   LMP 02/14/2022 (Exact Date)   SpO2 100%   BMI 27.10 kg/m  Physical Exam Vitals and nursing note reviewed. Exam conducted with a chaperone present.  Constitutional:      General: She is not in acute distress. HENT:     Head: Normocephalic and  atraumatic.     Mouth/Throat:     Mouth: Mucous membranes are moist.  Eyes:     Conjunctiva/sclera: Conjunctivae normal.  Cardiovascular:     Rate and Rhythm: Normal rate and regular rhythm.     Pulses: Normal pulses.  Pulmonary:     Effort: Pulmonary effort is normal.  Abdominal:     Palpations: Abdomen is soft.     Tenderness: There is no abdominal tenderness. There is no right CVA tenderness or left CVA tenderness.     Comments: No tenderness to palpation including right flank/CVA area  Genitourinary:    General: Normal vulva.     Exam position: Lithotomy position.     Pubic Area: No rash.      Labia:        Right: No rash, tenderness or lesion.        Left: No rash, tenderness or lesion.      Vagina: Normal.     Cervix: No cervical motion tenderness, discharge, friability, lesion or cervical bleeding.     Uterus: Normal.      Adnexa: Right adnexa normal and left adnexa normal.  Musculoskeletal:        General: Normal range of motion.     Cervical back: Normal range of motion.  Skin:    General: Skin is warm and dry.  Capillary Refill: Capillary refill takes less than 2 seconds.  Neurological:     Mental Status: She is alert.     ED Results / Procedures / Treatments   Labs (all labs ordered are listed, but only abnormal results are displayed) Labs Reviewed  URINALYSIS, ROUTINE W REFLEX MICROSCOPIC - Abnormal; Notable for the following components:      Result Value   Hgb urine dipstick TRACE (*)    All other components within normal limits  URINALYSIS, MICROSCOPIC (REFLEX) - Abnormal; Notable for the following components:   Bacteria, UA FEW (*)    All other components within normal limits  SARS CORONAVIRUS 2 BY RT PCR  WET PREP, GENITAL  PREGNANCY, URINE  RAPID HIV SCREEN (HIV 1/2 AB+AG)  RPR  GC/CHLAMYDIA PROBE AMP ([place]) NOT AT [facility]    EKG None  Radiology No results found.  Procedures Procedures    Medications Ordered in ED Medications -  No data to display  ED Course/ Medical Decision Making/ A&P                           Medical Decision Making Amount and/or Complexity of Data Reviewed Labs: ordered.   T the patient's chief complaint is STI exposure.  The patient also complains of mild right flank pain.  Differential for the flank pain includes nephrolithiasis, cystitis, pyelonephritis, and others   Co morbidities that complicate the patient evaluation  History of anxiety, bipolar related disorder, depression   Additional history obtained:   External records from outside source obtained and reviewed including STI evaluation on June 13   Lab Tests:  I Ordered, and personally interpreted labs.  The pertinent results include: Urinalysis with trace hemoglobin, few bacteria; RPR HIV GC chlamydia wet prep and coronavirus testing pending   Test / Admission - Considered:  No sign of cystitis/pyelonephritis on exam and urinalysis.  Very minimal back pain on exam.  This is possibly musculoskeletal in nature.  She may use ibuprofen/Tylenol as needed.  STI testing is pending at this time.  Normal genitourinary exam.   Coronavirus testing pending, based on my exam and history I feel that this is unlikely as she is mostly asymptomatic at this time.  Patient states she needs to leave at this time which is reasonable.  She will be contacted if testing is returned as positive.  Testing was negative for GC chlamydia 4 days ago, feel unlikely that it is positive today.  Final Clinical Impression(s) / ED Diagnoses Final diagnoses:  Exposure to STD  Acute right-sided low back pain without sciatica    Rx / DC Orders ED Discharge Orders     None         Pamala Duffel 02/26/22 1649    Virgina Norfolk, DO 02/26/22 1714

## 2022-02-26 NOTE — Discharge Instructions (Addendum)
You were seen today for back pain and possible STI exposure.  You will be contacted if your testing comes back positive and prescriptions will be ordered if needed.  For your back pain this is likely musculoskeletal in nature.  I recommend using Tylenol or ibuprofen as needed.  Follow-up with primary care.

## 2022-02-26 NOTE — ED Triage Notes (Signed)
Pt arrives pov, steady gait, c/o HA, dizziness,  Back pain and loss of taste since yesterday. Pt also endorses concern that palate is white. Pt also endorses requests for STI.

## 2022-02-27 LAB — GC/CHLAMYDIA PROBE AMP (~~LOC~~) NOT AT ARMC
Chlamydia: NEGATIVE
Comment: NEGATIVE
Comment: NORMAL
Neisseria Gonorrhea: NEGATIVE

## 2022-02-27 LAB — RPR: RPR Ser Ql: NONREACTIVE

## 2022-03-01 ENCOUNTER — Encounter (HOSPITAL_BASED_OUTPATIENT_CLINIC_OR_DEPARTMENT_OTHER): Payer: Self-pay | Admitting: Emergency Medicine

## 2022-03-01 ENCOUNTER — Emergency Department (HOSPITAL_BASED_OUTPATIENT_CLINIC_OR_DEPARTMENT_OTHER)
Admission: EM | Admit: 2022-03-01 | Discharge: 2022-03-01 | Disposition: A | Discharge status: Home / Self Care | Payer: Medicaid Other | Attending: Emergency Medicine | Admitting: Emergency Medicine

## 2022-03-01 ENCOUNTER — Other Ambulatory Visit: Payer: Self-pay

## 2022-03-01 DIAGNOSIS — N760 Acute vaginitis: Secondary | ICD-10-CM | POA: Insufficient documentation

## 2022-03-01 DIAGNOSIS — B9689 Other specified bacterial agents as the cause of diseases classified elsewhere: Secondary | ICD-10-CM | POA: Diagnosis not present

## 2022-03-01 DIAGNOSIS — J029 Acute pharyngitis, unspecified: Secondary | ICD-10-CM | POA: Insufficient documentation

## 2022-03-01 DIAGNOSIS — N898 Other specified noninflammatory disorders of vagina: Secondary | ICD-10-CM | POA: Diagnosis present

## 2022-03-01 LAB — URINALYSIS, ROUTINE W REFLEX MICROSCOPIC
Bilirubin Urine: NEGATIVE
Glucose, UA: NEGATIVE mg/dL
Hgb urine dipstick: NEGATIVE
Ketones, ur: NEGATIVE mg/dL
Leukocytes,Ua: NEGATIVE
Nitrite: NEGATIVE
Protein, ur: NEGATIVE mg/dL
Specific Gravity, Urine: 1.02 (ref 1.005–1.030)
pH: 8 (ref 5.0–8.0)

## 2022-03-01 LAB — WET PREP, GENITAL
Sperm: NONE SEEN
Trich, Wet Prep: NONE SEEN
WBC, Wet Prep HPF POC: <10 (ref <10)
Yeast Wet Prep HPF POC: NONE SEEN

## 2022-03-01 LAB — PREGNANCY, URINE: Preg Test, Ur: NEGATIVE

## 2022-03-01 LAB — GROUP A STREP BY PCR: Group A Strep by PCR: NOT DETECTED

## 2022-03-01 MED ORDER — METRONIDAZOLE 0.75 % VA GEL: 1.0000 | Freq: Two times a day (BID) | VAGINAL | 0 refills | Status: DC

## 2022-03-01 NOTE — ED Notes (Signed)
Discharge instructions reviewed and recommendations reviewed with pt. States understanding. Ambulatory upon discharge

## 2022-03-01 NOTE — ED Provider Notes (Signed)
[facility] EMERGENCY DEPARTMENT Provider Note   CSN: [id number] Arrival date & time: 03/01/22  1546     History  Chief Complaint  Patient presents with   Sore Throat   Vaginal Discharge    Elizabeth Ray is a 21 y.o. female with a past medical history significant for anxiety, bipolar who presents with concern for ongoing vaginal discharge, as well as sore throat/tongue tingling the last several days.  Patient reports that she had sexual contact with a new partner and was worried about sexually transmitted infection.  Screened for STI 3 days ago, Results showed no evidence of STI, trichomoniasis, BV, but did show yeast infection.  Patient has taken 1 dose of fluconazole and reports that she had if anything slightly increased discharge after the fact.  She denies significant irritation, bleeding, dysuria, dyspareunia.  She denies any new recent sexual contacts.  Patient reports that no one looked in her mouth at her last visit and she self swab so she just wanted a physical exam to make sure that she does not have any signs of sexually transmitted infection.   Sore Throat  Vaginal Discharge      Home Medications Prior to Admission medications   Medication Sig Start Date End Date Taking? Authorizing Provider  metroNIDAZOLE (METROGEL VAGINAL) 0.75 % vaginal gel Place 1 Applicatorful vaginally 2 (two) times daily. Only if you are experiencing vaginal itching and irritation 03/01/22  Yes Sheray Grist H, PA-C  levETIRAcetam (KEPPRA) 500 MG tablet Take 1 tablet (500 mg total) by mouth 2 (two) times daily. 10/07/21   Clayborne Dana, NP      Allergies    Metronidazole and related    Review of Systems   Review of Systems  Genitourinary:  Positive for vaginal discharge.  All other systems reviewed and are negative.   Physical Exam Updated Vital Signs BP 126/83 (BP Location: Right Arm)   Pulse (!) 104   Temp 98.8 F (37.1 C) (Oral)   Resp 18   Ht 5\' 3"  (1.6 m)    Wt 69.4 kg   LMP 02/14/2022 (Exact Date)   SpO2 100%   BMI 27.10 kg/m  Physical Exam Vitals and nursing note reviewed.  Constitutional:      General: She is not in acute distress.    Appearance: Normal appearance.  HENT:     Head: Normocephalic and atraumatic.     Comments: Normal appearance of external mouth    Mouth/Throat:     Tonsils: No tonsillar abscesses.     Comments: I see no evidence of tonsillar swelling, posterior pharynx erythema, tongue abnormality. Uvular midline, no oral ulcers. Eyes:     General:        Right eye: No discharge.        Left eye: No discharge.  Cardiovascular:     Rate and Rhythm: Normal rate and regular rhythm.  Pulmonary:     Effort: Pulmonary effort is normal. No respiratory distress.  Genitourinary:    Comments: Normal external genitalia.  Nulliparous cervix.  Normal physiologic discharge.  No cervical motion tenderness, no adnexal tenderness Musculoskeletal:        General: No deformity.  Skin:    General: Skin is warm and dry.  Neurological:     Mental Status: She is alert and oriented to person, place, and time.  Psychiatric:        Mood and Affect: Mood normal.        Behavior: Behavior normal.  ED Results / Procedures / Treatments   Labs (all labs ordered are listed, but only abnormal results are displayed) Labs Reviewed  WET PREP, GENITAL - Abnormal; Notable for the following components:      Result Value   Clue Cells Wet Prep HPF POC PRESENT (*)    All other components within normal limits  URINALYSIS, ROUTINE W REFLEX MICROSCOPIC - Abnormal; Notable for the following components:   APPearance CLOUDY (*)    All other components within normal limits  GROUP A STREP BY PCR  PREGNANCY, URINE    EKG None  Radiology No results found.  Procedures Procedures    Medications Ordered in ED Medications - No data to display  ED Course/ Medical Decision Making/ A&P                           Medical Decision  Making Amount and/or Complexity of Data Reviewed Labs: ordered.   This patient is a 21 y.o. female who presents to the ED for concern of vaginal discharge, sore throat, tongue tingling, chest pain with cough, this involves an extensive number of treatment options, and is a complaint that carries with it a high risk of complications and morbidity. The emergent differential diagnosis prior to evaluation includes, but is not limited to, guidance, pneumonia, STI, ongoing yeast infection, UTI, tubo-ovarian abscess, gonococcal pharyngitis, strep throat, COVID, flu versus other.   This is not an exhaustive differential.   Past Medical History / Co-morbidities / Social History: Anxiety, depression, bipolar, seizure disorder  Additional history: Chart reviewed. Pertinent results include: Patient was seen and evaluated the emergency department 3 days prior, reviewed lab work, imaging from this visit.  Patient with no evidence of STI, positive for yeast infection.  Physical Exam: Physical exam performed. The pertinent findings include: Normal appearance of oropharynx, no evidence of pharyngitis, tonsillitis, epiglottitis, Ludwig angina.  No evidence of oral lesions, herpes, versus other.  Normal appearance of external genitalia, no cervical motion tenderness, no adnexal tenderness.  Normal physiologic discharge noted.  Lab Tests: I ordered, and personally interpreted labs.  The pertinent results include: UA without any evidence of urinary tract infection.  PCR strep negative.  Pregnancy test negative.  Wet prep shows clue cells, however clinically patient without signs and symptoms of symptomatic bacterial vaginosis.  Discussed I will give her a prophylactic prescription for MetroGel, but encouraged her not to use unless she has significant vaginal itching or irritation.  Do not see any evidence of ongoing yeast infection at this time.  Discussed that she may have some physiologic changes to her normal  discharge after recent Nexplanon removal, but I do not see any worrisome signs or symptoms today.   Disposition: After consideration of the diagnostic results and the patients response to treatment, I feel that patient with no signs or symptoms of acute or emergent conditions today.  Think that she had some worry about possible STI and was not satisfied with previous physical exam to rule out emergent STI.  Again no evidence of clinical STI or other abnormality today.   emergency department workup does not suggest an emergent condition requiring admission or immediate intervention beyond what has been performed at this time. The patient is safe for discharge and has been instructed to return immediately for worsening symptoms, change in symptoms or any other concerns.  I discussed this case with my attending physician Dr. Rush Landmark who cosigned this note including patient's presenting symptoms, physical  exam, and planned diagnostics and interventions. Attending physician stated agreement with plan or made changes to plan which were implemented.    Final Clinical Impression(s) / ED Diagnoses Final diagnoses:  Sore throat  Vaginal discharge  Bacterial vaginosis    Rx / DC Orders ED Discharge Orders          Ordered    metroNIDAZOLE (METROGEL VAGINAL) 0.75 % vaginal gel  2 times daily        03/01/22 1708              Taylee Gunnells, [place] H, PA-C 03/01/22 1710    Tegeler, Canary Brim, MD 03/01/22 1728

## 2022-03-01 NOTE — ED Triage Notes (Signed)
Pt reports sore throat x 2d; vaginal dc since yesterday

## 2022-03-01 NOTE — Discharge Instructions (Addendum)
As we discussed there was some evidence of clue cells which are a sign of bacterial vaginosis on your wet prep, however clinically you do not have signs and symptoms of symptomatic bacterial vaginosis.  I will prescribe you some MetroGel as we discussed but I would hold off on filling this prescription unless you are experiencing significant vaginal itching, irritation.  Again I see no signs of acute sexually-transmitted infection, or other acute or emergent infections of the throat.  Please follow-up with your primary care doctor and OB/GYN.

## 2022-03-08 ENCOUNTER — Ambulatory Visit (INDEPENDENT_AMBULATORY_CARE_PROVIDER_SITE_OTHER): Payer: Medicaid Other | Admitting: Family Medicine

## 2022-03-08 ENCOUNTER — Other Ambulatory Visit (HOSPITAL_COMMUNITY)
Admission: RE | Admit: 2022-03-08 | Discharge: 2022-03-08 | Disposition: A | Discharge status: Home / Self Care | Payer: Medicaid Other | Source: Ambulatory Visit | Attending: Family Medicine | Admitting: Family Medicine

## 2022-03-08 ENCOUNTER — Encounter: Payer: Self-pay | Admitting: Family Medicine

## 2022-03-08 VITALS — BP 115/82 | HR 98 | Temp 97.7°F | Ht 63.0 in | Wt 155.0 lb

## 2022-03-08 DIAGNOSIS — R109 Unspecified abdominal pain: Secondary | ICD-10-CM | POA: Insufficient documentation

## 2022-03-08 DIAGNOSIS — R5383 Other fatigue: Secondary | ICD-10-CM

## 2022-03-08 DIAGNOSIS — N898 Other specified noninflammatory disorders of vagina: Secondary | ICD-10-CM | POA: Insufficient documentation

## 2022-03-08 LAB — POC URINALSYSI DIPSTICK (AUTOMATED)
Bilirubin, UA: NEGATIVE
Blood, UA: NEGATIVE
Glucose, UA: NEGATIVE
Ketones, UA: NEGATIVE
Leukocytes, UA: NEGATIVE
Nitrite, UA: NEGATIVE
Protein, UA: NEGATIVE
Spec Grav, UA: 1.01 (ref 1.010–1.025)
Urobilinogen, UA: 0.2 E.U./dL
pH, UA: 7 (ref 5.0–8.0)

## 2022-03-08 LAB — POCT URINE PREGNANCY: Preg Test, Ur: NEGATIVE

## 2022-03-08 MED ORDER — FLUCONAZOLE 150 MG PO TABS: 150.0000 mg | ORAL_TABLET | Freq: Once | ORAL | 0 refills | Status: AC

## 2022-03-08 NOTE — Progress Notes (Signed)
Acute Office Visit  Subjective:     Patient ID: Elizabeth Ray, female    DOB: 2000-10-09, 21 y.o.   MRN: [id number]  CC: vaginal discharge, fatigue    HPI Patient is in today for vaginal discharge and fatigue.   Patient reports she has been struggling with vaginal discharge for the past several weeks. States she went to the ED recently and was treated with one diflucan and metrogel (still taking), but doesn't feel confident that either is working. She is also concerned because she has had some mild pelvic discomfort/cramping and ended up having a 2-week long period recently after Nexplanon was removed. She has not had any other symptoms. She has not discussed with GYN yet. Reports recent STD testing was negative so she defers retest today. Denies any severe pain, fevers, chills, body aches, back/flank pain, urinary changes other than dark urine. She would like to be retested for yeast/BV today.   Additionally, she has noticed some fatigue for the past few weeks, feeling like she needs to nap during the day. She denies any recent weight changes, change to hair/skin/nails, nausea, vomiting, diarrhea, constipation, blood in urine/stool, dysuria.     ROS All review of systems negative except what is listed in the HPI      Objective:    BP 115/82   Pulse 98   Temp 97.7 F (36.5 C)   Ht 5\' 3"  (1.6 m)   Wt 155 lb (70.3 kg)   LMP 02/14/2022 (Exact Date)   SpO2 100%   BMI 27.46 kg/m    Physical Exam Vitals reviewed.  Constitutional:      General: She is not in acute distress.    Appearance: Normal appearance. She is normal weight. She is not ill-appearing.  Genitourinary:    General: Normal vulva.     Vagina: No foreign body. Vaginal discharge present. No erythema, tenderness or bleeding.     Cervix: No cervical motion tenderness, friability or erythema.     Uterus: Normal.      Adnexa:        Right: No mass, tenderness or fullness.         Left: No mass, tenderness or  fullness.    Neurological:     Mental Status: She is alert.        Results for orders placed or performed in visit on 03/08/22  POCT Urinalysis Dipstick (Automated)  Result Value Ref Range   Color, UA yellow    Clarity, UA turbid    Glucose, UA Negative Negative   Bilirubin, UA neg    Ketones, UA neg    Spec Grav, UA 1.010 1.010 - 1.025   Blood, UA neg    pH, UA 7.0 5.0 - 8.0   Protein, UA Negative Negative   Urobilinogen, UA 0.2 0.2 or 1.0 E.U./dL   Nitrite, UA neg    Leukocytes, UA Negative Negative  POCT urine pregnancy  Result Value Ref Range   Preg Test, Ur Negative Negative        Assessment & Plan:    Flank pain Abdominal pain, unspecified abdominal location Vaginal discharge UA negative. Repeat vaginal swab for yeast/BV at patient request. Discharge suspicious for yeast so sending in Diflucan. Educated that some of her cramping, irregular periods, etc could be related to hormones d/t recently having Nexplanon removed. No red flags on exam. Patient aware of signs/symptoms requiring further/urgent evaluation.   - POCT Urinalysis Dipstick (Automated) - Urine Culture - POCT urine pregnancy -  Cervicovaginal ancillary only - fluconazole (DIFLUCAN) 150 MG tablet; Take 1 tablet (150 mg total) by mouth once for 1 dose. If not improved in 72 hours, repeat dose.  Dispense: 2 tablet; Refill: 0  Fatigue, unspecified type Generalized fatigue. Updating labs today. Recommend good sleep habits, healthy diet, and regular exercise.  - CBC - Comprehensive metabolic panel - TSH - Vitamin B12    Return if symptoms worsen or fail to improve.  Clayborne Dana, NP

## 2022-03-08 NOTE — Progress Notes (Signed)
Light headed for 1 week Abd pain for 1 week Vaginal discharge

## 2022-03-09 LAB — CBC
HCT: 40.7 % (ref 36.0–46.0)
Hemoglobin: 13.2 g/dL (ref 12.0–15.0)
MCHC: 32.4 g/dL (ref 30.0–36.0)
MCV: 90 fl (ref 78.0–100.0)
Platelets: 273 10*3/uL (ref 150.0–400.0)
RBC: 4.52 Mil/uL (ref 3.87–5.11)
RDW: 12.9 % (ref 11.5–15.5)
WBC: 8.4 10*3/uL (ref 4.0–10.5)

## 2022-03-09 LAB — COMPREHENSIVE METABOLIC PANEL
ALT: 15 U/L (ref 0–35)
AST: 15 U/L (ref 0–37)
Albumin: 4.5 g/dL (ref 3.5–5.2)
Alkaline Phosphatase: 69 U/L (ref 39–117)
BUN: 8 mg/dL (ref 6–23)
CO2: 27 mEq/L (ref 19–32)
Calcium: 9.7 mg/dL (ref 8.4–10.5)
Chloride: 102 mEq/L (ref 96–112)
Creatinine, Ser: 0.79 mg/dL (ref 0.40–1.20)
GFR: 106.24 mL/min (ref >60.00)
Glucose, Bld: 80 mg/dL (ref 70–99)
Potassium: 4.2 mEq/L (ref 3.5–5.1)
Sodium: 136 mEq/L (ref 135–145)
Total Bilirubin: 0.4 mg/dL (ref 0.2–1.2)
Total Protein: 7 g/dL (ref 6.0–8.3)

## 2022-03-09 LAB — URINE CULTURE
MICRO NUMBER:: 13583542
Result:: NO GROWTH
SPECIMEN QUALITY:: ADEQUATE

## 2022-03-09 LAB — VITAMIN B12: Vitamin B-12: 272 pg/mL (ref 211–911)

## 2022-03-09 LAB — TSH: TSH: 1.11 u[IU]/mL (ref 0.35–5.50)

## 2022-03-10 LAB — CERVICOVAGINAL ANCILLARY ONLY
Bacterial Vaginitis (gardnerella): NEGATIVE
Candida Glabrata: POSITIVE — AB
Candida Vaginitis: NEGATIVE
Comment: NEGATIVE
Comment: NEGATIVE
Comment: NEGATIVE

## 2022-03-13 ENCOUNTER — Encounter: Payer: Self-pay | Admitting: Family Medicine

## 2022-03-23 ENCOUNTER — Other Ambulatory Visit (HOSPITAL_COMMUNITY)
Admission: RE | Admit: 2022-03-23 | Discharge: 2022-03-23 | Disposition: A | Discharge status: Home / Self Care | Payer: Medicaid Other | Source: Ambulatory Visit | Attending: Family Medicine | Admitting: Family Medicine

## 2022-03-23 ENCOUNTER — Ambulatory Visit (INDEPENDENT_AMBULATORY_CARE_PROVIDER_SITE_OTHER): Payer: Medicaid Other

## 2022-03-23 ENCOUNTER — Telehealth: Payer: Self-pay | Admitting: General Practice

## 2022-03-23 VITALS — BP 119/74 | HR 84 | Wt 155.0 lb

## 2022-03-23 DIAGNOSIS — N898 Other specified noninflammatory disorders of vagina: Secondary | ICD-10-CM

## 2022-03-23 DIAGNOSIS — Z3202 Encounter for pregnancy test, result negative: Secondary | ICD-10-CM

## 2022-03-23 DIAGNOSIS — Z113 Encounter for screening for infections with a predominantly sexual mode of transmission: Secondary | ICD-10-CM

## 2022-03-23 LAB — POCT URINE PREGNANCY: Preg Test, Ur: NEGATIVE

## 2022-03-23 NOTE — Progress Notes (Signed)
Pt presents for STI testing and UPT. UPT; negative. Self swab was sent to the lab.  Dalya Maselli l Naheem Mosco, CMA

## 2022-03-23 NOTE — Telephone Encounter (Signed)
Patient aware that she will need to make payments towards her account and that copay is due time of service.  Pt verbalized understanding.

## 2022-03-24 LAB — CERVICOVAGINAL ANCILLARY ONLY
Bacterial Vaginitis (gardnerella): NEGATIVE
Candida Glabrata: NEGATIVE
Candida Vaginitis: NEGATIVE
Chlamydia: NEGATIVE
Comment: NEGATIVE
Comment: NEGATIVE
Comment: NEGATIVE
Comment: NEGATIVE
Comment: NEGATIVE
Comment: NORMAL
Neisseria Gonorrhea: NEGATIVE
Trichomonas: NEGATIVE

## 2022-04-05 ENCOUNTER — Encounter: Payer: Self-pay | Admitting: Family Medicine

## 2022-04-05 ENCOUNTER — Other Ambulatory Visit (HOSPITAL_BASED_OUTPATIENT_CLINIC_OR_DEPARTMENT_OTHER): Payer: Self-pay

## 2022-04-05 ENCOUNTER — Other Ambulatory Visit: Payer: Self-pay

## 2022-04-05 MED ORDER — FLUCONAZOLE 150 MG PO TABS
150.0000 mg | ORAL_TABLET | Freq: Once | ORAL | 1 refills | Status: AC
  Filled 2022-04-05: qty 1, 1d supply, fill #0

## 2022-04-05 MED ORDER — FLUCONAZOLE 150 MG PO TABS: 150.0000 mg | ORAL_TABLET | Freq: Once | ORAL | 1 refills | Status: DC

## 2022-04-05 NOTE — Progress Notes (Signed)
Patient states that [facility] told her diflucan was on back order. Sent Rx to  [facility]. Victorino Dike [facility]

## 2022-04-06 NOTE — Progress Notes (Signed)
Pt presents for STI testing and UPT. UPT; negative. Self swab was sent to the lab.  Macyn Remmert l Athalia Setterlund, CMA  

## 2022-04-14 ENCOUNTER — Other Ambulatory Visit (HOSPITAL_BASED_OUTPATIENT_CLINIC_OR_DEPARTMENT_OTHER): Payer: Self-pay

## 2022-04-24 ENCOUNTER — Encounter (HOSPITAL_BASED_OUTPATIENT_CLINIC_OR_DEPARTMENT_OTHER): Payer: Self-pay | Admitting: Emergency Medicine

## 2022-04-24 ENCOUNTER — Ambulatory Visit: Payer: Medicaid Other | Admitting: Family Medicine

## 2022-04-24 ENCOUNTER — Other Ambulatory Visit: Payer: Self-pay

## 2022-04-24 ENCOUNTER — Emergency Department (HOSPITAL_BASED_OUTPATIENT_CLINIC_OR_DEPARTMENT_OTHER)
Admission: EM | Admit: 2022-04-24 | Discharge: 2022-04-24 | Discharge status: Against Medical Advice | Payer: Medicaid Other | Attending: Emergency Medicine | Admitting: Emergency Medicine

## 2022-04-24 DIAGNOSIS — R5383 Other fatigue: Secondary | ICD-10-CM | POA: Insufficient documentation

## 2022-04-24 DIAGNOSIS — Z5321 Procedure and treatment not carried out due to patient leaving prior to being seen by health care provider: Secondary | ICD-10-CM | POA: Diagnosis not present

## 2022-04-24 LAB — URINALYSIS, ROUTINE W REFLEX MICROSCOPIC
Bilirubin Urine: NEGATIVE
Glucose, UA: NEGATIVE mg/dL
Hgb urine dipstick: NEGATIVE
Ketones, ur: NEGATIVE mg/dL
Leukocytes,Ua: NEGATIVE
Nitrite: NEGATIVE
Protein, ur: NEGATIVE mg/dL
Specific Gravity, Urine: 1.015 (ref 1.005–1.030)
pH: 8.5 — ABNORMAL HIGH (ref 5.0–8.0)

## 2022-04-24 LAB — PREGNANCY, URINE: Preg Test, Ur: NEGATIVE

## 2022-04-24 NOTE — ED Triage Notes (Signed)
Patient reports she has a mole to the right side of her face, states it has become discolored and has hardened within the last 2 weeks. Patient also reports she has been feeling very tired.   Lastly, patient states she has a yeast infection that has been treated but has not resolved.

## 2022-04-24 NOTE — ED Notes (Signed)
Called Once for Examination Room with No Response.

## 2022-04-25 ENCOUNTER — Other Ambulatory Visit: Payer: Self-pay

## 2022-04-25 ENCOUNTER — Emergency Department (HOSPITAL_BASED_OUTPATIENT_CLINIC_OR_DEPARTMENT_OTHER)
Admission: EM | Admit: 2022-04-25 | Discharge: 2022-04-25 | Disposition: A | Discharge status: Home / Self Care | Payer: Medicaid Other | Attending: Emergency Medicine | Admitting: Emergency Medicine

## 2022-04-25 ENCOUNTER — Encounter (HOSPITAL_BASED_OUTPATIENT_CLINIC_OR_DEPARTMENT_OTHER): Payer: Self-pay | Admitting: Urology

## 2022-04-25 DIAGNOSIS — R5383 Other fatigue: Secondary | ICD-10-CM

## 2022-04-25 DIAGNOSIS — N898 Other specified noninflammatory disorders of vagina: Secondary | ICD-10-CM | POA: Insufficient documentation

## 2022-04-25 DIAGNOSIS — L292 Pruritus vulvae: Secondary | ICD-10-CM | POA: Diagnosis not present

## 2022-04-25 DIAGNOSIS — N949 Unspecified condition associated with female genital organs and menstrual cycle: Secondary | ICD-10-CM

## 2022-04-25 DIAGNOSIS — R102 Pelvic and perineal pain: Secondary | ICD-10-CM | POA: Diagnosis not present

## 2022-04-25 LAB — CBC WITH DIFFERENTIAL/PLATELET
Abs Immature Granulocytes: 0.01 10*3/uL (ref 0.00–0.07)
Basophils Absolute: 0.1 10*3/uL (ref 0.0–0.1)
Basophils Relative: 1 %
Eosinophils Absolute: 0.4 10*3/uL (ref 0.0–0.5)
Eosinophils Relative: 5 %
HCT: 40.3 % (ref 36.0–46.0)
Hemoglobin: 13.4 g/dL (ref 12.0–15.0)
Immature Granulocytes: 0 %
Lymphocytes Relative: 31 %
Lymphs Abs: 2.2 10*3/uL (ref 0.7–4.0)
MCH: 29.9 pg (ref 26.0–34.0)
MCHC: 33.3 g/dL (ref 30.0–36.0)
MCV: 90 fL (ref 80.0–100.0)
Monocytes Absolute: 0.4 10*3/uL (ref 0.1–1.0)
Monocytes Relative: 5 %
Neutro Abs: 4.1 10*3/uL (ref 1.7–7.7)
Neutrophils Relative %: 58 %
Platelets: 268 10*3/uL (ref 150–400)
RBC: 4.48 MIL/uL (ref 3.87–5.11)
RDW: 11.9 % (ref 11.5–15.5)
WBC: 7 10*3/uL (ref 4.0–10.5)
nRBC: 0 % (ref 0.0–0.2)

## 2022-04-25 LAB — BASIC METABOLIC PANEL
Anion gap: 5 (ref 5–15)
BUN: 7 mg/dL (ref 6–20)
CO2: 26 mmol/L (ref 22–32)
Calcium: 9 mg/dL (ref 8.9–10.3)
Chloride: 106 mmol/L (ref 98–111)
Creatinine, Ser: 0.74 mg/dL (ref 0.44–1.00)
GFR, Estimated: >60 mL/min (ref >60)
Glucose, Bld: 87 mg/dL (ref 70–99)
Potassium: 4.1 mmol/L (ref 3.5–5.1)
Sodium: 137 mmol/L (ref 135–145)

## 2022-04-25 LAB — URINALYSIS, ROUTINE W REFLEX MICROSCOPIC
Bilirubin Urine: NEGATIVE
Glucose, UA: NEGATIVE mg/dL
Hgb urine dipstick: NEGATIVE
Ketones, ur: NEGATIVE mg/dL
Leukocytes,Ua: NEGATIVE
Nitrite: NEGATIVE
Protein, ur: NEGATIVE mg/dL
Specific Gravity, Urine: 1.02 (ref 1.005–1.030)
pH: 5.5 (ref 5.0–8.0)

## 2022-04-25 LAB — WET PREP, GENITAL
Clue Cells Wet Prep HPF POC: NONE SEEN
Sperm: NONE SEEN
Trich, Wet Prep: NONE SEEN
WBC, Wet Prep HPF POC: <10 (ref <10)
Yeast Wet Prep HPF POC: NONE SEEN

## 2022-04-25 LAB — PREGNANCY, URINE: Preg Test, Ur: NEGATIVE

## 2022-04-25 NOTE — Discharge Instructions (Signed)
You have been seen today for your complaint of vaginal discomfort, discharge, lesion on your mole. Your lab work was reassuring and showed no abnormalities. Follow up with: Dermatologist of your choice for evaluation of the lesion on, he should also see your gynecologist for evaluation of ongoing vaginal irritation issues. Please seek immediate medical care if you develop any of the following symptoms: You have a fever and your symptoms get worse all of a sudden. At this time there does not appear to be the presence of an emergent medical condition, however there is always the potential for conditions to change. Please read and follow the below instructions.  Do not take your medicine if  develop an itchy rash, swelling in your mouth or lips, or difficulty breathing; call 911 and seek immediate emergency medical attention if this occurs.  You may review your lab tests and imaging results in their entirety on your MyChart account.  Please discuss all results of fully with your primary care provider and other specialist at your follow-up visit.  Note: Portions of this text may have been transcribed using voice recognition software. Every effort was made to ensure accuracy; however, inadvertent computerized transcription errors may still be present.

## 2022-04-25 NOTE — ED Triage Notes (Signed)
Right side of mouth mole x 2 weeks, getting larger Painful to touch  Feels like she may have a yeast infections, took home UTI test and it was positive  States " I want blood work too"

## 2022-04-25 NOTE — ED Provider Notes (Signed)
[facility] EMERGENCY DEPARTMENT Provider Note   CSN: [id number] Arrival date & time: 04/25/22  1324     History  Chief Complaint  Patient presents with   Multple Complaints    Elizabeth Ray is a 21 y.o. female.  Past medical history of anxiety, depression, bipolar, ADHD, seizures who presents to the ED for evaluation of a mass under the mole near the right angle of her mouth and white clumpy vaginal discharge.  She states the mass under her mole has been present for approximately 2 weeks, has been red and progressively gotten bigger, now states that the mass is hard.  Has tried warm compresses, the lesion has not formed a head or expressed any fluid.  Patient states that she has had white clumpy discharge for for 2 days.  Patient is sexually active with 1 partner, does not use protection.  Would like to be tested for yeast infection as well as an STI panel and UPT.  Patient is agreeable to RPR and HIV test as well.  Patient would like her "blood tested because I am tired all the time and want to know my levels." Patient denies chest pain, shortness of breath, dysuria, flank pain, fevers.  HPI     Home Medications Prior to Admission medications   Medication Sig Start Date End Date Taking? Authorizing Provider  levETIRAcetam (KEPPRA) 500 MG tablet Take 1 tablet (500 mg total) by mouth 2 (two) times daily. 10/07/21   Clayborne Dana, NP  metroNIDAZOLE (METROGEL VAGINAL) 0.75 % vaginal gel Place 1 Applicatorful vaginally 2 (two) times daily. Only if you are experiencing vaginal itching and irritation Patient not taking: Reported on 03/23/2022 03/01/22   Prosperi, Christian H, PA-C      Allergies    Metronidazole and related    Review of Systems   Review of Systems  Constitutional:  Positive for fatigue. Negative for chills and fever.  Genitourinary:  Positive for vaginal discharge and vaginal pain. Negative for difficulty urinating, dyspareunia, dysuria, flank pain,  frequency, genital sores, hematuria, pelvic pain, urgency and vaginal bleeding.  All other systems reviewed and are negative.   Physical Exam Updated Vital Signs BP 125/71   Pulse 69   Temp 98.1 F (36.7 C) (Oral)   Resp 16   Ht 5\' 3"  (1.6 m)   Wt 70.3 kg   LMP 04/03/2022 (Approximate)   SpO2 100%   BMI 27.45 kg/m  Physical Exam Vitals and nursing note reviewed. Exam conducted with a chaperone present 04/05/2022).  Constitutional:      General: She is not in acute distress.    Appearance: She is well-developed.  HENT:     Head: Normocephalic and atraumatic.  Eyes:     Conjunctiva/sclera: Conjunctivae normal.  Cardiovascular:     Rate and Rhythm: Normal rate and regular rhythm.  Pulmonary:     Effort: Pulmonary effort is normal. No respiratory distress.  Abdominal:     Palpations: Abdomen is soft.     Tenderness: There is no abdominal tenderness.  Genitourinary:    General: Normal vulva.     Exam position: Supine.     Pubic Area: No rash or pubic lice.      Labia:        Right: No rash, tenderness, lesion or injury.        Left: No rash, tenderness, lesion or injury.      Vagina: Vaginal discharge (white, clumpy) and erythema present. No bleeding.  Cervix: Erythema present. No friability.     Comments: Minimal cervical tenderness Musculoskeletal:        General: No swelling.     Cervical back: Neck supple.  Skin:    General: Skin is warm and dry.     Capillary Refill: Capillary refill takes less than 2 seconds.     Findings: Lesion present.     Comments: Small papule on the superior border of the nevus slightly inferior and lateral to the right side of the angle of her mouth  Neurological:     Mental Status: She is alert.  Psychiatric:        Mood and Affect: Mood normal.     ED Results / Procedures / Treatments   Labs (all labs ordered are listed, but only abnormal results are displayed) Labs Reviewed  WET PREP, GENITAL  PREGNANCY, URINE   URINALYSIS, ROUTINE W REFLEX MICROSCOPIC  BASIC METABOLIC PANEL  CBC WITH DIFFERENTIAL/PLATELET  RPR  HIV ANTIBODY (ROUTINE TESTING W REFLEX)  GC/CHLAMYDIA PROBE AMP () NOT AT [facility]    EKG None  Radiology No results found.  Procedures Procedures    Medications Ordered in ED Medications - No data to display  ED Course/ Medical Decision Making/ A&P Clinical Course as of 04/25/22 1713  Tue Apr 25, 2022  1504 Urinalysis, Routine w reflex microscopic Urine, Clean Catch Negative for UTI [AS]  1504 Pregnancy, urine Negative pregnancy [AS]  1636 CBC with Differential Within normal limits, no anemia to describe her fatigue [AS]  1636 Basic metabolic panel Within normal limits, no electrolyte abnormalities [AS]  1712 Patient left the ED prior to receiving discharge paperwork.  Lab results and follow-up were discussed with patient prior to her leaving. [AS]    Clinical Course User Index [AS] Lula Olszewski Edsel Petrin, PA-C                           Medical Decision Making This patient presents to the ED for concern of mass underneath the mole on the right side of her face, white clumpy vaginal discharge, this involves an extensive number of treatment options, and is a complaint that carries with it a high risk of complications and morbidity.  The differential diagnosis includes melanoma, actinic keratosis, basal cell carcinoma, squamous cell carcinoma, vaginitis caused by Candida, trichomonas, chlamydia, gonorrhea,   Co morbidities that complicate the patient evaluation       Prior candidal vaginitis, unprotected sex  My initial workup includes CBC, BMP per patient request, and to rule out anemia or electrolyte abnormality as a cause of her fatigue.  STI panel, urinalysis, urine pregnancy test  Additional history obtained from: Nursing notes from this visit. Prior ED visit yesterday, 04/24/2022.  Patient left without being seen. Previous records within EMR system clinic  notes on 01/11/2022 01/26/2022 02/23/2022 03/23/2022, all of these were for vaginal irritation and possible infection.  She has been treated multiple times  I ordered, reviewed and interpreted labs which include: CBC, BMP, UPT, wet prep, GC, RPR, HIV.  All labs within normal limits.  GC, RPR, HIV have yet to be resulted.   Cardiac Monitoring:       The patient was not maintained on a cardiac monitor.  Consultations Obtained:  No consultations  After extensive chart review, it was noted that the patient has been seen a large number of times over the past 3 months for the same complaint of vaginal irritation.  She  has been treated with Diflucan and metronidazole gel.  Patient reported white clumpy discharge during initial evaluation.  This was seen on pelvic exam.  Afterwards, patient stated that she used Monistat this morning.  Wet prep negative.  Patient is afebrile and stable for discharge.  I discussed with patient that she should follow-up with her gynecologist for further evaluation of vaginitis symptoms.  Papule to the superior border of the nevus to the right angle of her mouth has low suspicion for brain a dermatologic malignancy.  More likely, this is a pustule that has not come to ahead yet.  According to patient, the area of the nevus, border, and color have all remained stable since the papule presented.  I have discussed that she should follow-up with a dermatologist for further evaluation.  Lab work negative for anemia or electrolyte abnormality.  I discussed with the patient that she should follow-up with her primary care provider for further evaluation of her fatigue.  Patient left the ED after results and follow-up were discussed, but prior to receiving her discharge paperwork.  At this time there does not appear to be any evidence of an acute emergency medical condition and the patient appears stable for discharge with appropriate outpatient follow up. Diagnosis was discussed with patient  who verbalizes understanding of care plan and is agreeable to discharge. I have discussed return precautions with patient who verbalizes understanding. Patient encouraged to follow-up with their PCP within 1 week. All questions answered.   Note: Portions of this report may have been transcribed using voice recognition software. Every effort was made to ensure accuracy; however, inadvertent computerized transcription errors may still be present.   Amount and/or Complexity of Data Reviewed Labs: ordered. Decision-making details documented in ED Course.          Final Clinical Impression(s) / ED Diagnoses Final diagnoses:  None    Rx / DC Orders ED Discharge Orders     None         Michelle Piper, Cordelia Poche 04/25/22 1814    Terrilee Files, MD 04/26/22 1122

## 2022-04-25 NOTE — ED Notes (Signed)
Pt to RN desk asking for d/c paperwork. Pt informed that d/c paperwork is yet to be completed by EDP and RN will bring to room when available. Pt reports that her ride is here and she does not want to wait. Pt ambulatory to ED exit without assistance. EDP Alex made aware.

## 2022-04-26 LAB — GC/CHLAMYDIA PROBE AMP (~~LOC~~) NOT AT ARMC
Chlamydia: NEGATIVE
Comment: NEGATIVE
Comment: NORMAL
Neisseria Gonorrhea: NEGATIVE

## 2022-04-26 LAB — HIV ANTIBODY (ROUTINE TESTING W REFLEX): HIV Screen 4th Generation wRfx: REACTIVE — AB

## 2022-04-26 LAB — RPR: RPR Ser Ql: NONREACTIVE

## 2022-04-27 ENCOUNTER — Encounter: Payer: Self-pay | Admitting: Family Medicine

## 2022-04-27 ENCOUNTER — Ambulatory Visit (INDEPENDENT_AMBULATORY_CARE_PROVIDER_SITE_OTHER): Payer: Medicaid Other | Admitting: Family Medicine

## 2022-04-27 VITALS — BP 123/85 | HR 153 | Wt 160.0 lb

## 2022-04-27 DIAGNOSIS — Z21 Asymptomatic human immunodeficiency virus [HIV] infection status: Secondary | ICD-10-CM | POA: Diagnosis not present
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
## 2022-04-28 ENCOUNTER — Ambulatory Visit: Payer: Medicaid Other | Admitting: Family Medicine

## 2022-04-28 LAB — HIV-1/2 AB - DIFFERENTIATION
HIV 1 Ab: NONREACTIVE
HIV 2 Ab: NONREACTIVE
Note: NEGATIVE

## 2022-04-28 LAB — HIV-1/HIV-2 QUALITATIVE RNA
Final Interpretation: NEGATIVE
HIV-1 RNA, Qualitative: NONREACTIVE
HIV-2 RNA, Qualitative: NONREACTIVE

## 2022-05-01 ENCOUNTER — Telehealth: Payer: Self-pay

## 2022-05-01 NOTE — Telephone Encounter (Signed)
I talked with patient about results. HIV RNA negative, suggestive of false positive. I also discussed this with ID - labs are consistent with false positive. Recommended retest in 3-4 weeks. Will arrange for patient to come in for testing early September.   I did discuss the possibility of her starting PREP, especially if her current partner may have additional partners. Patient will think about this.

## 2022-05-01 NOTE — Telephone Encounter (Signed)
Patient called to confirm results of  HIV Qualitative and differentiation. Read both are non reactive but patient would like reassurance from provider about results. Will route to provider to give her a call. Elizabeth Ray [facility]

## 2022-05-10 ENCOUNTER — Ambulatory Visit: Payer: Medicaid Other | Admitting: Family Medicine

## 2022-05-11 ENCOUNTER — Ambulatory Visit (HOSPITAL_COMMUNITY): Payer: Medicaid Other | Admitting: Clinical

## 2022-05-12 ENCOUNTER — Ambulatory Visit: Payer: Medicaid Other | Admitting: Family

## 2022-05-18 ENCOUNTER — Ambulatory Visit: Payer: Medicaid Other | Admitting: Family Medicine

## 2022-05-26 ENCOUNTER — Other Ambulatory Visit (HOSPITAL_COMMUNITY)
Admission: RE | Admit: 2022-05-26 | Discharge: 2022-05-26 | Disposition: A | Discharge status: Home / Self Care | Payer: Medicaid Other | Source: Ambulatory Visit | Attending: Family Medicine | Admitting: Family Medicine

## 2022-05-26 ENCOUNTER — Encounter: Payer: Self-pay | Admitting: Family Medicine

## 2022-05-26 ENCOUNTER — Ambulatory Visit (INDEPENDENT_AMBULATORY_CARE_PROVIDER_SITE_OTHER): Payer: Medicaid Other | Admitting: Family Medicine

## 2022-05-26 VITALS — BP 103/78 | HR 74 | Wt 160.0 lb

## 2022-05-26 DIAGNOSIS — Z21 Asymptomatic human immunodeficiency virus [HIV] infection status: Secondary | ICD-10-CM

## 2022-05-26 DIAGNOSIS — Z113 Encounter for screening for infections with a predominantly sexual mode of transmission: Secondary | ICD-10-CM

## 2022-05-26 DIAGNOSIS — Z9149 Other personal history of psychological trauma, not elsewhere classified: Secondary | ICD-10-CM | POA: Diagnosis not present
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
## 2022-05-29 ENCOUNTER — Encounter: Payer: Self-pay | Admitting: Family Medicine

## 2022-05-29 DIAGNOSIS — S6991XA Unspecified injury of right wrist, hand and finger(s), initial encounter: Secondary | ICD-10-CM | POA: Diagnosis not present

## 2022-05-30 LAB — CERVICOVAGINAL ANCILLARY ONLY
Chlamydia: NEGATIVE
Comment: NEGATIVE
Comment: NEGATIVE
Comment: NORMAL
Neisseria Gonorrhea: NEGATIVE
Trichomonas: NEGATIVE

## 2022-05-31 ENCOUNTER — Encounter: Payer: Self-pay | Admitting: Family Medicine

## 2022-06-20 DIAGNOSIS — B349 Viral infection, unspecified: Secondary | ICD-10-CM | POA: Diagnosis not present

## 2022-06-20 DIAGNOSIS — R509 Fever, unspecified: Secondary | ICD-10-CM | POA: Diagnosis not present

## 2022-06-27 ENCOUNTER — Telehealth: Payer: Self-pay | Admitting: General Practice

## 2022-06-27 NOTE — Telephone Encounter (Signed)
Called patient to schedule appt with Dr. Nehemiah Settle per inbasket message.  Informed pt of balance and copay that need to be submitted.  Pt verbalized understanding.

## 2022-06-30 DIAGNOSIS — R1031 Right lower quadrant pain: Secondary | ICD-10-CM | POA: Diagnosis not present

## 2022-06-30 DIAGNOSIS — R1032 Left lower quadrant pain: Secondary | ICD-10-CM | POA: Diagnosis not present

## 2022-06-30 DIAGNOSIS — R102 Pelvic and perineal pain: Secondary | ICD-10-CM | POA: Diagnosis not present

## 2022-06-30 DIAGNOSIS — N946 Dysmenorrhea, unspecified: Secondary | ICD-10-CM | POA: Diagnosis not present

## 2022-06-30 DIAGNOSIS — Z3202 Encounter for pregnancy test, result negative: Secondary | ICD-10-CM | POA: Diagnosis not present

## 2022-06-30 DIAGNOSIS — F17210 Nicotine dependence, cigarettes, uncomplicated: Secondary | ICD-10-CM | POA: Diagnosis not present

## 2022-07-10 DIAGNOSIS — S71151A Open bite, right thigh, initial encounter: Secondary | ICD-10-CM | POA: Diagnosis not present

## 2022-07-10 DIAGNOSIS — Z23 Encounter for immunization: Secondary | ICD-10-CM | POA: Diagnosis not present

## 2022-07-10 DIAGNOSIS — M79651 Pain in right thigh: Secondary | ICD-10-CM | POA: Diagnosis not present

## 2022-07-10 DIAGNOSIS — F17210 Nicotine dependence, cigarettes, uncomplicated: Secondary | ICD-10-CM | POA: Diagnosis not present

## 2022-07-10 DIAGNOSIS — S7011XA Contusion of right thigh, initial encounter: Secondary | ICD-10-CM | POA: Diagnosis not present

## 2022-07-13 ENCOUNTER — Ambulatory Visit: Payer: Medicaid Other | Admitting: Family Medicine

## 2022-07-27 ENCOUNTER — Ambulatory Visit: Payer: Medicaid Other | Admitting: Family Medicine

## 2022-07-28 ENCOUNTER — Ambulatory Visit: Payer: Medicaid Other

## 2022-07-28 ENCOUNTER — Other Ambulatory Visit (HOSPITAL_COMMUNITY)
Admission: RE | Admit: 2022-07-28 | Discharge: 2022-07-28 | Disposition: A | Discharge status: Home / Self Care | Payer: Medicaid Other | Source: Ambulatory Visit | Attending: Obstetrics and Gynecology | Admitting: Obstetrics and Gynecology

## 2022-07-28 VITALS — BP 97/75 | HR 69

## 2022-07-28 DIAGNOSIS — Z113 Encounter for screening for infections with a predominantly sexual mode of transmission: Secondary | ICD-10-CM | POA: Diagnosis not present

## 2022-07-28 NOTE — Progress Notes (Unsigned)
Patient states she ended up moving to [place] with boyfriend who has since left her.  Patient requests blood work in addition to vaginal swab. Armandina Stammer, RN

## 2022-07-29 LAB — HIV ANTIBODY (ROUTINE TESTING W REFLEX): HIV Screen 4th Generation wRfx: NONREACTIVE

## 2022-07-29 LAB — RPR: RPR Ser Ql: NONREACTIVE

## 2022-07-29 LAB — HEPATITIS C ANTIBODY: Hep C Virus Ab: NONREACTIVE

## 2022-07-29 LAB — HEPATITIS B SURFACE ANTIGEN: Hepatitis B Surface Ag: NEGATIVE

## 2022-07-31 LAB — GC/CHLAMYDIA PROBE AMP (~~LOC~~) NOT AT ARMC
Chlamydia: NEGATIVE
Comment: NEGATIVE
Comment: NORMAL
Neisseria Gonorrhea: NEGATIVE

## 2022-08-17 ENCOUNTER — Encounter: Payer: Self-pay | Admitting: Family Medicine

## 2022-08-17 ENCOUNTER — Ambulatory Visit: Payer: Medicaid Other | Admitting: Family Medicine

## 2022-08-17 VITALS — BP 116/70 | HR 78 | Wt 164.0 lb

## 2022-08-17 DIAGNOSIS — Z113 Encounter for screening for infections with a predominantly sexual mode of transmission: Secondary | ICD-10-CM
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
## 2022-09-12 ENCOUNTER — Encounter: Payer: Self-pay | Admitting: Family Medicine

## 2022-09-13 ENCOUNTER — Other Ambulatory Visit: Payer: Self-pay

## 2022-09-13 MED ORDER — FLUCONAZOLE 150 MG PO TABS: 150.0000 mg | ORAL_TABLET | Freq: Once | ORAL | 0 refills | Status: AC

## 2022-09-18 ENCOUNTER — Other Ambulatory Visit: Payer: Self-pay

## 2022-09-18 MED ORDER — METRONIDAZOLE 500 MG PO TABS: 500.0000 mg | ORAL_TABLET | Freq: Two times a day (BID) | ORAL | 0 refills | Status: DC

## 2022-09-27 ENCOUNTER — Ambulatory Visit (INDEPENDENT_AMBULATORY_CARE_PROVIDER_SITE_OTHER): Payer: Medicaid Other | Admitting: Family Medicine

## 2022-09-27 ENCOUNTER — Encounter: Payer: Self-pay | Admitting: Family Medicine

## 2022-09-27 ENCOUNTER — Other Ambulatory Visit (HOSPITAL_COMMUNITY)
Admission: RE | Admit: 2022-09-27 | Discharge: 2022-09-27 | Disposition: A | Discharge status: Home / Self Care | Payer: Medicaid Other | Source: Ambulatory Visit | Attending: Family Medicine | Admitting: Family Medicine

## 2022-09-27 ENCOUNTER — Telehealth: Payer: Self-pay

## 2022-09-27 VITALS — BP 113/64 | HR 90 | Temp 98.5°F | Resp 16 | Ht 63.0 in | Wt 165.8 lb

## 2022-09-27 DIAGNOSIS — L709 Acne, unspecified: Secondary | ICD-10-CM | POA: Diagnosis not present

## 2022-09-27 DIAGNOSIS — Z113 Encounter for screening for infections with a predominantly sexual mode of transmission: Secondary | ICD-10-CM

## 2022-09-27 DIAGNOSIS — F909 Attention-deficit hyperactivity disorder, unspecified type: Secondary | ICD-10-CM | POA: Diagnosis not present

## 2022-09-27 DIAGNOSIS — G40309 Generalized idiopathic epilepsy and epileptic syndromes, not intractable, without status epilepticus: Secondary | ICD-10-CM

## 2022-09-27 DIAGNOSIS — Z79899 Other long term (current) drug therapy: Secondary | ICD-10-CM

## 2022-09-27 DIAGNOSIS — R5383 Other fatigue: Secondary | ICD-10-CM

## 2022-09-27 MED ORDER — LISDEXAMFETAMINE DIMESYLATE 30 MG PO CAPS: 30.0000 mg | ORAL_CAPSULE | Freq: Every day | ORAL | 0 refills | Status: DC

## 2022-09-27 MED ORDER — CLINDAMYCIN PHOS-BENZOYL PEROX 1-5 % EX GEL: Freq: Two times a day (BID) | CUTANEOUS | 0 refills | Status: DC

## 2022-09-27 NOTE — Telephone Encounter (Signed)
Elizabeth Ray does not cover clindamycin-benzoyl peroxide gel. When looking at preferred drug list for 2024 clindamycin capsules and solution is covered (generic for Cleocin), or linezolid suspension (oral)/tablet (generic for Zyvox) are covered.

## 2022-09-27 NOTE — Assessment & Plan Note (Signed)
Restarting Vyvanse UDS and contract today F/u 3 months

## 2022-09-27 NOTE — Assessment & Plan Note (Signed)
Pt requesting new referral to neuro. States she was unable to get an appointment last time.  Still taking Keppra and doing well overall

## 2022-09-27 NOTE — Progress Notes (Signed)
Acute Office Visit  Subjective:     Patient ID: Elizabeth Ray, female    DOB: 03-Aug-2001, 22 y.o.   MRN: [id number]  Chief Complaint  Patient presents with   Annual Exam    Bumps on side and stomach    HPI Patient is in today for rash.  Patient states that about a month ago, she started noticing bumps coming up on her face and trunk. States that are small and look like pimples, but leaving scars as they heal. She denies any itching, pain, surrounding redness, rashes, or inflammation. She denies any new soaps, detergents, foods.  Additionally, she reports that she always feels tired. She has trouble falling asleep sometimes and usually only averaged 5 hours of a sleep a night. She would like some blood work done today.   She reports that she is about to start back school at [employer] and would like to restart her Vyvanse. She stopped last year because she felt like she was doing fine without it, but now she is having some trouble concentrating and really wants to get back on track by the time classes start in a few weeks. She was not having any side effects while taking the medication previously.  She would also like routine STD screening, vaginal swab and HSV; no current symptoms.    ROS All review of systems negative except what is listed in the HPI      Objective:    BP 113/64   Pulse 90   Temp 98.5 F (36.9 C)   Resp 16   Ht 5\' 3"  (1.6 m)   Wt 165 lb 12.8 oz (75.2 kg)   SpO2 98%   BMI 29.37 kg/m    Physical Exam Vitals reviewed.  Constitutional:      Appearance: Normal appearance.  HENT:     Head: Normocephalic and atraumatic.  Cardiovascular:     Rate and Rhythm: Normal rate and regular rhythm.  Pulmonary:     Effort: Pulmonary effort is normal.     Breath sounds: Normal breath sounds.  Genitourinary:    Comments: Deferred, self swab Musculoskeletal:     Cervical back: Normal range of motion and neck supple.  Skin:    Comments: Scattered comedones at  different stages of healing to face and trunk, some scarring as well, no surrounding erythema, edema, drainage  Neurological:     General: No focal deficit present.     Mental Status: She is alert and oriented to person, place, and time. Mental status is at baseline.  Psychiatric:        Mood and Affect: Mood normal.        Behavior: Behavior normal.        Thought Content: Thought content normal.        Judgment: Judgment normal.       No results found for any visits on 09/27/22.      Assessment & Plan:   Problem List Items Addressed This Visit       Nervous and Auditory   Generalized idiopathic epilepsy and epileptic syndromes, not intractable, without status epilepticus ([place]) - Primary    Pt requesting new referral to neuro. States she was unable to get an appointment last time.  Still taking Keppra and doing well overall      Relevant Orders   Ambulatory referral to Neurology     Other   ADHD (attention deficit hyperactivity disorder)    Restarting Vyvanse UDS and contract today F/u  3 months       Relevant Medications   lisdexamfetamine (VYVANSE) 30 MG capsule   lisdexamfetamine (VYVANSE) 30 MG capsule (Start on 10/27/2022)   lisdexamfetamine (VYVANSE) 30 MG capsule (Start on 11/26/2022)   High risk medication use    Indication for controlled substance: ADHD Medication and dose: Vyvanse 30 mg daily # pills per month: 30 Last UDS date: ordered today Controlled Substance Treatment Agreement signed (Y/N): yes Controlled Substance Treatment Agreement last reviewed with patient:  today NCCSRS/PDMP reviewed this encounter: Yes        Relevant Orders   DRUG MONITORING, PANEL 8 WITH CONFIRMATION, URINE   Other Visit Diagnoses     Acne, unspecified acne type     Will see if insurance will pay for Benzaclin; if not, OTC options listed on AVS to try, and referral placed to dermatology   Relevant Medications   clindamycin-benzoyl peroxide (BENZACLIN) gel   Other  Relevant Orders   Ambulatory referral to Dermatology   Fatigue, unspecified type     Updating labs today Discussed sleep hygiene habits   Relevant Orders   Comprehensive metabolic panel   CBC with Differential/Platelet   TSH   Screen for STD (sexually transmitted disease)    No symptoms today. Limited screening requested    Relevant Orders   Cervicovaginal ancillary only   Herpes Simplex Virus 1 and 2 (IgG), with Reflex to HSV-2 Inhibition       Meds ordered this encounter  Medications   lisdexamfetamine (VYVANSE) 30 MG capsule    Sig: Take 1 capsule (30 mg total) by mouth daily.    Dispense:  30 capsule    Refill:  0    Order Specific Question:   Supervising Provider    Answer:   Penni Homans A [[id number]]   clindamycin-benzoyl peroxide (BENZACLIN) gel    Sig: Apply topically 2 (two) times daily.    Dispense:  25 g    Refill:  0    Order Specific Question:   Supervising Provider    Answer:   Penni Homans A [[id number]]   lisdexamfetamine (VYVANSE) 30 MG capsule    Sig: Take 1 capsule (30 mg total) by mouth daily.    Dispense:  30 capsule    Refill:  0    Order Specific Question:   Supervising Provider    Answer:   Penni Homans A [[id number]]   lisdexamfetamine (VYVANSE) 30 MG capsule    Sig: Take 1 capsule (30 mg total) by mouth daily.    Dispense:  30 capsule    Refill:  0    Order Specific Question:   Supervising Provider    Answer:   Penni Homans A [[id number]]    Return in about 3 months (around 12/27/2022) for ADD f/u.  Terrilyn Saver, NP

## 2022-09-27 NOTE — Assessment & Plan Note (Signed)
Indication for controlled substance: ADHD Medication and dose: Vyvanse 30 mg daily # pills per month: 30 Last UDS date: ordered today Controlled Substance Treatment Agreement signed (Y/N): yes Controlled Substance Treatment Agreement last reviewed with patient:  today NCCSRS/PDMP reviewed this encounter: Yes

## 2022-09-27 NOTE — Patient Instructions (Signed)
ACNE Recommendations  Products: Face Wash:  Use a gentle cleanser with, such as Cetaphil (generic version of this is fine) Moisturizer:  Use an "oil-free" moisturizer with SPF Prescription Cream(s):  Benzaclin in the morning and  at bedtime Salicylic acid is a key ingredient in washes/creams for acne management  Morning: Wash face, then completely dry Apply Benzaclin, pea size amount that you massage into problem areas on the face. Apply Moisturizer to entire face  Bedtime: Wash face, then completely dry Apply Benzaclin, pea size amount that you massage into problem areas on the face.  Remember: Your acne will probably get worse before it gets better It takes at least 2 months for the medicines to start working Use oil free soaps and lotions; these can be over the counter or store-brand Don't use harsh scrubs or astringents, these can make skin irritation and acne worse Moisturize daily with oil free lotion because the acne medicines will dry your skin  Call your doctor if you have: Lots of skin dryness or redness that doesn't get better if you use a moisturizer or if you use the prescription cream or lotion every other day    Stop using the acne medicine immediately and see your doctor if you are or become pregnant or if you think you had an allergic reaction (itchy rash, difficulty breathing, nausea, vomiting) to your acne medication.  It will take 4 - 6 weeks before you see improvement, so don't get discouraged! Not every medicine works the same for everyone.  We will continue to pick and choose what works best for YOU. Avoid topical oils on your face such as cocoa butter, grease, other oils. Don't squeeze pimples. Exercise regularly and try to reduce your stress level. Use a mild soap on your face, such as Dove or [place].  Adapalene:   Use once daily in the evening after you have washed your face with soap and water.   It may make your face break out more in the 1st month.   It will then improve.  Benzoyl peroxide: Use once daily (morning), after you have washed your face with soap and water. Rinse well because it can bleach your wash cloths and clothing.

## 2022-09-28 ENCOUNTER — Encounter: Payer: Self-pay | Admitting: Family Medicine

## 2022-09-28 LAB — COMPREHENSIVE METABOLIC PANEL
AG Ratio: 1.8 (calc) (ref 1.0–2.5)
ALT: 13 U/L (ref 6–29)
AST: 18 U/L (ref 10–30)
Albumin: 4.4 g/dL (ref 3.6–5.1)
Alkaline phosphatase (APISO): 65 U/L (ref 31–125)
BUN/Creatinine Ratio: 7 (calc) (ref 6–22)
BUN: 6 mg/dL — ABNORMAL LOW (ref 7–25)
CO2: 23 mmol/L (ref 20–32)
Calcium: 9.6 mg/dL (ref 8.6–10.2)
Chloride: 103 mmol/L (ref 98–110)
Creat: 0.82 mg/dL (ref 0.50–0.96)
Globulin: 2.5 g/dL (calc) (ref 1.9–3.7)
Glucose, Bld: 82 mg/dL (ref 65–99)
Potassium: 4.1 mmol/L (ref 3.5–5.3)
Sodium: 137 mmol/L (ref 135–146)
Total Bilirubin: 0.4 mg/dL (ref 0.2–1.2)
Total Protein: 6.9 g/dL (ref 6.1–8.1)

## 2022-09-28 LAB — CBC WITH DIFFERENTIAL/PLATELET
Absolute Monocytes: 400 cells/uL (ref 200–950)
Basophils Absolute: 81 cells/uL (ref 0–200)
Basophils Relative: 1.1 %
Eosinophils Absolute: 407 cells/uL (ref 15–500)
Eosinophils Relative: 5.5 %
HCT: 39.8 % (ref 35.0–45.0)
Hemoglobin: 13.4 g/dL (ref 11.7–15.5)
Lymphs Abs: 2213 cells/uL (ref 850–3900)
MCH: 29 pg (ref 27.0–33.0)
MCHC: 33.7 g/dL (ref 32.0–36.0)
MCV: 86.1 fL (ref 80.0–100.0)
MPV: 11.6 fL (ref 7.5–12.5)
Monocytes Relative: 5.4 %
Neutro Abs: 4299 cells/uL (ref 1500–7800)
Neutrophils Relative %: 58.1 %
Platelets: 304 10*3/uL (ref 140–400)
RBC: 4.62 10*6/uL (ref 3.80–5.10)
RDW: 11.8 % (ref 11.0–15.0)
Total Lymphocyte: 29.9 %
WBC: 7.4 10*3/uL (ref 3.8–10.8)

## 2022-09-28 LAB — DRUG MONITORING, PANEL 8 WITH CONFIRMATION, URINE
6 Acetylmorphine: NEGATIVE ng/mL (ref <10)
Alcohol Metabolites: NEGATIVE ng/mL (ref <500)
Amphetamines: NEGATIVE ng/mL (ref <500)
Benzodiazepines: NEGATIVE ng/mL (ref <100)
Buprenorphine, Urine: NEGATIVE ng/mL (ref <5)
Cocaine Metabolite: NEGATIVE ng/mL (ref <150)
Creatinine: 196.5 mg/dL (ref >=20.0)
MDMA: NEGATIVE ng/mL (ref <500)
Marijuana Metabolite: NEGATIVE ng/mL (ref <20)
Opiates: NEGATIVE ng/mL (ref <100)
Oxidant: NEGATIVE ug/mL (ref <200)
Oxycodone: NEGATIVE ng/mL (ref <100)
pH: 8.6 (ref 4.5–9.0)

## 2022-09-28 LAB — TSH: TSH: 1.33 mIU/L

## 2022-09-28 LAB — HERPES SIMPLEX VIRUS 1 AND 2 (IGG),REFLEX HSV-2 INHIBITION
HAV 1 IGG,TYPE SPECIFIC AB: <0.9 index
HSV 2 IGG,TYPE SPECIFIC AB: <0.9 index

## 2022-09-28 LAB — DM TEMPLATE

## 2022-09-28 NOTE — Telephone Encounter (Signed)
See telephone note.

## 2022-09-28 NOTE — Telephone Encounter (Signed)
Called pt but unable to reach due phone just kept ringing

## 2022-09-29 LAB — CERVICOVAGINAL ANCILLARY ONLY
Bacterial Vaginitis (gardnerella): NEGATIVE
Candida Glabrata: NEGATIVE
Candida Vaginitis: NEGATIVE
Chlamydia: NEGATIVE
Comment: NEGATIVE
Comment: NEGATIVE
Comment: NEGATIVE
Comment: NEGATIVE
Comment: NEGATIVE
Comment: NORMAL
Neisseria Gonorrhea: NEGATIVE
Trichomonas: NEGATIVE

## 2022-09-29 NOTE — Telephone Encounter (Signed)
Called pt but unable to reach.

## 2022-10-04 ENCOUNTER — Ambulatory Visit: Payer: Medicaid Other | Admitting: Family Medicine

## 2022-10-17 ENCOUNTER — Emergency Department (HOSPITAL_BASED_OUTPATIENT_CLINIC_OR_DEPARTMENT_OTHER)
Admission: EM | Admit: 2022-10-17 | Discharge: 2022-10-17 | Disposition: A | Discharge status: Home / Self Care | Payer: Medicaid Other | Attending: Emergency Medicine | Admitting: Emergency Medicine

## 2022-10-17 ENCOUNTER — Other Ambulatory Visit (HOSPITAL_BASED_OUTPATIENT_CLINIC_OR_DEPARTMENT_OTHER): Payer: Self-pay

## 2022-10-17 ENCOUNTER — Encounter (HOSPITAL_BASED_OUTPATIENT_CLINIC_OR_DEPARTMENT_OTHER): Payer: Self-pay

## 2022-10-17 DIAGNOSIS — L299 Pruritus, unspecified: Secondary | ICD-10-CM | POA: Diagnosis present

## 2022-10-17 DIAGNOSIS — T7840XA Allergy, unspecified, initial encounter: Secondary | ICD-10-CM | POA: Insufficient documentation

## 2022-10-17 MED ORDER — DIPHENHYDRAMINE-ZINC ACETATE 2-0.1 % EX CREA
1.0000 | TOPICAL_CREAM | Freq: Three times a day (TID) | CUTANEOUS | 0 refills | Status: DC | PRN
  Filled 2022-10-17: qty 28.4, 10d supply, fill #0

## 2022-10-17 MED ORDER — HYDROCORTISONE 1 % EX CREA
TOPICAL_CREAM | CUTANEOUS | 0 refills | Status: DC
  Filled 2022-10-17: qty 28, 7d supply, fill #0

## 2022-10-17 NOTE — ED Provider Notes (Signed)
[facility] Provider Note   CSN: [id number] Arrival date & time: 10/17/22  0705     History  Chief Complaint  Patient presents with   Head Lice    Elizabeth Ray is a 22 y.o. female.  Patient is a 22 year old female who presents with some itching to her scalp and her back.  She was concerned about possible lice.  She said for the last few days she has noticed some itching on her scalp and some itching to her back and the sides of her neck.  She noticed some bumps on the sides of her neck and back.  She has noticed some white specks in her hair.  She denies any new exposures.  No new products that she has used on her here.  No other rash noted.       Home Medications Prior to Admission medications   Medication Sig Start Date End Date Taking? Authorizing Provider  diphenhydrAMINE-zinc acetate (BENADRYL EXTRA STRENGTH) cream Apply 1 Application topically 3 (three) times daily as needed for itching. 10/17/22  Yes Malvin Johns, MD  hydrocortisone cream 1 % Apply to affected area 2 times daily 10/17/22  Yes Malvin Johns, MD  clindamycin-benzoyl peroxide (BENZACLIN) gel Apply topically 2 (two) times daily. 09/27/22   Terrilyn Saver, NP  levETIRAcetam (KEPPRA) 500 MG tablet Take 1 tablet (500 mg total) by mouth 2 (two) times daily. 10/07/21   Terrilyn Saver, NP  lisdexamfetamine (VYVANSE) 30 MG capsule Take 1 capsule (30 mg total) by mouth daily. 09/27/22   Terrilyn Saver, NP  lisdexamfetamine (VYVANSE) 30 MG capsule Take 1 capsule (30 mg total) by mouth daily. 10/27/22   Terrilyn Saver, NP  lisdexamfetamine (VYVANSE) 30 MG capsule Take 1 capsule (30 mg total) by mouth daily. 11/26/22   Terrilyn Saver, NP  metroNIDAZOLE (METROGEL VAGINAL) 0.75 % vaginal gel Place 1 Applicatorful vaginally 2 (two) times daily. Only if you are experiencing vaginal itching and irritation 03/01/22   Prosperi, Christian H, PA-C      Allergies    Metronidazole and  related    Review of Systems   Review of Systems  Constitutional:  Negative for fever.  HENT:  Negative for facial swelling.   Gastrointestinal:  Negative for vomiting.  Skin:  Positive for rash.    Physical Exam Updated Vital Signs BP 126/76 (BP Location: Right Arm)   Pulse 78   Temp 97.6 F (36.4 C) (Oral)   Resp 16   Ht 5\' 3"  (1.6 m)   Wt 72.6 kg   LMP 09/25/2022 (Approximate)   SpO2 100%   BMI 28.34 kg/m  Physical Exam Constitutional:      Appearance: Normal appearance.  HENT:     Head: Normocephalic and atraumatic.     Comments: Patient has some white specks in her here but they are easily removed and appear to be more consistent with dandruff rather than lice.  I do not see any bugs or nits.  She does have some erythematous papule lesions on the sides of her neck and a few lesions on her back.  There is no other rash.  No rash on her hands or arms.  She does not have any other areas of itching. Cardiovascular:     Rate and Rhythm: Normal rate.  Pulmonary:     Effort: Pulmonary effort is normal.  Skin:    General: Skin is warm and dry.     Findings:  Rash (As described previously) present.  Neurological:     Mental Status: She is alert.     ED Results / Procedures / Treatments   Labs (all labs ordered are listed, but only abnormal results are displayed) Labs Reviewed - No data to display  EKG None  Radiology No results found.  Procedures Procedures    Medications Ordered in ED Medications - No data to display  ED Course/ Medical Decision Making/ A&P                             Medical Decision Making Risk OTC drugs.   Patient presents with a rash to the sides of her neck and her back.  I do not see any evidence of lice although I advised her if she continues to have the symptoms or sees any bugs or nits which were explained to her, she can use over-the-counter treatment.  I suspect she has more of an allergic reaction given the rash on the sides  of her neck and her back.  I do not see any any evidence of scabies.  Will prescribe her Benadryl cream and hydrocortisone cream to use to the areas.  She was encouraged to follow-up with her primary care doctor if her symptoms are improving.  Return precautions were given.  Final Clinical Impression(s) / ED Diagnoses Final diagnoses:  Allergic reaction, initial encounter    Rx / DC Orders ED Discharge Orders          Ordered    diphenhydrAMINE-zinc acetate (BENADRYL EXTRA STRENGTH) cream  3 times daily PRN        10/17/22 0743    hydrocortisone cream 1 %        10/17/22 0743              Malvin Johns, MD 10/17/22 [phone]

## 2022-10-17 NOTE — ED Triage Notes (Signed)
Pt states she thinks she has lice. States has had itching to head and has noticed white specs and "black balls" stuck to hair. Also states breaking out on neck.

## 2022-10-19 ENCOUNTER — Telehealth: Payer: Self-pay

## 2022-10-19 NOTE — Telephone Encounter (Signed)
Transition Care Management Unsuccessful Follow-up Telephone Call  Date of discharge and from where:  [facility] ER 10/17/2022  Attempts:  1st Attempt  Reason for unsuccessful TCM follow-up call:  Missing or invalid number

## 2022-10-20 NOTE — Telephone Encounter (Signed)
Transition Care Management Unsuccessful Follow-up Telephone Call  Date of discharge and from where:  [facility] ER 10/17/2022  Attempts:  2nd Attempt  Reason for unsuccessful TCM follow-up call:  Missing or invalid number

## 2022-10-23 NOTE — Telephone Encounter (Signed)
Transition Care Management Unsuccessful Follow-up Telephone Call  Date of discharge and from where:  [facility] ER 10/17/2022  Attempts:  3rd Attempt  Reason for unsuccessful TCM follow-up call:  Missing or invalid number

## 2022-10-24 ENCOUNTER — Other Ambulatory Visit (HOSPITAL_BASED_OUTPATIENT_CLINIC_OR_DEPARTMENT_OTHER): Payer: Self-pay

## 2022-11-06 ENCOUNTER — Encounter: Payer: Self-pay | Admitting: Family Medicine

## 2022-11-08 ENCOUNTER — Ambulatory Visit: Payer: Medicaid Other | Admitting: Family Medicine

## 2022-11-08 ENCOUNTER — Other Ambulatory Visit (HOSPITAL_COMMUNITY)
Admission: RE | Admit: 2022-11-08 | Discharge: 2022-11-08 | Disposition: A | Discharge status: Home / Self Care | Payer: Medicaid Other | Source: Ambulatory Visit | Attending: Family Medicine | Admitting: Family Medicine

## 2022-11-08 ENCOUNTER — Encounter: Payer: Self-pay | Admitting: Family Medicine

## 2022-11-08 VITALS — BP 110/73 | HR 82 | Ht 63.0 in | Wt 167.0 lb

## 2022-11-08 DIAGNOSIS — Z0000 Encounter for general adult medical examination without abnormal findings: Secondary | ICD-10-CM | POA: Diagnosis not present

## 2022-11-08 DIAGNOSIS — Z01419 Encounter for gynecological examination (general) (routine) without abnormal findings: Secondary | ICD-10-CM

## 2022-11-08 DIAGNOSIS — Z113 Encounter for screening for infections with a predominantly sexual mode of transmission: Secondary | ICD-10-CM

## 2022-11-08 NOTE — Addendum Note (Signed)
Addended by: Truett Mainland on: 11/08/2022 03:44 PM   Modules accepted: Level of Service

## 2022-11-08 NOTE — Progress Notes (Signed)
ANNUAL EXAM Patient name: Elizabeth Ray MRN [id number]  Date of birth: [birth year] Chief Complaint:   Annual Exam  History of Present Illness:   Elizabeth Ray is a 22 y.o.  [id number]  female  being seen today for a routine annual exam.  Current complaints: none. Menses normal. Not on contraception  Patient's last menstrual period was 10/30/2022 (approximate).    Last pap 09/2021. Results were:  normal . H/O abnormal pap: no Last mammogram: N/a     12/05/2021   12:12 PM 10/07/2021    8:42 AM 08/18/2018   10:44 AM 10/31/2017    5:03 PM 10/23/2017    3:54 PM  Depression screen PHQ 2/9  Decreased Interest 0 0 2 0 0  Down, Depressed, Hopeless 0 0 2 0 0  PHQ - 2 Score 0 0 4 0 0  Altered sleeping   1 0 0  Tired, decreased energy   2 0 0  Change in appetite   3 0 0  Feeling bad or failure about yourself    2 0 0  Trouble concentrating   2 0 0  Moving slowly or fidgety/restless   1 0 0  PHQ-9 Score   15 0 0        08/18/2018   10:44 AM 10/31/2017    5:03 PM 08/24/2017    4:18 PM 06/12/2017   11:25 AM  GAD 7 : Generalized Anxiety Score  Nervous, Anxious, on Edge 2 0 1 2  Control/stop worrying '2 3 2 2  '$ Worry too much - different things '2 3 1 2  '$ Trouble relaxing 2 3 0 2  Restless 2 0 0 2  Easily annoyed or irritable '3 1 1 2  '$ Afraid - awful might happen 2 0 0 1  Total GAD 7 Score '15 10 5 13     '$ Review of Systems:   Pertinent items are noted in HPI Denies any headaches, blurred vision, fatigue, shortness of breath, chest pain, abdominal pain, abnormal vaginal discharge/itching/odor/irritation, problems with periods, bowel movements, urination, or intercourse unless otherwise stated above. Pertinent History Reviewed:  Reviewed past medical,surgical, social and family history.  Reviewed problem list, medications and allergies. Physical Assessment:   Vitals:   11/08/22 1403  BP: 110/73  Pulse: 82  Weight: 167 lb (75.8 kg)  Height: '5\' 3"'$  (1.6 m)  Body mass index is 29.58  kg/m.        Physical Examination:   General appearance - well appearing, and in no distress  Mental status - alert, oriented to person, place, and time  Psych:  She has a normal mood and affect  Skin - warm and dry, normal color, no suspicious lesions noted  Chest - effort normal, all lung fields clear to auscultation bilaterally  Heart - normal rate and regular rhythm  Neck:  midline trachea, no thyromegaly or nodules  Breasts - breasts appear normal, no suspicious masses, no skin or nipple changes or axillary nodes  Abdomen - soft, nontender, nondistended, no masses or organomegaly  Pelvic - VULVA: normal appearing vulva with no masses, tenderness or lesions  VAGINA: normal appearing vagina with normal color and discharge, no lesions  CERVIX: normal appearing cervix without discharge or lesions, no CMT  Thin prep pap is done with HR HPV cotesting  UTERUS: uterus is felt to be normal size, shape, consistency and nontender   ADNEXA: No adnexal masses or tenderness noted.  Extremities:  No swelling or varicosities noted  Chaperone present  for exam  Assessment & Plan:  1. Well woman exam with routine gynecological exam Would like PAP today. - Cytology - PAP( [place])  2. Screening for STD (sexually transmitted disease) - HIV antibody (with reflex) - RPR - Hepatitis B Surface AntiGEN - Hepatitis C Antibody     No orders of the defined types were placed in this encounter.   Meds: No orders of the defined types were placed in this encounter.   Follow-up: No follow-ups on file.  Truett Mainland, DO 11/08/2022 2:33 PM

## 2022-11-09 LAB — HEPATITIS B SURFACE ANTIGEN: Hepatitis B Surface Ag: NEGATIVE

## 2022-11-09 LAB — HIV ANTIBODY (ROUTINE TESTING W REFLEX): HIV Screen 4th Generation wRfx: NONREACTIVE

## 2022-11-09 LAB — HEPATITIS C ANTIBODY: Hep C Virus Ab: NONREACTIVE

## 2022-11-09 LAB — RPR: RPR Ser Ql: NONREACTIVE

## 2022-11-10 LAB — CYTOLOGY - PAP
Chlamydia: NEGATIVE
Comment: NEGATIVE
Comment: NEGATIVE
Comment: NORMAL
Diagnosis: NEGATIVE
Neisseria Gonorrhea: NEGATIVE
Trichomonas: NEGATIVE

## 2022-11-19 ENCOUNTER — Emergency Department (HOSPITAL_BASED_OUTPATIENT_CLINIC_OR_DEPARTMENT_OTHER)
Admission: EM | Admit: 2022-11-19 | Discharge: 2022-11-19 | Discharge status: Against Medical Advice | Payer: Medicaid Other | Attending: Emergency Medicine | Admitting: Emergency Medicine

## 2022-11-19 ENCOUNTER — Encounter (HOSPITAL_BASED_OUTPATIENT_CLINIC_OR_DEPARTMENT_OTHER): Payer: Self-pay | Admitting: Emergency Medicine

## 2022-11-19 ENCOUNTER — Other Ambulatory Visit: Payer: Self-pay

## 2022-11-19 DIAGNOSIS — Z5321 Procedure and treatment not carried out due to patient leaving prior to being seen by health care provider: Secondary | ICD-10-CM | POA: Diagnosis not present

## 2022-11-19 DIAGNOSIS — N939 Abnormal uterine and vaginal bleeding, unspecified: Secondary | ICD-10-CM | POA: Diagnosis not present

## 2022-11-19 LAB — CBC
HCT: 37.9 % (ref 36.0–46.0)
Hemoglobin: 12.6 g/dL (ref 12.0–15.0)
MCH: 29.3 pg (ref 26.0–34.0)
MCHC: 33.2 g/dL (ref 30.0–36.0)
MCV: 88.1 fL (ref 80.0–100.0)
Platelets: 290 10*3/uL (ref 150–400)
RBC: 4.3 MIL/uL (ref 3.87–5.11)
RDW: 12.3 % (ref 11.5–15.5)
WBC: 6.5 10*3/uL (ref 4.0–10.5)
nRBC: 0 % (ref 0.0–0.2)

## 2022-11-19 LAB — URINALYSIS, ROUTINE W REFLEX MICROSCOPIC
Bilirubin Urine: NEGATIVE
Glucose, UA: NEGATIVE mg/dL
Ketones, ur: NEGATIVE mg/dL
Leukocytes,Ua: NEGATIVE
Nitrite: NEGATIVE
Protein, ur: NEGATIVE mg/dL
Specific Gravity, Urine: 1.015 (ref 1.005–1.030)
pH: 7.5 (ref 5.0–8.0)

## 2022-11-19 LAB — URINALYSIS, MICROSCOPIC (REFLEX): Bacteria, UA: NONE SEEN

## 2022-11-19 LAB — PREGNANCY, URINE: Preg Test, Ur: NEGATIVE

## 2022-11-19 NOTE — ED Triage Notes (Signed)
Pt c/o abdominal pain vaginal bleeding onset today. Pt is due for cycle 2 days ago.

## 2022-11-19 NOTE — ED Provider Notes (Signed)
[facility] Provider Note   CSN: [id number] Arrival date & time: 11/19/22  1435     History  Chief Complaint  Patient presents with   Vaginal Bleeding    Allysson R Elting is a 22 y.o. female presenting with vaginal spotting that began today.  Patient states to associated intercourse with her partner and had Plan B the morning after.  Patient is concerned the vaginal bleeding is related to the Plan B.  Patient states her period is usually the end of the month and this would be too early.  Patient denied chest pain, shortness of breath, abdominal pain, nausea/vomiting, syncope, change in sensation/motor skills, hematuria, dysuria  Home Medications Prior to Admission medications   Medication Sig Start Date End Date Taking? Authorizing Provider  clindamycin-benzoyl peroxide (BENZACLIN) gel Apply topically 2 (two) times daily. Patient not taking: Reported on 11/08/2022 09/27/22   Terrilyn Saver, NP  diphenhydrAMINE-zinc acetate (BENADRYL EXTRA STRENGTH) cream Apply 1 Application topically 3 (three) times daily as needed for itching. Patient not taking: Reported on 11/08/2022 10/17/22   Malvin Johns, MD  hydrocortisone cream 1 % Apply to affected area 2 times daily Patient not taking: Reported on 11/08/2022 10/17/22   Malvin Johns, MD  levETIRAcetam (KEPPRA) 500 MG tablet Take 1 tablet (500 mg total) by mouth 2 (two) times daily. Patient not taking: Reported on 11/08/2022 10/07/21   Caleen Jobs B, NP  lisdexamfetamine (VYVANSE) 30 MG capsule Take 1 capsule (30 mg total) by mouth daily. Patient not taking: Reported on 11/08/2022 09/27/22   Caleen Jobs B, NP  lisdexamfetamine (VYVANSE) 30 MG capsule Take 1 capsule (30 mg total) by mouth daily. 10/27/22   Terrilyn Saver, NP  lisdexamfetamine (VYVANSE) 30 MG capsule Take 1 capsule (30 mg total) by mouth daily. Patient not taking: Reported on 11/08/2022 11/26/22   Caleen Jobs B, NP  metroNIDAZOLE  (METROGEL VAGINAL) 0.75 % vaginal gel Place 1 Applicatorful vaginally 2 (two) times daily. Only if you are experiencing vaginal itching and irritation Patient not taking: Reported on 11/08/2022 03/01/22   Prosperi, Christian H, PA-C      Allergies    Metronidazole and related    Review of Systems   Review of Systems  Genitourinary:  Positive for vaginal bleeding.  See HPI  Physical Exam Updated Vital Signs BP 129/76 (BP Location: Right Arm)   Pulse 72   Temp 97.9 F (36.6 C) (Oral)   Resp 18   Ht '5\' 3"'$  (1.6 m)   Wt 75.8 kg   LMP 10/30/2022 (Approximate)   SpO2 100%   BMI 29.58 kg/m  Physical Exam Vitals reviewed.  Constitutional:      General: She is not in acute distress. HENT:     Head: Normocephalic and atraumatic.  Eyes:     Extraocular Movements: Extraocular movements intact.     Conjunctiva/sclera: Conjunctivae normal.     Pupils: Pupils are equal, round, and reactive to light.  Cardiovascular:     Rate and Rhythm: Normal rate and regular rhythm.     Pulses: Normal pulses.     Heart sounds: Normal heart sounds.     Comments: 2+ bilateral radial/dorsalis pedis pulses with regular rate Pulmonary:     Effort: Pulmonary effort is normal. No respiratory distress.     Breath sounds: Normal breath sounds.  Abdominal:     Palpations: Abdomen is soft.     Tenderness: There is no abdominal tenderness. There is no guarding  or rebound.  Musculoskeletal:        General: Normal range of motion.     Cervical back: Normal range of motion and neck supple.     Comments: 5 out of 5 bilateral grip/leg extension strength  Skin:    General: Skin is warm and dry.     Capillary Refill: Capillary refill takes less than 2 seconds.  Neurological:     General: No focal deficit present.     Mental Status: She is alert and oriented to person, place, and time.     Comments: Sensation intact in all 4 limbs  Psychiatric:        Mood and Affect: Mood normal.     ED Results /  Procedures / Treatments   Labs (all labs ordered are listed, but only abnormal results are displayed) Labs Reviewed  WET PREP, GENITAL  PREGNANCY, URINE  CBC  URINALYSIS, ROUTINE W REFLEX MICROSCOPIC  GC/CHLAMYDIA PROBE AMP ([place]) NOT AT [facility]    EKG None  Radiology No results found.  Procedures Procedures    Medications Ordered in ED Medications - No data to display  ED Course/ Medical Decision Making/ A&P                             Medical Decision Making Amount and/or Complexity of Data Reviewed Labs: ordered.   Leveta R Delbuono 22 y.o. presented today for vaginal bleeding. Working DDx that I considered at this time includes, but not limited to, ectopic pregnancy, miscarriage, ovarian torsion, menses.  R/o DDx: Cannot be determined at this time  Review of prior external notes: none  Unique Tests and My Interpretation:  UA: Unremarkable CBC: Unremarkable Urine pregnancy: Negative  Discussion with Independent Historian: None  Discussion of Management of Tests: None  Risk: Could not be assessed  Risk Stratification Score: None   Plan: Patient presented for vaginal bleeding. On exam patient was not in distress and stable vitals.  Patient had unremarkable physical exam and states she wanted a pelvic exam just to be sure.  Exam will be conducted along with STI testing with patient's approval.  Pelvic exam cannot be conducted as patient eloped before the exam.  Patient did not have an IV and and had not received any medications before eloping.        Final Clinical Impression(s) / ED Diagnoses Final diagnoses:  None    Rx / DC Orders ED Discharge Orders     None         Elvina Sidle 11/19/22 1949    Lorelle Gibbs, DO 11/19/22 2123

## 2022-11-19 NOTE — ED Notes (Signed)
Pt not found to be in her room and her gown was removed and left on the stretcher. Pt not in the department at this time and provider is aware. She eloped. UTA vital signs at this time.

## 2022-11-19 NOTE — ED Notes (Signed)
ED PA at BS 

## 2022-11-19 NOTE — ED Notes (Signed)
Pt alert, NAD, calm, interactive, resps e/u, speaking in clear complete sentences. Endorses vaginal bleeding. Onset today. Describes as not bright, not dark, clots initially, but not since. Denies pain, NVD, fever, dizziness, sob, HA, urinary or other vaginal sx or other sx.

## 2022-11-20 ENCOUNTER — Encounter (HOSPITAL_BASED_OUTPATIENT_CLINIC_OR_DEPARTMENT_OTHER): Payer: Self-pay | Admitting: Emergency Medicine

## 2022-11-20 ENCOUNTER — Emergency Department (HOSPITAL_BASED_OUTPATIENT_CLINIC_OR_DEPARTMENT_OTHER)
Admission: EM | Admit: 2022-11-20 | Discharge: 2022-11-20 | Disposition: A | Discharge status: Home / Self Care | Payer: Medicaid Other | Attending: Emergency Medicine | Admitting: Emergency Medicine

## 2022-11-20 ENCOUNTER — Emergency Department (HOSPITAL_BASED_OUTPATIENT_CLINIC_OR_DEPARTMENT_OTHER): Payer: Medicaid Other

## 2022-11-20 DIAGNOSIS — B9689 Other specified bacterial agents as the cause of diseases classified elsewhere: Secondary | ICD-10-CM | POA: Diagnosis not present

## 2022-11-20 DIAGNOSIS — N83201 Unspecified ovarian cyst, right side: Secondary | ICD-10-CM | POA: Diagnosis not present

## 2022-11-20 DIAGNOSIS — N760 Acute vaginitis: Secondary | ICD-10-CM | POA: Insufficient documentation

## 2022-11-20 DIAGNOSIS — R102 Pelvic and perineal pain: Secondary | ICD-10-CM | POA: Diagnosis not present

## 2022-11-20 LAB — WET PREP, GENITAL
Sperm: NONE SEEN
Trich, Wet Prep: NONE SEEN
WBC, Wet Prep HPF POC: <10 (ref <10)
Yeast Wet Prep HPF POC: NONE SEEN

## 2022-11-20 LAB — URINALYSIS, ROUTINE W REFLEX MICROSCOPIC
Bilirubin Urine: NEGATIVE
Glucose, UA: NEGATIVE mg/dL
Ketones, ur: NEGATIVE mg/dL
Leukocytes,Ua: NEGATIVE
Nitrite: NEGATIVE
Protein, ur: NEGATIVE mg/dL
Specific Gravity, Urine: 1.025 (ref 1.005–1.030)
pH: 6.5 (ref 5.0–8.0)

## 2022-11-20 LAB — URINALYSIS, MICROSCOPIC (REFLEX)

## 2022-11-20 LAB — PREGNANCY, URINE: Preg Test, Ur: NEGATIVE

## 2022-11-20 MED ORDER — CLINDAMYCIN HCL 300 MG PO CAPS: 300.0000 mg | ORAL_CAPSULE | Freq: Four times a day (QID) | ORAL | 0 refills | Status: AC

## 2022-11-20 NOTE — ED Triage Notes (Signed)
PT reported spotting yesterday and period has not come yet. She took morning after pill Friday the 1st. Preg test negative yesterday. Pelvic and mid back pain. She went GYN and had annual exam on 28th. Negative for STDs but is requesting ultrasound.

## 2022-11-20 NOTE — ED Provider Notes (Signed)
[facility] Provider Note   CSN: [id number] Arrival date & time: 11/20/22  [id number]     History  Chief Complaint  Patient presents with   Pelvic Pain    Elizabeth Ray is a 22 y.o. female.  The history is provided by the patient and medical records.  Pelvic Pain This is a new problem. The current episode started more than 1 week ago. The problem occurs constantly. Associated symptoms include abdominal pain. Pertinent negatives include no chest pain, no headaches and no shortness of breath. Nothing aggravates the symptoms. Nothing relieves the symptoms.       Home Medications Prior to Admission medications   Medication Sig Start Date End Date Taking? Authorizing Provider  clindamycin-benzoyl peroxide (BENZACLIN) gel Apply topically 2 (two) times daily. Patient not taking: Reported on 11/08/2022 09/27/22   Terrilyn Saver, NP  diphenhydrAMINE-zinc acetate (BENADRYL EXTRA STRENGTH) cream Apply 1 Application topically 3 (three) times daily as needed for itching. Patient not taking: Reported on 11/08/2022 10/17/22   Malvin Johns, MD  hydrocortisone cream 1 % Apply to affected area 2 times daily Patient not taking: Reported on 11/08/2022 10/17/22   Malvin Johns, MD  levETIRAcetam (KEPPRA) 500 MG tablet Take 1 tablet (500 mg total) by mouth 2 (two) times daily. Patient not taking: Reported on 11/08/2022 10/07/21   Caleen Jobs B, NP  lisdexamfetamine (VYVANSE) 30 MG capsule Take 1 capsule (30 mg total) by mouth daily. Patient not taking: Reported on 11/08/2022 09/27/22   Caleen Jobs B, NP  lisdexamfetamine (VYVANSE) 30 MG capsule Take 1 capsule (30 mg total) by mouth daily. 10/27/22   Terrilyn Saver, NP  lisdexamfetamine (VYVANSE) 30 MG capsule Take 1 capsule (30 mg total) by mouth daily. Patient not taking: Reported on 11/08/2022 11/26/22   Caleen Jobs B, NP  metroNIDAZOLE (METROGEL VAGINAL) 0.75 % vaginal gel Place 1 Applicatorful vaginally 2 (two)  times daily. Only if you are experiencing vaginal itching and irritation Patient not taking: Reported on 11/08/2022 03/01/22   Prosperi, Christian H, PA-C      Allergies    Metronidazole and related    Review of Systems   Review of Systems  Constitutional:  Negative for chills, fatigue and fever.  HENT:  Negative for congestion.   Respiratory:  Negative for cough, chest tightness and shortness of breath.   Cardiovascular:  Negative for chest pain.  Gastrointestinal:  Positive for abdominal pain. Negative for constipation, diarrhea, nausea and vomiting.  Genitourinary:  Positive for menstrual problem, pelvic pain, vaginal bleeding and vaginal discharge. Negative for dysuria, flank pain, frequency and vaginal pain.  Musculoskeletal:  Negative for back pain, neck pain and neck stiffness.  Skin:  Negative for rash and wound.  Neurological:  Negative for headaches.  Psychiatric/Behavioral:  Negative for agitation.   All other systems reviewed and are negative.   Physical Exam Updated Vital Signs BP 113/77 (BP Location: Right Arm)   Pulse 76   Temp 98.2 F (36.8 C) (Oral)   Resp 17   LMP 10/30/2022 (Approximate)   SpO2 100%  Physical Exam Vitals and nursing note reviewed. Exam conducted with a chaperone present.  Constitutional:      General: She is not in acute distress.    Appearance: She is well-developed. She is not ill-appearing, toxic-appearing or diaphoretic.  HENT:     Head: Normocephalic and atraumatic.     Nose: Nose normal. No congestion or rhinorrhea.     Mouth/Throat:  Mouth: Mucous membranes are moist.     Pharynx: No oropharyngeal exudate.  Eyes:     Conjunctiva/sclera: Conjunctivae normal.     Pupils: Pupils are equal, round, and reactive to light.  Cardiovascular:     Rate and Rhythm: Normal rate.     Heart sounds: No murmur heard. Pulmonary:     Effort: No respiratory distress.     Breath sounds: No stridor. No wheezing, rhonchi or rales.  Chest:      Chest wall: No tenderness.  Abdominal:     General: Abdomen is flat. There is no distension.     Tenderness: There is no abdominal tenderness. There is no right CVA tenderness, left CVA tenderness, guarding or rebound.  Genitourinary:    Vagina: Vaginal discharge present.  Musculoskeletal:        General: No swelling.     Cervical back: Normal range of motion and neck supple. No tenderness.     Right lower leg: No edema.     Left lower leg: No edema.  Skin:    General: Skin is warm.     Coloration: Skin is not pale.     Findings: No erythema or rash.  Neurological:     General: No focal deficit present.     Mental Status: She is alert and oriented to person, place, and time.     Motor: No abnormal muscle tone.     Deep Tendon Reflexes: Reflexes are normal and symmetric.  Psychiatric:        Mood and Affect: Mood normal.     ED Results / Procedures / Treatments   Labs (all labs ordered are listed, but only abnormal results are displayed) Labs Reviewed  WET PREP, GENITAL - Abnormal; Notable for the following components:      Result Value   Clue Cells Wet Prep HPF POC PRESENT (*)    All other components within normal limits  URINALYSIS, ROUTINE W REFLEX MICROSCOPIC - Abnormal; Notable for the following components:   APPearance HAZY (*)    Hgb urine dipstick TRACE (*)    All other components within normal limits  URINALYSIS, MICROSCOPIC (REFLEX) - Abnormal; Notable for the following components:   Bacteria, UA FEW (*)    All other components within normal limits  PREGNANCY, URINE  GC/CHLAMYDIA PROBE AMP ([place]) NOT AT [facility]    EKG None  Radiology US PELVIC COMPLETE W TRANSVAGINAL AND TORSION R/O  Result Date: 11/20/2022 CLINICAL DATA:  Pelvic pain EXAM: TRANSABDOMINAL AND TRANSVAGINAL ULTRASOUND OF PELVIS DOPPLER ULTRASOUND OF OVARIES TECHNIQUE: Both transabdominal and transvaginal ultrasound examinations of the pelvis were performed. Transabdominal technique was  performed for global imaging of the pelvis including uterus, ovaries, adnexal regions, and pelvic cul-de-sac. It was necessary to proceed with endovaginal exam following the transabdominal exam to visualize the endometrium and adnexal structures. Color and duplex Doppler ultrasound was utilized to evaluate blood flow to the ovaries. COMPARISON:  12/29/2021 FINDINGS: Uterus Measurements: 6.9 x 3.8 x 4.6 cm = volume: 62.1 mL. No fibroids or other mass visualized. Endometrium Thickness: 5 mm.  No focal abnormality visualized. Right ovary Measurements: 3.0 x 4.5 x 3.0 cm = volume: 21.0 mL. There is a simple 3.2 x 2.3 x 2.1 cm cyst within the right ovary. Otherwise normal appearance. Left ovary Measurements: 1.3 x 1.6 x 0.8 cm = volume: 0.9 mL. Normal appearance/no adnexal mass. Pulsed Doppler evaluation of both ovaries demonstrates normal low-resistance arterial and venous waveforms. Other findings No abnormal free fluid.  IMPRESSION: 1. 3.2 cm right ovarian benign functional cyst. No follow-up imaging is recommended. Reference: Radiology 2019 Nov;293(2):359-371 2. Otherwise unremarkable pelvic ultrasound. Electronically Signed   By: Randa Ngo M.D.   On: 11/20/2022 14:42    Procedures Procedures    Medications Ordered in ED Medications - No data to display  ED Course/ Medical Decision Making/ A&P                             Medical Decision Making Amount and/or Complexity of Data Reviewed Labs: ordered. Radiology: ordered.  Risk Prescription drug management.    PEJA EBLIN is a 22 y.o. female with a past medical history significant for anxiety, depression, ADHD, previous miscarriage and dilation/evacuation, bipolar disorder, PTSD, migraines, and seizures who presents with pelvic pain and spotting.  Patient reports that 2 weeks ago she had intercourse with her husband and took the morning-after pill on Friday the first.  She says that she has had spotting since and has had some pain over  the last week.  She reports is more aching and soreness and cramping.  She denies dysuria or hematuria.  Denies any nausea, vomiting, constipation, diarrhea.  Denies trauma.  Denies any discharge but does want to be tested for sexually transmitted infection.  Denies history of infections like this.  Denies any chest pain, shortness of breath or palpitations.  Denies any fevers or chills.  Denies any other complaints.  On exam, lungs clear and chest nontender.  Abdomen was nontender on my exam.  Patient has some fine diffuse rash which he reports has had for quite some time and is get a follow-up with her PCP for.  Bowel sounds were appreciated.  Flank and back nontender.  Patient otherwise well-appearing.  We will do a pelvic exam.  Patient was seen yesterday and had basic labs and urinalysis and pregnancy test that were reassuring.  Pregnancy test negative.  Urinalysis does not show UTI.  Metabolic panel and CBC reassuring.  Patient did not stay to get pelvic exam or ultrasound done at that time due to the wait but is amenable to getting pelvic exam and ultrasound done today to rule out abnormalities or concern etiologies of her pain.  She does not think she has history of fibroids or cysts.  Will do a pelvic exam, get the pelvic swabs, and get ultrasound.  If workup reassuring, suspect symptoms are related to the morning-after pill she took 2 weeks ago that has thrown her cycle off and cause some of these pelvic discomforts.  Anticipate reassessment and outpatient GYN follow-up if workup reassuring.  1:06 PM Pelvic exam performed and she did have some right adnexal tenderness but no left adnexal tenderness or cervical motion tenderness.  There was some discharge.  Will see what the swabs show and get pelvic ultrasound.  Ultrasound showed ovarian cyst and swabs revealed vaginal vaginosis.  Patient given antibiotics for the BV and will follow-up with PCP and OB/GYN for pelvic pain.  I suspect that her  symptoms are more related to the contraceptive pill she took after we discussed side effects from that can be vaginal bleeding as well as pain.  Patient will follow-up with her team and understood return precautions.  She had other questions or concerns and was discharged in good condition.        Final Clinical Impression(s) / ED Diagnoses Final diagnoses:  BV (bacterial vaginosis)  Cyst of right ovary  Pelvic pain    Rx / DC Orders ED Discharge Orders          Ordered    clindamycin (CLEOCIN) 300 MG capsule  Every 6 hours        11/20/22 1500           Clinical Impression: 1. BV (bacterial vaginosis)   2. Cyst of right ovary   3. Pelvic pain     Disposition: Discharge  Condition: Good  I have discussed the results, Dx and Tx plan with the pt(& family if present). He/she/they expressed understanding and agree(s) with the plan. Discharge instructions discussed at great length. Strict return precautions discussed and pt &/or family have verbalized understanding of the instructions. No further questions at time of discharge.    Discharge Medication List as of 11/20/2022  3:21 PM     START taking these medications   Details  clindamycin (CLEOCIN) 300 MG capsule Take 1 capsule (300 mg total) by mouth every 6 (six) hours for 7 days., Starting Mon 11/20/2022, Until Mon 11/27/2022, Print        Follow Up: Terrilyn Saver, NP [address] [place] [address] [phone]     your obgyn        Naseem Varden, Gwenyth Allegra, MD 11/20/22 2134

## 2022-11-20 NOTE — Discharge Instructions (Signed)
Your history, exam, workup today did not show concerning findings aside from the bacterial vaginosis on the swab and the small ovarian cyst we discovered on ultrasound.  Otherwise your workup was reassuring yesterday and today.  We feel you are safe for discharge home.  Please follow-up with your OB/GYN and PCP I suspect your symptoms were primarily due to the medication you took several weeks ago.  Please rest and stay hydrated.  If any symptoms change or worsen acutely, please return to the nearest Emergency Department.

## 2022-11-21 LAB — GC/CHLAMYDIA PROBE AMP (~~LOC~~) NOT AT ARMC
Chlamydia: NEGATIVE
Comment: NEGATIVE
Comment: NORMAL
Neisseria Gonorrhea: NEGATIVE

## 2022-12-06 ENCOUNTER — Ambulatory Visit (INDEPENDENT_AMBULATORY_CARE_PROVIDER_SITE_OTHER): Payer: Medicaid Other | Admitting: Family Medicine

## 2022-12-06 ENCOUNTER — Other Ambulatory Visit (HOSPITAL_COMMUNITY)
Admission: RE | Admit: 2022-12-06 | Discharge: 2022-12-06 | Disposition: A | Discharge status: Home / Self Care | Payer: Medicaid Other | Source: Ambulatory Visit | Attending: Family Medicine | Admitting: Family Medicine

## 2022-12-06 ENCOUNTER — Encounter: Payer: Self-pay | Admitting: Family Medicine

## 2022-12-06 VITALS — BP 99/72 | HR 66 | Wt 168.0 lb

## 2022-12-06 DIAGNOSIS — N898 Other specified noninflammatory disorders of vagina: Secondary | ICD-10-CM | POA: Diagnosis not present

## 2022-12-06 DIAGNOSIS — N926 Irregular menstruation, unspecified: Secondary | ICD-10-CM | POA: Diagnosis not present

## 2022-12-06 NOTE — Progress Notes (Signed)
   Subjective:    Patient ID: Elizabeth Ray, female    DOB: [birth year], 22 y.o.   MRN: [id number]  HPI Patient seen for missed period.  She had a period in middle of February.  She took 2 Plan B about a week apart and hasn't had a menses since then. It has been around 6 weeks since her last period. She did get seen in the ER - had US showing a simple ovarian cyst. No pain.   Review of Systems     Objective:   Physical Exam Vitals reviewed.  Constitutional:      Appearance: Normal appearance.  Abdominal:     General: Abdomen is flat.     Palpations: Abdomen is soft.     Tenderness: There is no abdominal tenderness.  Neurological:     Mental Status: She is alert.  Psychiatric:        Mood and Affect: Mood normal.        Behavior: Behavior normal.        Thought Content: Thought content normal.        Judgment: Judgment normal.       Assessment & Plan:  1. Missed period Will wait another 6 weeks before work up. Likely due to Plan B.  Did talk about contraception - she's done well with nexplanon in the past and would like that again.  2. Vaginal discharge She was diagnosed with BV in ED. Still some discharge.

## 2022-12-07 ENCOUNTER — Other Ambulatory Visit: Payer: Self-pay | Admitting: Family Medicine

## 2022-12-07 DIAGNOSIS — B9689 Other specified bacterial agents as the cause of diseases classified elsewhere: Secondary | ICD-10-CM

## 2022-12-07 LAB — CERVICOVAGINAL ANCILLARY ONLY
Bacterial Vaginitis (gardnerella): POSITIVE — AB
Candida Glabrata: NEGATIVE
Candida Vaginitis: NEGATIVE
Chlamydia: NEGATIVE
Comment: NEGATIVE
Comment: NEGATIVE
Comment: NEGATIVE
Comment: NEGATIVE
Comment: NEGATIVE
Comment: NORMAL
Neisseria Gonorrhea: NEGATIVE
Trichomonas: NEGATIVE

## 2022-12-07 MED ORDER — CLINDAMYCIN PHOSPHATE 2 % VA CREA: 1.0000 | TOPICAL_CREAM | Freq: Every day | VAGINAL | 0 refills | Status: AC

## 2022-12-07 MED ORDER — BORIC ACID CRYS: 600.0000 mg | CRYSTALS | Freq: Two times a day (BID) | 0 refills | Status: DC

## 2022-12-19 ENCOUNTER — Encounter: Payer: Self-pay | Admitting: Family Medicine

## 2022-12-20 ENCOUNTER — Other Ambulatory Visit: Payer: Self-pay | Admitting: Family Medicine

## 2022-12-20 ENCOUNTER — Ambulatory Visit: Payer: Medicaid Other | Admitting: Family Medicine

## 2022-12-20 DIAGNOSIS — G40309 Generalized idiopathic epilepsy and epileptic syndromes, not intractable, without status epilepticus: Secondary | ICD-10-CM

## 2022-12-21 MED ORDER — LEVETIRACETAM 500 MG PO TABS: 500.0000 mg | ORAL_TABLET | Freq: Two times a day (BID) | ORAL | 0 refills | Status: DC

## 2022-12-21 NOTE — Telephone Encounter (Signed)
You referred to Neurology, please advise if you want to continue prescribing this?

## 2022-12-28 ENCOUNTER — Encounter: Payer: Self-pay | Admitting: Family Medicine

## 2022-12-28 ENCOUNTER — Ambulatory Visit (HOSPITAL_BASED_OUTPATIENT_CLINIC_OR_DEPARTMENT_OTHER)
Admission: RE | Admit: 2022-12-28 | Discharge: 2022-12-28 | Disposition: A | Discharge status: Home / Self Care | Payer: Medicaid Other | Source: Ambulatory Visit | Attending: Family Medicine | Admitting: Family Medicine

## 2022-12-28 ENCOUNTER — Ambulatory Visit: Payer: Medicaid Other | Admitting: Family Medicine

## 2022-12-28 VITALS — BP 104/52 | HR 75 | Wt 170.0 lb

## 2022-12-28 DIAGNOSIS — N83201 Unspecified ovarian cyst, right side: Secondary | ICD-10-CM

## 2022-12-28 DIAGNOSIS — N911 Secondary amenorrhea: Secondary | ICD-10-CM

## 2022-12-28 DIAGNOSIS — N912 Amenorrhea, unspecified: Secondary | ICD-10-CM

## 2022-12-28 LAB — POCT URINE PREGNANCY: Preg Test, Ur: NEGATIVE

## 2022-12-28 NOTE — Progress Notes (Signed)
   Subjective:    Patient ID: Elizabeth Ray, female    DOB: February 22, 2001, 22 y.o.   MRN: [id number]  HPI Patient seen for secondary amenorrhea. She has been 9 weeks without a menses after taking Plan B about a week apart. She does have some pelvic cramping, but no bleeding or spotting. Denies heat/cold intolerance, weight loss, weight gain, galactorrhea, new medications/herbal supplements.   Review of Systems     Objective:   Physical Exam Vitals reviewed.  Constitutional:      Appearance: Normal appearance.  Abdominal:     General: Abdomen is flat. There is no distension.     Palpations: There is no mass.     Tenderness: There is abdominal tenderness (lower pelvic). There is no guarding or rebound.     Hernia: No hernia is present.  Neurological:     Mental Status: She is alert.  Psychiatric:        Mood and Affect: Mood normal.        Behavior: Behavior normal.        Thought Content: Thought content normal.        Judgment: Judgment normal.        Assessment & Plan:  1. Amenorrhea UPT neg - POCT urine pregnancy  2. Cyst of right ovary Recheck [place] due to continued tenderness - US PELVIC COMPLETE WITH TRANSVAGINAL; Future  3. Secondary amenorrhea Patient desirous to have blood work done. Will check thyroid, testosterone levels, prolactin levels, FSH, estrogens. If these are all normal, then will do progesterone challenge. - Estrogens, Total - FSH - Prolactin - T3, free - T4, free - TestT+TestF+SHBG - TSH - US PELVIC COMPLETE WITH TRANSVAGINAL; Future

## 2022-12-28 NOTE — Progress Notes (Signed)
Patient LMP 10-30-22

## 2022-12-29 MED ORDER — METRONIDAZOLE 0.75 % VA GEL: 1.0000 | Freq: Two times a day (BID) | VAGINAL | 0 refills | Status: DC

## 2022-12-30 ENCOUNTER — Encounter: Payer: Self-pay | Admitting: Family Medicine

## 2023-01-01 LAB — PROLACTIN: Prolactin: 13.5 ng/mL (ref 4.8–33.4)

## 2023-01-01 LAB — T3, FREE: T3, Free: 3.1 pg/mL (ref 2.0–4.4)

## 2023-01-01 LAB — TSH: TSH: 1.36 u[IU]/mL (ref 0.450–4.500)

## 2023-01-01 LAB — ESTROGENS, TOTAL: Estrogen: 659 pg/mL

## 2023-01-01 LAB — TESTT+TESTF+SHBG
Sex Hormone Binding: 73 nmol/L (ref 24.6–122.0)
Testosterone, Free: 4.7 pg/mL — ABNORMAL HIGH (ref 0.0–4.2)
Testosterone, Total, LC/MS: 40.2 ng/dL (ref 10.0–55.0)

## 2023-01-01 LAB — FOLLICLE STIMULATING HORMONE: FSH: 7.4 m[IU]/mL

## 2023-01-01 LAB — T4, FREE: Free T4: 1.43 ng/dL (ref 0.82–1.77)

## 2023-01-02 ENCOUNTER — Other Ambulatory Visit: Payer: Self-pay | Admitting: Family Medicine

## 2023-01-02 ENCOUNTER — Encounter: Payer: Self-pay | Admitting: Family Medicine

## 2023-01-02 MED ORDER — MEDROXYPROGESTERONE ACETATE 10 MG PO TABS: 10.0000 mg | ORAL_TABLET | Freq: Every day | ORAL | 0 refills | Status: DC

## 2023-01-08 ENCOUNTER — Encounter: Payer: Self-pay | Admitting: Family Medicine

## 2023-01-09 MED ORDER — SULFAMETHOXAZOLE-TRIMETHOPRIM 800-160 MG PO TABS: 1.0000 | ORAL_TABLET | Freq: Two times a day (BID) | ORAL | 0 refills | Status: AC

## 2023-01-10 ENCOUNTER — Encounter: Payer: Self-pay | Admitting: Family Medicine

## 2023-01-17 ENCOUNTER — Encounter: Payer: Self-pay | Admitting: Family Medicine

## 2023-01-18 ENCOUNTER — Other Ambulatory Visit: Payer: Self-pay

## 2023-01-18 MED ORDER — METRONIDAZOLE 500 MG PO TABS: 500.0000 mg | ORAL_TABLET | Freq: Two times a day (BID) | ORAL | 0 refills | Status: DC

## 2023-01-24 ENCOUNTER — Ambulatory Visit (INDEPENDENT_AMBULATORY_CARE_PROVIDER_SITE_OTHER): Payer: Medicaid Other | Admitting: Family Medicine

## 2023-01-24 ENCOUNTER — Encounter: Payer: Self-pay | Admitting: Family Medicine

## 2023-01-24 VITALS — BP 113/66 | HR 75 | Ht 63.0 in | Wt 176.0 lb

## 2023-01-24 DIAGNOSIS — R5383 Other fatigue: Secondary | ICD-10-CM

## 2023-01-24 DIAGNOSIS — H5711 Ocular pain, right eye: Secondary | ICD-10-CM | POA: Diagnosis not present

## 2023-01-24 DIAGNOSIS — H539 Unspecified visual disturbance: Secondary | ICD-10-CM | POA: Diagnosis not present

## 2023-01-24 DIAGNOSIS — G43009 Migraine without aura, not intractable, without status migrainosus: Secondary | ICD-10-CM | POA: Diagnosis not present

## 2023-01-24 LAB — CBC WITH DIFFERENTIAL/PLATELET
Basophils Absolute: 0.1 10*3/uL (ref 0.0–0.1)
Basophils Relative: 1 % (ref 0.0–3.0)
Eosinophils Absolute: 0.2 10*3/uL (ref 0.0–0.7)
Eosinophils Relative: 3.9 % (ref 0.0–5.0)
HCT: 38.1 % (ref 36.0–46.0)
Hemoglobin: 12.4 g/dL (ref 12.0–15.0)
Lymphocytes Relative: 26.8 % (ref 12.0–46.0)
Lymphs Abs: 1.7 10*3/uL (ref 0.7–4.0)
MCHC: 32.6 g/dL (ref 30.0–36.0)
MCV: 88.1 fl (ref 78.0–100.0)
Monocytes Absolute: 0.3 10*3/uL (ref 0.1–1.0)
Monocytes Relative: 4.6 % (ref 3.0–12.0)
Neutro Abs: 4.1 10*3/uL (ref 1.4–7.7)
Neutrophils Relative %: 63.7 % (ref 43.0–77.0)
Platelets: 290 10*3/uL (ref 150.0–400.0)
RBC: 4.32 Mil/uL (ref 3.87–5.11)
RDW: 12.4 % (ref 11.5–15.5)
WBC: 6.4 10*3/uL (ref 4.0–10.5)

## 2023-01-24 LAB — COMPREHENSIVE METABOLIC PANEL
ALT: 20 U/L (ref 0–35)
AST: 24 U/L (ref 0–37)
Albumin: 4.2 g/dL (ref 3.5–5.2)
Alkaline Phosphatase: 71 U/L (ref 39–117)
BUN: 7 mg/dL (ref 6–23)
CO2: 30 mEq/L (ref 19–32)
Calcium: 9.5 mg/dL (ref 8.4–10.5)
Chloride: 101 mEq/L (ref 96–112)
Creatinine, Ser: 0.67 mg/dL (ref 0.40–1.20)
GFR: 123.38 mL/min (ref >60.00)
Glucose, Bld: 83 mg/dL (ref 70–99)
Potassium: 4.2 mEq/L (ref 3.5–5.1)
Sodium: 138 mEq/L (ref 135–145)
Total Bilirubin: 0.3 mg/dL (ref 0.2–1.2)
Total Protein: 6.8 g/dL (ref 6.0–8.3)

## 2023-01-24 LAB — VITAMIN B12: Vitamin B-12: 251 pg/mL (ref 211–911)

## 2023-01-24 LAB — TSH: TSH: 1.73 u[IU]/mL (ref 0.35–5.50)

## 2023-01-24 MED ORDER — RIZATRIPTAN BENZOATE 5 MG PO TABS
5.0000 mg | ORAL_TABLET | ORAL | 0 refills | Status: DC | PRN
Start: 2023-01-24 — End: 2024-05-14

## 2023-01-24 NOTE — Patient Instructions (Addendum)
[facility] [place] :  [phone]

## 2023-01-24 NOTE — Progress Notes (Signed)
Acute Office Visit  Subjective:     Patient ID: Elizabeth Ray, female    DOB: 18-Sep-2000, 22 y.o.   MRN: [id number]  Chief Complaint  Patient presents with   Eye Pain   Fatigue          Patient is in today for fatigue and eye discomfort.   Patient states about 3 weeks ago she started noticing some right eye pain that resolved and now feels like some pressure with occasional pulsating feeling. She will sometimes notice some blurred peripheral vision, especially at night. States she should be wearing glasses, but does not. She is scheduled with optometrist next week. She denies any redness, lesions, swelling. Additionally, she reports her fatigue has been getting worse these past few days and she has started having more frequent migraines, almost daily. States she has been under a lot of stress and her sleep cycle has been off with work. Her migraines are usually right frontal and associated with nausea, photo/phonophobia. She does not usually take anything for the pain. States she has not been on migraine meds in the past. She does have a history of seizures, but no recent seizure-like activity. States she has also been noticing some heaviness/tingling to her feet and wrists at times - she is not sure if it is related to her general fatigue, or because she has gained about 20 pounds in the past month due to stress eating. She also started smoking about a pack a day recently, but is aware she should try to quit. Patient denies any chest pain, palpitations, dyspnea, wheezing, edema.     All review of systems negative except what is listed in the HPI      Objective:    BP 113/66   Pulse 75   Ht 5\' 3"  (1.6 m)   Wt 176 lb (79.8 kg)   LMP 10/30/2022 Comment: 10/30/22  SpO2 100%   BMI 31.18 kg/m    Physical Exam Vitals reviewed.  Constitutional:      General: She is not in acute distress.    Appearance: Normal appearance. She is not ill-appearing.  HENT:     Head:  Normocephalic and atraumatic.     Right Ear: Tympanic membrane normal.     Left Ear: Tympanic membrane normal.     Nose: Nose normal.     Mouth/Throat:     Mouth: Mucous membranes are moist.     Pharynx: Oropharynx is clear.  Eyes:     General: Lids are normal.        Right eye: No foreign body.        Left eye: No foreign body.     Extraocular Movements: Extraocular movements intact.     Right eye: Normal extraocular motion and no nystagmus.     Left eye: Normal extraocular motion and no nystagmus.     Conjunctiva/sclera: Conjunctivae normal.     Right eye: Right conjunctiva is not injected.     Left eye: Left conjunctiva is not injected.     Pupils: Pupils are equal, round, and reactive to light.  Neck:     Vascular: No carotid bruit.  Cardiovascular:     Rate and Rhythm: Normal rate and regular rhythm.     Pulses: Normal pulses.     Heart sounds: Normal heart sounds.  Pulmonary:     Effort: Pulmonary effort is normal.     Breath sounds: Normal breath sounds.  Abdominal:     General: Abdomen is  flat. Bowel sounds are normal. There is no distension.     Palpations: Abdomen is soft. There is no mass.     Tenderness: There is no abdominal tenderness. There is no right CVA tenderness, left CVA tenderness, guarding or rebound.  Genitourinary:    Comments: Deferred exam Musculoskeletal:        General: Normal range of motion.     Cervical back: Normal range of motion and neck supple. No tenderness.     Right lower leg: No edema.     Left lower leg: No edema.  Lymphadenopathy:     Cervical: No cervical adenopathy.  Skin:    General: Skin is warm and dry.     Capillary Refill: Capillary refill takes less than 2 seconds.  Neurological:     General: No focal deficit present.     Mental Status: She is alert and oriented to person, place, and time. Mental status is at baseline.     Motor: No weakness.     Coordination: Coordination normal.     Gait: Gait normal.  Psychiatric:         Mood and Affect: Mood normal.        Behavior: Behavior normal.        Thought Content: Thought content normal.        Judgment: Judgment normal.    Vision Screening   Right eye Left eye Both eyes  Without correction 20/50 20/50 20/50   With correction        No results found for any visits on 01/24/23.      Assessment & Plan:   Problem List Items Addressed This Visit     Migraine without aura    Adding some Maxalt to try as abortive therapy She is planning to call her neurologist to get an appointment Discussed stress management, healthy diet, regular exercise, smoking cessation       Relevant Medications   rizatriptan (MAXALT) 5 MG tablet   Other Visit Diagnoses     Vision changes    -  Primary   Pain of right eye       Relevant Orders   Ambulatory referral to Ophthalmology   Fatigue, unspecified type     Starting with labs today Discussed sleepy hygiene, healthy diet, regular exercise     Relevant Orders   Comprehensive metabolic panel   CBC with Differential/Platelet   TSH   Vitamin B12   Iron, TIBC and Ferritin Panel       Meds ordered this encounter  Medications   rizatriptan (MAXALT) 5 MG tablet    Sig: Take 1 tablet (5 mg total) by mouth as needed for migraine. May repeat in 2 hours if needed    Dispense:  10 tablet    Refill:  0    Order Specific Question:   Supervising Provider    Answer:   Danise Edge A [[id number]]    Return if symptoms worsen or fail to improve.  Clayborne Dana, NP

## 2023-01-24 NOTE — Assessment & Plan Note (Signed)
Adding some Maxalt to try as abortive therapy She is planning to call her neurologist to get an appointment Discussed stress management, healthy diet, regular exercise, smoking cessation

## 2023-01-25 LAB — IRON,TIBC AND FERRITIN PANEL
Ferritin: 17 ng/mL (ref 15–150)
Iron Saturation: 24 % (ref 15–55)
Iron: 76 ug/dL (ref 27–159)
Total Iron Binding Capacity: 322 ug/dL (ref 250–450)
UIBC: 246 ug/dL (ref 131–425)

## 2023-02-21 ENCOUNTER — Ambulatory Visit (INDEPENDENT_AMBULATORY_CARE_PROVIDER_SITE_OTHER): Payer: Medicaid Other

## 2023-02-21 ENCOUNTER — Other Ambulatory Visit (HOSPITAL_COMMUNITY)
Admission: RE | Admit: 2023-02-21 | Discharge: 2023-02-21 | Disposition: A | Discharge status: Home / Self Care | Payer: Medicaid Other | Source: Ambulatory Visit | Attending: Family Medicine | Admitting: Family Medicine

## 2023-02-21 VITALS — BP 112/72 | HR 79

## 2023-02-21 DIAGNOSIS — N898 Other specified noninflammatory disorders of vagina: Secondary | ICD-10-CM

## 2023-02-21 NOTE — Progress Notes (Signed)
Patient complaining of vaginal odor for 1-3 days. Patient would like GC/ CHl, trich, bv and yeast performed on her vaginal culture. Armandina Stammer RN

## 2023-02-22 LAB — CERVICOVAGINAL ANCILLARY ONLY
Bacterial Vaginitis (gardnerella): POSITIVE — AB
Candida Glabrata: NEGATIVE
Candida Vaginitis: NEGATIVE
Chlamydia: NEGATIVE
Comment: NEGATIVE
Comment: NEGATIVE
Comment: NEGATIVE
Comment: NEGATIVE
Comment: NEGATIVE
Comment: NORMAL
Neisseria Gonorrhea: NEGATIVE
Trichomonas: NEGATIVE

## 2023-02-23 ENCOUNTER — Encounter: Payer: Self-pay | Admitting: Family Medicine

## 2023-02-23 MED ORDER — METRONIDAZOLE 1.3 % VA GEL: 1.0000 | Freq: Every day | VAGINAL | 0 refills | Status: AC

## 2023-02-23 NOTE — Addendum Note (Signed)
Addended by: Levie Heritage on: 02/23/2023 08:34 AM   Modules accepted: Orders

## 2023-02-26 ENCOUNTER — Other Ambulatory Visit: Payer: Self-pay

## 2023-09-04 ENCOUNTER — Ambulatory Visit: Payer: Medicaid Other | Admitting: Family Medicine

## 2023-09-06 ENCOUNTER — Other Ambulatory Visit: Payer: Self-pay

## 2023-09-06 ENCOUNTER — Emergency Department (HOSPITAL_BASED_OUTPATIENT_CLINIC_OR_DEPARTMENT_OTHER)
Admission: EM | Admit: 2023-09-06 | Discharge: 2023-09-06 | Discharge status: Against Medical Advice | Payer: Medicaid Other | Attending: Family Medicine | Admitting: Family Medicine

## 2023-09-06 ENCOUNTER — Encounter (HOSPITAL_BASED_OUTPATIENT_CLINIC_OR_DEPARTMENT_OTHER): Payer: Self-pay

## 2023-09-06 DIAGNOSIS — W01198A Fall on same level from slipping, tripping and stumbling with subsequent striking against other object, initial encounter: Secondary | ICD-10-CM | POA: Insufficient documentation

## 2023-09-06 DIAGNOSIS — Z20822 Contact with and (suspected) exposure to covid-19: Secondary | ICD-10-CM | POA: Insufficient documentation

## 2023-09-06 DIAGNOSIS — S0990XA Unspecified injury of head, initial encounter: Secondary | ICD-10-CM | POA: Insufficient documentation

## 2023-09-06 DIAGNOSIS — J029 Acute pharyngitis, unspecified: Secondary | ICD-10-CM | POA: Diagnosis not present

## 2023-09-06 DIAGNOSIS — Z5321 Procedure and treatment not carried out due to patient leaving prior to being seen by health care provider: Secondary | ICD-10-CM | POA: Diagnosis not present

## 2023-09-06 LAB — RESP PANEL BY RT-PCR (RSV, FLU A&B, COVID)  RVPGX2
Influenza A by PCR: NEGATIVE
Influenza B by PCR: NEGATIVE
Resp Syncytial Virus by PCR: NEGATIVE
SARS Coronavirus 2 by RT PCR: NEGATIVE

## 2023-09-06 LAB — URINALYSIS, ROUTINE W REFLEX MICROSCOPIC
Bilirubin Urine: NEGATIVE
Glucose, UA: NEGATIVE mg/dL
Ketones, ur: NEGATIVE mg/dL
Nitrite: NEGATIVE
Protein, ur: 30 mg/dL — AB
Specific Gravity, Urine: 1.02 (ref 1.005–1.030)
pH: >=9 (ref 5.0–8.0)

## 2023-09-06 LAB — URINALYSIS, MICROSCOPIC (REFLEX)

## 2023-09-06 LAB — PREGNANCY, URINE: Preg Test, Ur: NEGATIVE

## 2023-09-06 LAB — GROUP A STREP BY PCR: Group A Strep by PCR: NOT DETECTED

## 2023-09-06 NOTE — ED Triage Notes (Signed)
The patient had a fall last night and hit her head. No LOC. She is having a sore throat. She also wants an STI workup and UTI.

## 2023-09-07 ENCOUNTER — Other Ambulatory Visit (HOSPITAL_BASED_OUTPATIENT_CLINIC_OR_DEPARTMENT_OTHER): Payer: Self-pay

## 2023-09-07 ENCOUNTER — Emergency Department (HOSPITAL_BASED_OUTPATIENT_CLINIC_OR_DEPARTMENT_OTHER)
Admission: EM | Admit: 2023-09-07 | Discharge: 2023-09-07 | Disposition: A | Discharge status: Home / Self Care | Payer: Medicaid Other | Attending: Emergency Medicine | Admitting: Emergency Medicine

## 2023-09-07 ENCOUNTER — Emergency Department (HOSPITAL_BASED_OUTPATIENT_CLINIC_OR_DEPARTMENT_OTHER): Payer: Medicaid Other

## 2023-09-07 DIAGNOSIS — S0012XA Contusion of left eyelid and periocular area, initial encounter: Secondary | ICD-10-CM | POA: Diagnosis not present

## 2023-09-07 DIAGNOSIS — Y9301 Activity, walking, marching and hiking: Secondary | ICD-10-CM | POA: Diagnosis not present

## 2023-09-07 DIAGNOSIS — G40309 Generalized idiopathic epilepsy and epileptic syndromes, not intractable, without status epilepticus: Secondary | ICD-10-CM

## 2023-09-07 DIAGNOSIS — S0003XA Contusion of scalp, initial encounter: Secondary | ICD-10-CM | POA: Insufficient documentation

## 2023-09-07 DIAGNOSIS — Z760 Encounter for issue of repeat prescription: Secondary | ICD-10-CM | POA: Insufficient documentation

## 2023-09-07 DIAGNOSIS — B9689 Other specified bacterial agents as the cause of diseases classified elsewhere: Secondary | ICD-10-CM | POA: Insufficient documentation

## 2023-09-07 DIAGNOSIS — W108XXA Fall (on) (from) other stairs and steps, initial encounter: Secondary | ICD-10-CM | POA: Diagnosis not present

## 2023-09-07 DIAGNOSIS — N760 Acute vaginitis: Secondary | ICD-10-CM | POA: Diagnosis not present

## 2023-09-07 DIAGNOSIS — R220 Localized swelling, mass and lump, head: Secondary | ICD-10-CM | POA: Diagnosis not present

## 2023-09-07 DIAGNOSIS — S0990XA Unspecified injury of head, initial encounter: Secondary | ICD-10-CM | POA: Diagnosis not present

## 2023-09-07 DIAGNOSIS — S0993XA Unspecified injury of face, initial encounter: Secondary | ICD-10-CM | POA: Diagnosis not present

## 2023-09-07 DIAGNOSIS — Y92009 Unspecified place in unspecified non-institutional (private) residence as the place of occurrence of the external cause: Secondary | ICD-10-CM

## 2023-09-07 LAB — HIV ANTIBODY (ROUTINE TESTING W REFLEX): HIV Screen 4th Generation wRfx: NONREACTIVE

## 2023-09-07 LAB — WET PREP, GENITAL
Sperm: NONE SEEN
Trich, Wet Prep: NONE SEEN
WBC, Wet Prep HPF POC: <10 (ref <10)
Yeast Wet Prep HPF POC: NONE SEEN

## 2023-09-07 LAB — URINALYSIS, ROUTINE W REFLEX MICROSCOPIC
Bilirubin Urine: NEGATIVE
Glucose, UA: NEGATIVE mg/dL
Hgb urine dipstick: NEGATIVE
Ketones, ur: NEGATIVE mg/dL
Leukocytes,Ua: NEGATIVE
Nitrite: NEGATIVE
Protein, ur: NEGATIVE mg/dL
Specific Gravity, Urine: 1.02 (ref 1.005–1.030)
pH: 8.5 — ABNORMAL HIGH (ref 5.0–8.0)

## 2023-09-07 LAB — CBC WITH DIFFERENTIAL/PLATELET
Abs Immature Granulocytes: 0.03 10*3/uL (ref 0.00–0.07)
Basophils Absolute: 0 10*3/uL (ref 0.0–0.1)
Basophils Relative: 1 %
Eosinophils Absolute: 0.1 10*3/uL (ref 0.0–0.5)
Eosinophils Relative: 2 %
HCT: 37.2 % (ref 36.0–46.0)
Hemoglobin: 12.3 g/dL (ref 12.0–15.0)
Immature Granulocytes: 0 %
Lymphocytes Relative: 17 %
Lymphs Abs: 1.3 10*3/uL (ref 0.7–4.0)
MCH: 29.4 pg (ref 26.0–34.0)
MCHC: 33.1 g/dL (ref 30.0–36.0)
MCV: 88.8 fL (ref 80.0–100.0)
Monocytes Absolute: 0.4 10*3/uL (ref 0.1–1.0)
Monocytes Relative: 6 %
Neutro Abs: 5.8 10*3/uL (ref 1.7–7.7)
Neutrophils Relative %: 74 %
Platelets: 272 10*3/uL (ref 150–400)
RBC: 4.19 MIL/uL (ref 3.87–5.11)
RDW: 12.8 % (ref 11.5–15.5)
WBC: 7.8 10*3/uL (ref 4.0–10.5)
nRBC: 0 % (ref 0.0–0.2)

## 2023-09-07 LAB — BASIC METABOLIC PANEL
Anion gap: 8 (ref 5–15)
BUN: 8 mg/dL (ref 6–20)
CO2: 23 mmol/L (ref 22–32)
Calcium: 9.2 mg/dL (ref 8.9–10.3)
Chloride: 108 mmol/L (ref 98–111)
Creatinine, Ser: 0.65 mg/dL (ref 0.44–1.00)
GFR, Estimated: >60 mL/min (ref >60)
Glucose, Bld: 94 mg/dL (ref 70–99)
Potassium: 3.9 mmol/L (ref 3.5–5.1)
Sodium: 139 mmol/L (ref 135–145)

## 2023-09-07 LAB — PREGNANCY, URINE: Preg Test, Ur: NEGATIVE

## 2023-09-07 MED ORDER — LEVETIRACETAM 500 MG PO TABS
500.0000 mg | ORAL_TABLET | Freq: Two times a day (BID) | ORAL | 0 refills | Status: DC
  Filled 2023-09-07 (×2): qty 60, 30d supply, fill #0

## 2023-09-07 MED ORDER — CLINDAMYCIN HCL 150 MG PO CAPS
300.0000 mg | ORAL_CAPSULE | Freq: Two times a day (BID) | ORAL | 0 refills | Status: AC
Start: 2023-09-07 — End: 2023-09-14
  Filled 2023-09-07: qty 28, 7d supply, fill #0

## 2023-09-07 MED ORDER — FLUCONAZOLE 150 MG PO TABS
150.0000 mg | ORAL_TABLET | Freq: Every day | ORAL | 0 refills | Status: AC | PRN
  Filled 2023-09-07: qty 2, 2d supply, fill #0

## 2023-09-07 NOTE — Discharge Instructions (Signed)
He was seen in the emerged part today after a fall.  Your CT scan did not show any signs of bleeding or broken bones.  You may have experienced a mild concussion.  I have listed the name of a neurologist to call for a follow-up appointment if the symptoms persist.  Please get plenty of rest.  I am treating you for bacterial vaginosis as well.  I have called in an as needed yeast medication in case you develop a yeast infection while taking this antibiotic.  Please follow closely with your primary care doctor to give you a longer-term referral for your Keppra.  You will be called if you need to take additional antibiotics for your other lab tests.

## 2023-09-07 NOTE — ED Triage Notes (Signed)
Pt fell down stairs and hit head multiple times on 12/25. Pt does remember fall but lost consciousness after falling. Denies LOC, and blurred vision.  Requesting STI and UTI work up. Denies vaginal discharge and odor. Complains of vaginal itching, mild discomfort with urination and lower abd pain

## 2023-09-07 NOTE — ED Provider Notes (Signed)
Emergency Department Provider Note   I have reviewed the triage vital signs and the nursing notes.   HISTORY  Chief Complaint Fall   HPI Elizabeth Ray is a 22 y.o. female past history of seizure, compliant with Keppra, presents to the emergency department for evaluation after fall with head injury 2 days prior.  Patient states she members walking down the stairs and fell, striking the back of her head but denies full loss of consciousness.  She states that she "almost passed out" but didn't fully.  She tells me that no one pushed her down the steps or assaulted her.  She also has some bruising around the left eye but tells me "I don't want to talk about it." Denies feeling like she is in imminent danger at home. No vision changes. Fall was witnessed by others who deny any seizure activity. No CP or SOB.   She also describes some lower abdominal discomfort and discharge.  She did have sex recently but used a condom.  Denies specific concern for STI but would like this checked as well.  She also notes that she missed her PCP appointment on the 24th and is requesting a refill on her Keppra.    Past Medical History:  Diagnosis Date   ADHD (attention deficit hyperactivity disorder)    Anxiety    Bipolar and related disorder (HCC) 07/15/2015   Depression    Mental disorder    Seizures (HCC)    up until age 73    Review of Systems  Constitutional: No fever/chills Cardiovascular: Denies chest pain. Respiratory: Denies shortness of breath. Gastrointestinal: Mild lower abdominal pain.  No nausea, no vomiting.  No diarrhea.  No constipation. Genitourinary: Mild dysuria and vaginal discharge.  Musculoskeletal: Negative for back pain. Skin: Negative for rash. Neurological: Positive posterior HA.   ____________________________________________   PHYSICAL EXAM:  VITAL SIGNS: ED Triage Vitals  Encounter Vitals Group     BP 09/07/23 0930 120/75     Pulse Rate 09/07/23 0930 (!) 58      Resp 09/07/23 0936 18     Temp 09/07/23 0936 98.3 F (36.8 C)     Temp Source 09/07/23 0936 Oral     SpO2 09/07/23 0930 98 %   Constitutional: Alert and oriented. Well appearing and in no acute distress. Eyes: Conjunctivae are normal. Mild periorbital ecchymoses surrounding the left eye.  No proptosis. EOMI without entrapment.  Head: Palpable 3 cm scalp hematoma to the occipital scalp. No laceration.  Nose: No congestion/rhinnorhea. Mouth/Throat: Mucous membranes are moist.  Neck: No stridor.   Cardiovascular: Normal rate, regular rhythm. Good peripheral circulation. Grossly normal heart sounds.   Respiratory: Normal respiratory effort.  No retractions. Lungs CTAB. Gastrointestinal: Soft and nontender. No distention.  Musculoskeletal: No lower extremity tenderness nor edema. No gross deformities of extremities. Neurologic:  Normal speech and language. Skin:  Skin is warm, dry and intact. No rash noted.  ____________________________________________   LABS (all labs ordered are listed, but only abnormal results are displayed)  Labs Reviewed  WET PREP, GENITAL - Abnormal; Notable for the following components:      Result Value   Clue Cells Wet Prep HPF POC PRESENT (*)    All other components within normal limits  URINALYSIS, ROUTINE W REFLEX MICROSCOPIC - Abnormal; Notable for the following components:   pH 8.5 (*)    All other components within normal limits  HIV ANTIBODY (ROUTINE TESTING W REFLEX)  PREGNANCY, URINE  BASIC METABOLIC PANEL  CBC WITH DIFFERENTIAL/PLATELET  RPR  GC/CHLAMYDIA PROBE AMP ([facility]) NOT AT [facility]   ____________________________________________  RADIOLOGY  CT Maxillofacial Wo Contrast Result Date: 09/07/2023 CLINICAL DATA:  Facial trauma blunt. Patient fell down stairs 2 days ago. EXAM: CT MAXILLOFACIAL WITHOUT CONTRAST TECHNIQUE: Multidetector CT imaging of the maxillofacial structures was performed. Multiplanar CT image reconstructions were  also generated. RADIATION DOSE REDUCTION: This exam was performed according to the departmental dose-optimization program which includes automated exposure control, adjustment of the mA and/or kV according to patient size and/or use of iterative reconstruction technique. COMPARISON:  CT head without contrast 08/18/2018 FINDINGS: Osseous: No acute fractures are present. The mandible is intact and located. Orbits: To the left supraorbital soft tissue swelling is present without underlying fracture or radiopaque foreign body. Laceration is present. Globes and orbits are otherwise within limits. Sinuses: Moderate mucosal thickening is present in the right maxillary sinus. The paranasal sinuses and mastoid air cells are otherwise clear. Soft tissues: The soft tissues of the face are otherwise within limits. Limited intracranial: Within normal limits. IMPRESSION: 1. Left supraorbital soft tissue swelling without underlying fracture or radiopaque foreign body. 2. Moderate mucosal thickening in the right maxillary sinus. Electronically Signed   By: Marin Roberts M.D.   On: 09/07/2023 11:23   CT Head Wo Contrast Result Date: 09/07/2023 CLINICAL DATA:  Add trauma.  Patient fell down stairs 2 days ago. EXAM: CT HEAD WITHOUT CONTRAST TECHNIQUE: Contiguous axial images were obtained from the base of the skull through the vertex without intravenous contrast. RADIATION DOSE REDUCTION: This exam was performed according to the departmental dose-optimization program which includes automated exposure control, adjustment of the mA and/or kV according to patient size and/or use of iterative reconstruction technique. COMPARISON:  CT head without contrast 08/18/2018 FINDINGS: Brain: No acute infarct, hemorrhage, or mass lesion is present. No significant white matter lesions are present. The ventricles are of normal size. No significant extraaxial fluid collection is present. The brainstem and cerebellum are within normal  limits. Midline structures are within normal limits. Vascular: No hyperdense vessel or unexpected calcification. Skull: Calvarium is intact. No focal lytic or blastic lesions are present. No significant extracranial soft tissue lesion is present. Sinuses/Orbits: Mild mucosal thickening is present in the right maxillary sinus. The globes and orbits are within normal limits. IMPRESSION: 1. Negative CT of the brain. No acute or focal lesion to explain the patient's symptoms. 2. Mild right maxillary sinus disease. Electronically Signed   By: Marin Roberts M.D.   On: 09/07/2023 11:21    ____________________________________________   PROCEDURES  Procedure(s) performed:   Procedures  None  ____________________________________________   INITIAL IMPRESSION / ASSESSMENT AND PLAN / ED COURSE  Pertinent labs & imaging results that were available during my care of the patient were reviewed by me and considered in my medical decision making (see chart for details).   This patient is Presenting for Evaluation of head injury, which does require a range of treatment options, and is a complaint that involves a high risk of morbidity and mortality.  The Differential Diagnoses includes subdural hematoma, epidural hematoma, acute concussion, traumatic subarachnoid hemorrhage, cerebral contusions, etc.  Clinical Laboratory Tests Ordered, included wet prep with clue cells. No yeast. UA without infection. Pregnancy negative.   Radiologic Tests Ordered, included CT head and face. I independently interpreted the images and agree with radiology interpretation.   Cardiac Monitor Tracing which shows NSR.    Social Determinants of Health Risk patient is a smoker.  Medical Decision Making: Summary:  Presents emergency department for evaluation of fall down the steps with head injury.  She also has some left eye bruising.  I question her story about falling down the stairs especially with the black eye on  the left but she declines further discussion about the mechanism of injury.  She assures me that she is feeling safe at home.  I do think that CT imaging of the head and face are indicated.  No midline C-spine tenderness on exam.  Patient also having some vaginal discharge and requesting STI evaluation.  Offered self swab for the symptoms and she consented to HIV and RPR testing as well.  She will follow these results in the MyChart app.  I do not plan to treat empirically.   Reevaluation with update and discussion with patient. CT imaging without significant finding. Discussed need for close follow up with PCP. Gave 30 day refill of Keppra but encouraged close PCP follow up for additional refills.   Patient's presentation is most consistent with acute presentation with potential threat to life or bodily function.   Disposition: discharge  ____________________________________________  FINAL CLINICAL IMPRESSION(S) / ED DIAGNOSES  Final diagnoses:  Injury of head, initial encounter  Contusion of scalp, initial encounter  Fall in home, initial encounter  Bacterial vaginosis     NEW OUTPATIENT MEDICATIONS STARTED DURING THIS VISIT:  Discharge Medication List as of 09/07/2023 11:54 AM     START taking these medications   Details  clindamycin (CLEOCIN) 150 MG capsule Take 2 capsules (300 mg total) by mouth 2 (two) times daily for 7 days., Starting Fri 09/07/2023, Until Fri 09/14/2023, Normal    fluconazole (DIFLUCAN) 150 MG tablet Take 1 tablet (150 mg total) by mouth daily as needed for up to 2 days (if yeast infection symptoms develop while on antibiotics)., Starting Fri 09/07/2023, Until Sun 09/09/2023 at 2359, Normal        Note:  This document was prepared using Dragon voice recognition software and may include unintentional dictation errors.  Alona Bene, MD, [facility]    Vicke Plotner, Arlyss Repress, MD 09/08/23 [phone]

## 2023-09-08 LAB — RPR: RPR Ser Ql: NONREACTIVE

## 2023-09-10 LAB — GC/CHLAMYDIA PROBE AMP (~~LOC~~) NOT AT ARMC
Chlamydia: NEGATIVE
Comment: NEGATIVE
Comment: NORMAL
Neisseria Gonorrhea: NEGATIVE

## 2023-09-14 IMAGING — DX DG FOOT COMPLETE 3+V*L*
3 series · 3 of 3 positions shown · non-contrast
Comparison: Left foot radiograph dated 07/16/2017.

CLINICAL DATA: Fall and trauma to the left foot.

EXAM:
LEFT FOOT - COMPLETE 3+ VIEW

[foot ap]
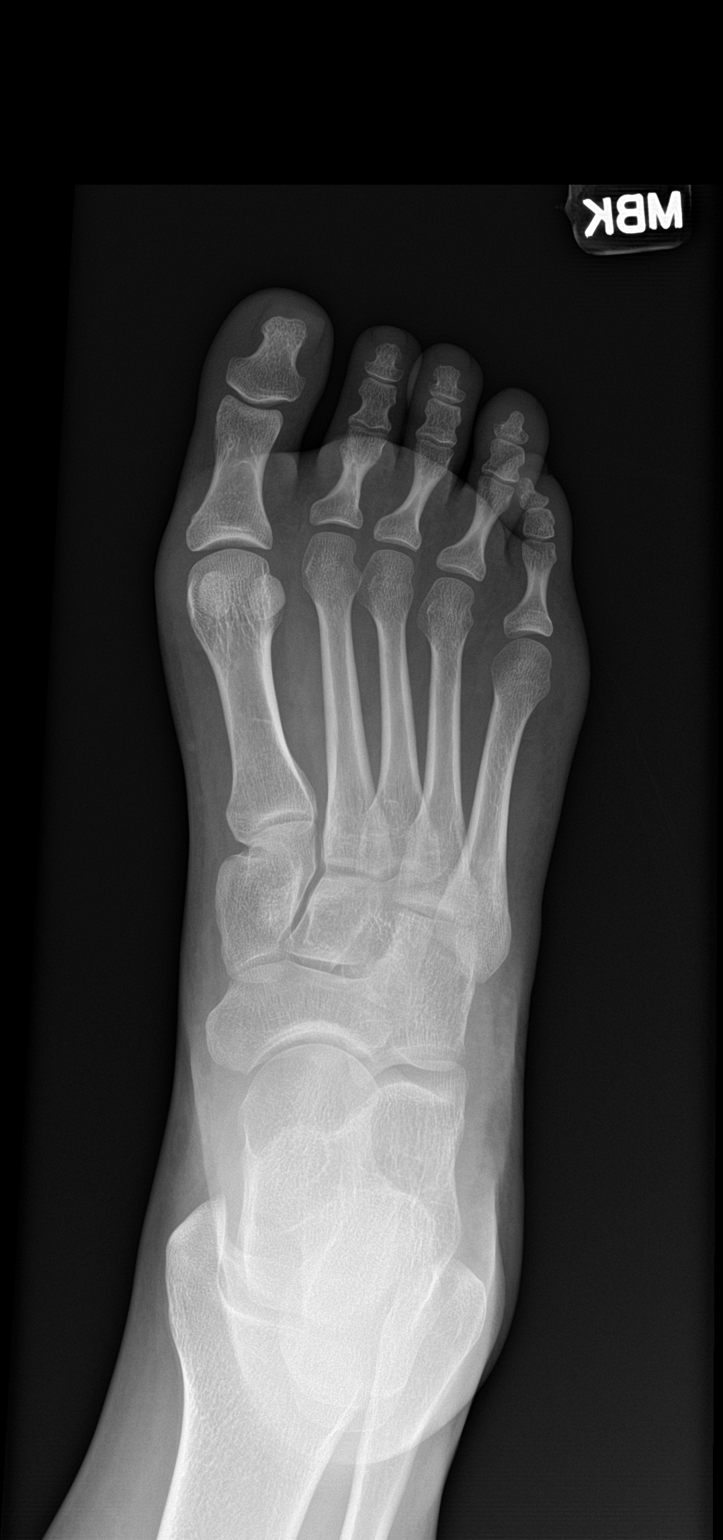

[foot obl]
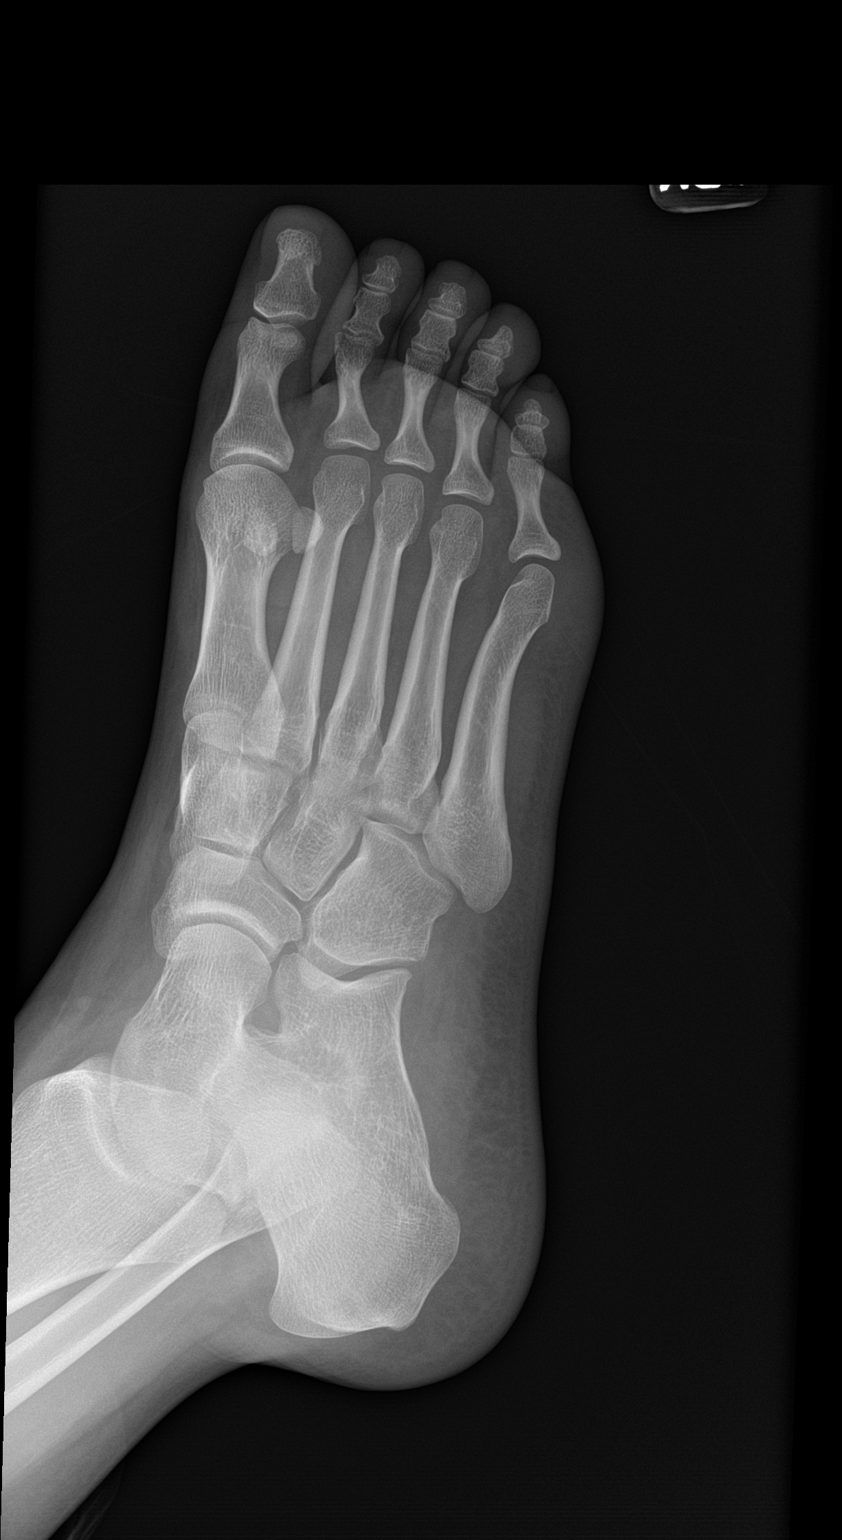

[foot lat]
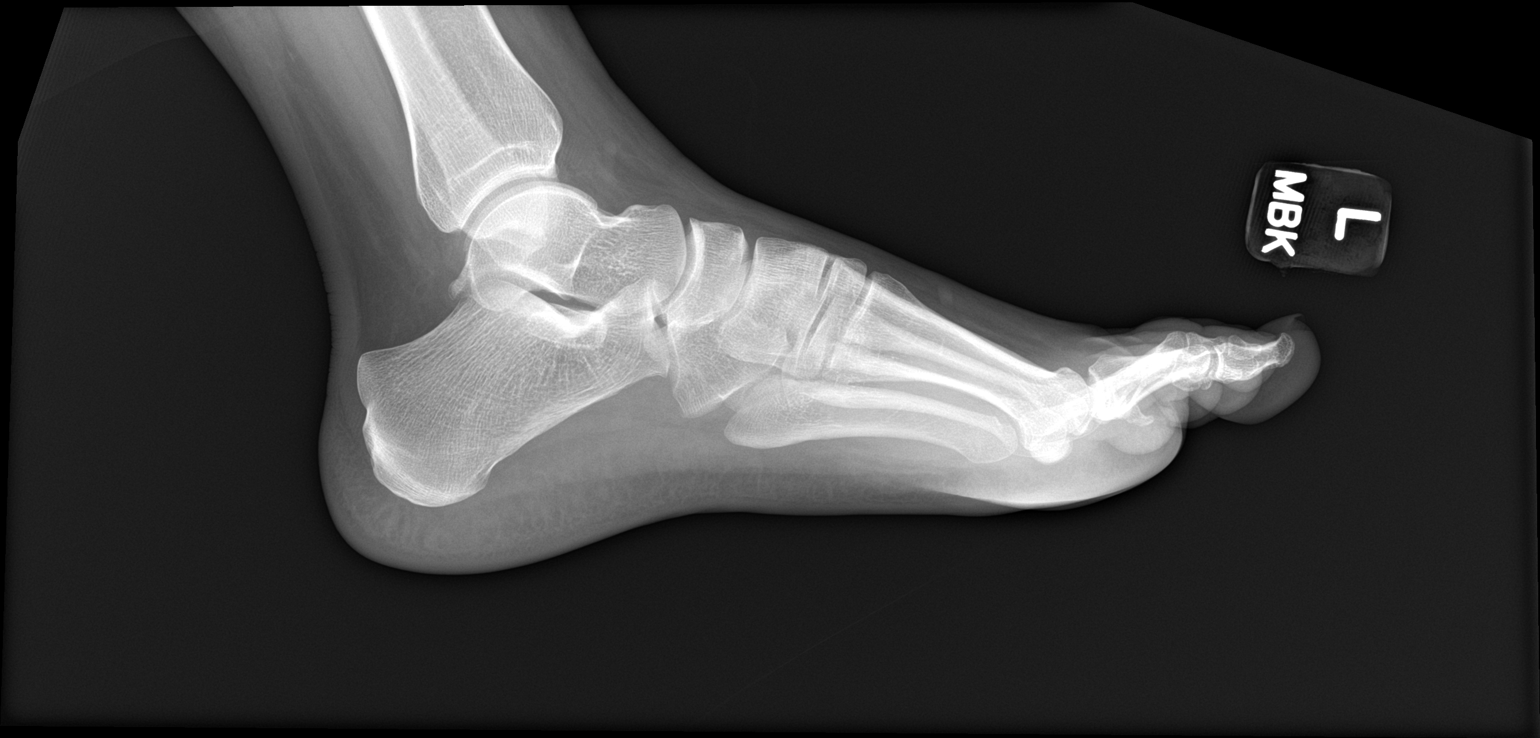

[3 of 3 positions shown; findings below may reference images not displayed]

FINDINGS: There is no evidence of fracture or dislocation. There is no
evidence of arthropathy or other focal bone abnormality. Soft
tissues are unremarkable.
IMPRESSION: Negative.

## 2023-09-27 ENCOUNTER — Other Ambulatory Visit: Payer: Self-pay | Admitting: Family Medicine

## 2023-09-27 ENCOUNTER — Ambulatory Visit: Payer: Medicaid Other | Admitting: Family Medicine

## 2023-09-27 ENCOUNTER — Other Ambulatory Visit (HOSPITAL_COMMUNITY)
Admission: RE | Admit: 2023-09-27 | Discharge: 2023-09-27 | Disposition: A | Discharge status: Home / Self Care | Payer: Medicaid Other | Source: Ambulatory Visit | Attending: Family Medicine | Admitting: Family Medicine

## 2023-09-27 ENCOUNTER — Encounter: Payer: Self-pay | Admitting: Family Medicine

## 2023-09-27 VITALS — BP 116/78 | HR 66 | Temp 97.7°F | Ht 63.0 in | Wt 164.0 lb

## 2023-09-27 DIAGNOSIS — B9689 Other specified bacterial agents as the cause of diseases classified elsewhere: Secondary | ICD-10-CM

## 2023-09-27 DIAGNOSIS — J069 Acute upper respiratory infection, unspecified: Secondary | ICD-10-CM | POA: Diagnosis not present

## 2023-09-27 DIAGNOSIS — N898 Other specified noninflammatory disorders of vagina: Secondary | ICD-10-CM

## 2023-09-27 DIAGNOSIS — N760 Acute vaginitis: Secondary | ICD-10-CM | POA: Diagnosis not present

## 2023-09-27 DIAGNOSIS — Z113 Encounter for screening for infections with a predominantly sexual mode of transmission: Secondary | ICD-10-CM | POA: Diagnosis not present

## 2023-09-27 NOTE — Progress Notes (Signed)
Acute Office Visit  Subjective:     Patient ID: Elizabeth Ray, female    DOB: [birth year], 23 y.o.   MRN: [id number]  Chief Complaint  Patient presents with   Fever     Patient is in today for recent URI, vaginal discharge.     Discussed the use of AI scribe software for clinical note transcription with the patient, who gave verbal consent to proceed.  History of Present Illness   The patient presented with a week-long history of fever, diarrhea, and a sensation of feeling flushed. She also reported a runny nose, coughing, and sneezing, with a significant amount of mucus production. The patient has been managing these symptoms with Mucinex and increased fluid intake, primarily water. Despite these interventions, the patient still experiences intermittent episodes of feeling hot and continues to have a runny nose and mucus production. Overall she is feeling mostly back to normal now.   In addition to these symptoms, the patient also reported a week-long history of vaginal itching and discharge. This started around the same time as the fever and other symptoms. The patient had unprotected sexual intercourse a few days prior to the onset of these symptoms and expressed concern about potential sexually transmitted diseases (STDs) and pregnancy. The patient's last menstrual period was at the beginning of the month, lasting for six days. The patient has regular menstrual cycles and was advised to take a pregnancy test if the next period is missed.  The patient has tried Monistat for the vaginal itching and discharge, which provided some relief.               All review of systems negative except what is listed in the HPI      Objective:    BP 116/78   Pulse 66   Temp 97.7 F (36.5 C) (Oral)   Ht 5\' 3"  (1.6 m)   Wt 164 lb (74.4 kg)   LMP  (LMP Unknown)   SpO2 100%   BMI 29.05 kg/m    Physical Exam Vitals reviewed.  Constitutional:      Appearance: Normal  appearance.  HENT:     Mouth/Throat:     Pharynx: Posterior oropharyngeal erythema present.     Tonsils: No tonsillar exudate or tonsillar abscesses. 0 on the right. 0 on the left.  Cardiovascular:     Rate and Rhythm: Normal rate and regular rhythm.     Heart sounds: Normal heart sounds.  Pulmonary:     Effort: Pulmonary effort is normal.     Breath sounds: Normal breath sounds.  Genitourinary:    Comments: Deferred, self-swab Musculoskeletal:     Cervical back: No tenderness.  Lymphadenopathy:     Cervical: No cervical adenopathy.  Skin:    General: Skin is warm and dry.  Neurological:     Mental Status: She is alert and oriented to person, place, and time.  Psychiatric:        Mood and Affect: Mood normal.        Behavior: Behavior normal.        Thought Content: Thought content normal.        Judgment: Judgment normal.        No results found for any visits on 09/27/23.      Assessment & Plan:   Problem List Items Addressed This Visit   None Visit Diagnoses       Vaginal discharge    -  Primary   Relevant Orders  Cervicovaginal ancillary only     Screen for STD (sexually transmitted disease)       Relevant Orders   Cervicovaginal ancillary only   Ct/GC NAA, Pharyngeal     Viral URI            Upper Respiratory Infection Resolving symptoms of fever, rhinorrhea, and cough. Likely viral etiology. -Continue supportive care with Mucinex and hydration. -Patient aware of signs/symptoms requiring further/urgent evaluation.   Vaginal Itching/Discharge Recent unprotected sexual intercourse. Partial response to Monistat. -Perform STD screening and check for yeast/BV. -If symptoms persist, consider retesting for STDs in a couple of weeks.              No orders of the defined types were placed in this encounter.   Return if symptoms worsen or fail to improve.  Clayborne Dana, NP

## 2023-09-28 ENCOUNTER — Encounter: Payer: Self-pay | Admitting: Family Medicine

## 2023-09-28 LAB — CERVICOVAGINAL ANCILLARY ONLY
Bacterial Vaginitis (gardnerella): POSITIVE — AB
Candida Glabrata: NEGATIVE
Candida Vaginitis: NEGATIVE
Chlamydia: NEGATIVE
Comment: NEGATIVE
Comment: NEGATIVE
Comment: NEGATIVE
Comment: NEGATIVE
Comment: NEGATIVE
Comment: NORMAL
Neisseria Gonorrhea: NEGATIVE
Trichomonas: NEGATIVE

## 2023-09-28 MED ORDER — CLINDAMYCIN PHOSPHATE 2 % VA CREA: 1.0000 | TOPICAL_CREAM | Freq: Every day | VAGINAL | 0 refills | Status: AC

## 2023-09-28 NOTE — Addendum Note (Signed)
Addended by: Hyman Hopes B on: 09/28/2023 01:18 PM   Modules accepted: Orders

## 2023-10-02 ENCOUNTER — Encounter: Payer: Self-pay | Admitting: Family Medicine

## 2023-10-02 LAB — CT/GC NAA, PHARYNGEAL
C TRACH RRNA NPH QL PCR: NEGATIVE
N GONORRHOEA RRNA NPH QL PCR: NEGATIVE

## 2023-10-04 ENCOUNTER — Ambulatory Visit: Payer: Medicaid Other

## 2023-10-04 VITALS — BP 118/74 | HR 78 | Wt 164.0 lb

## 2023-10-04 DIAGNOSIS — Z3202 Encounter for pregnancy test, result negative: Secondary | ICD-10-CM | POA: Diagnosis not present

## 2023-10-04 DIAGNOSIS — N912 Amenorrhea, unspecified: Secondary | ICD-10-CM

## 2023-10-04 LAB — POCT URINE PREGNANCY: Preg Test, Ur: NEGATIVE

## 2023-10-04 NOTE — Progress Notes (Signed)
Patient here for UPT, LMP 09/12/23.  States she had 5 positive results at home.  Negative today.  Instructed patient to wait and see if she gets menses at the end of the month.  Danna Hefty, RN

## 2023-10-15 ENCOUNTER — Ambulatory Visit (INDEPENDENT_AMBULATORY_CARE_PROVIDER_SITE_OTHER): Payer: Medicaid Other

## 2023-10-15 ENCOUNTER — Other Ambulatory Visit: Payer: Self-pay

## 2023-10-15 ENCOUNTER — Encounter (HOSPITAL_BASED_OUTPATIENT_CLINIC_OR_DEPARTMENT_OTHER): Payer: Self-pay | Admitting: Urology

## 2023-10-15 VITALS — BP 135/64 | HR 92 | Ht 63.6 in | Wt 167.0 lb

## 2023-10-15 DIAGNOSIS — O4690 Antepartum hemorrhage, unspecified, unspecified trimester: Secondary | ICD-10-CM | POA: Diagnosis not present

## 2023-10-15 DIAGNOSIS — O26859 Spotting complicating pregnancy, unspecified trimester: Secondary | ICD-10-CM | POA: Insufficient documentation

## 2023-10-15 DIAGNOSIS — Z3A00 Weeks of gestation of pregnancy not specified: Secondary | ICD-10-CM | POA: Diagnosis not present

## 2023-10-15 DIAGNOSIS — Z3A01 Less than 8 weeks gestation of pregnancy: Secondary | ICD-10-CM | POA: Diagnosis not present

## 2023-10-15 DIAGNOSIS — Z3202 Encounter for pregnancy test, result negative: Secondary | ICD-10-CM

## 2023-10-15 DIAGNOSIS — Z5321 Procedure and treatment not carried out due to patient leaving prior to being seen by health care provider: Secondary | ICD-10-CM | POA: Insufficient documentation

## 2023-10-15 DIAGNOSIS — O209 Hemorrhage in early pregnancy, unspecified: Secondary | ICD-10-CM | POA: Diagnosis not present

## 2023-10-15 DIAGNOSIS — N912 Amenorrhea, unspecified: Secondary | ICD-10-CM

## 2023-10-15 LAB — CBC WITH DIFFERENTIAL/PLATELET
Abs Immature Granulocytes: 0.02 10*3/uL (ref 0.00–0.07)
Basophils Absolute: 0.1 10*3/uL (ref 0.0–0.1)
Basophils Relative: 1 %
Eosinophils Absolute: 0.4 10*3/uL (ref 0.0–0.5)
Eosinophils Relative: 5 %
HCT: 37.8 % (ref 36.0–46.0)
Hemoglobin: 12.5 g/dL (ref 12.0–15.0)
Immature Granulocytes: 0 %
Lymphocytes Relative: 29 %
Lymphs Abs: 2.5 10*3/uL (ref 0.7–4.0)
MCH: 29.3 pg (ref 26.0–34.0)
MCHC: 33.1 g/dL (ref 30.0–36.0)
MCV: 88.7 fL (ref 80.0–100.0)
Monocytes Absolute: 0.5 10*3/uL (ref 0.1–1.0)
Monocytes Relative: 6 %
Neutro Abs: 5.2 10*3/uL (ref 1.7–7.7)
Neutrophils Relative %: 59 %
Platelets: 301 10*3/uL (ref 150–400)
RBC: 4.26 MIL/uL (ref 3.87–5.11)
RDW: 12.5 % (ref 11.5–15.5)
WBC: 8.7 10*3/uL (ref 4.0–10.5)
nRBC: 0 % (ref 0.0–0.2)

## 2023-10-15 LAB — BASIC METABOLIC PANEL
Anion gap: 9 (ref 5–15)
BUN: 7 mg/dL (ref 6–20)
CO2: 25 mmol/L (ref 22–32)
Calcium: 9.1 mg/dL (ref 8.9–10.3)
Chloride: 103 mmol/L (ref 98–111)
Creatinine, Ser: 0.87 mg/dL (ref 0.44–1.00)
GFR, Estimated: >60 mL/min (ref >60)
Glucose, Bld: 90 mg/dL (ref 70–99)
Potassium: 3.8 mmol/L (ref 3.5–5.1)
Sodium: 137 mmol/L (ref 135–145)

## 2023-10-15 LAB — URINALYSIS, ROUTINE W REFLEX MICROSCOPIC
Bilirubin Urine: NEGATIVE
Glucose, UA: NEGATIVE mg/dL
Ketones, ur: NEGATIVE mg/dL
Leukocytes,Ua: NEGATIVE
Nitrite: NEGATIVE
Protein, ur: NEGATIVE mg/dL
Specific Gravity, Urine: 1.02 (ref 1.005–1.030)
pH: 7 (ref 5.0–8.0)

## 2023-10-15 LAB — URINALYSIS, MICROSCOPIC (REFLEX): WBC, UA: NONE SEEN WBC/hpf (ref 0–5)

## 2023-10-15 LAB — POCT URINE PREGNANCY: Preg Test, Ur: NEGATIVE

## 2023-10-15 LAB — HCG, QUANTITATIVE, PREGNANCY: hCG, Beta Chain, Quant, S: 27 m[IU]/mL — ABNORMAL HIGH (ref <5)

## 2023-10-15 NOTE — Progress Notes (Signed)
Elizabeth Ray here for a UPT. Pt had a positive upt at home. LMP is 09/12/2023.     UPT in office Negative.    Danna Hefty, RN

## 2023-10-15 NOTE — ED Triage Notes (Signed)
Pt states possible miscarriage  +preg test 2 weeks ago  G-2, P-0. A-0  LMP 09/12/2023  States spotting that started today very light  No pain or cramping

## 2023-10-16 ENCOUNTER — Emergency Department (HOSPITAL_BASED_OUTPATIENT_CLINIC_OR_DEPARTMENT_OTHER)
Admission: EM | Admit: 2023-10-16 | Discharge: 2023-10-16 | Discharge status: Against Medical Advice | Payer: Medicaid Other | Attending: Emergency Medicine | Admitting: Emergency Medicine

## 2023-10-16 ENCOUNTER — Encounter (HOSPITAL_BASED_OUTPATIENT_CLINIC_OR_DEPARTMENT_OTHER): Payer: Self-pay

## 2023-10-16 ENCOUNTER — Emergency Department (HOSPITAL_BASED_OUTPATIENT_CLINIC_OR_DEPARTMENT_OTHER)
Admission: EM | Admit: 2023-10-16 | Discharge: 2023-10-16 | Disposition: A | Discharge status: Home / Self Care | Payer: Medicaid Other | Source: Home / Self Care | Attending: Emergency Medicine | Admitting: Emergency Medicine

## 2023-10-16 DIAGNOSIS — Z3A00 Weeks of gestation of pregnancy not specified: Secondary | ICD-10-CM | POA: Insufficient documentation

## 2023-10-16 DIAGNOSIS — O209 Hemorrhage in early pregnancy, unspecified: Secondary | ICD-10-CM | POA: Diagnosis not present

## 2023-10-16 DIAGNOSIS — O4690 Antepartum hemorrhage, unspecified, unspecified trimester: Secondary | ICD-10-CM | POA: Insufficient documentation

## 2023-10-16 DIAGNOSIS — Z3A01 Less than 8 weeks gestation of pregnancy: Secondary | ICD-10-CM | POA: Diagnosis not present

## 2023-10-16 LAB — HCG, QUANTITATIVE, PREGNANCY: hCG, Beta Chain, Quant, S: 18 m[IU]/mL — ABNORMAL HIGH (ref <5)

## 2023-10-16 NOTE — ED Notes (Signed)
 Discharge paperwork reviewed entirely with patient, including follow up care. Pain was under control. No prescriptions were called in, but all questions were addressed.  Pt verbalized understanding as well as all parties involved. No questions or concerns voiced at the time of discharge. No acute distress noted.   Pt ambulated out to PVA without incident or assistance.  Pt advised they will seek followup care with a specialist and followup with their PCP.

## 2023-10-16 NOTE — ED Provider Notes (Signed)
 [place] [facility] Provider Note   CSN: [id number] Arrival date & time: 10/16/23  9170     History  Chief Complaint  Patient presents with   Vaginal Bleeding    Elizabeth Ray is a 23 y.o. female.  With a history of anxiety, depression, bipolar disorder, ADHD, seizure disorder presenting to the ED for evaluation of vaginal bleeding.  She states she took home pregnancy tests last week and yesterday which were positive so she presented to her gynecologist office yesterday and had negative urine pregnancy tests so she presented to the emergency department yesterday and had a negative urine test and positive blood hCG test.  She left without being seen due to the wait time.  She returns today requesting evaluation.  She denies any lower abdominal pain.  States she is in the process of planning a pregnancy.  Describes the spotting is very light.  She denies any abdominal pain or cramping.  No urinary complaints.  Reports a history of miscarriage.  LMP 09/12/2023.   Vaginal Bleeding      Home Medications Prior to Admission medications   Medication Sig Start Date End Date Taking? Authorizing Provider  levETIRAcetam  (KEPPRA ) 500 MG tablet Take 1 tablet (500 mg total) by mouth 2 (two) times daily. SCHEDULE WITH NEURO FOR REFILLS 09/07/23 10/07/23  Long, Fonda MATSU, MD  rizatriptan  (MAXALT ) 5 MG tablet Take 1 tablet (5 mg total) by mouth as needed for migraine. May repeat in 2 hours if needed Patient not taking: Reported on 10/04/2023 01/24/23   Almarie Waddell NOVAK, NP      Allergies    Metronidazole  and related    Review of Systems   Review of Systems  Genitourinary:  Positive for vaginal bleeding.  All other systems reviewed and are negative.   Physical Exam Updated Vital Signs BP 115/64 (BP Location: Right Arm)   Pulse 86   Temp 98.3 F (36.8 C) (Oral)   Resp 18   Ht 5' 4 (1.626 m)   Wt 75.7 kg   LMP 09/12/2023 (Exact Date)   SpO2 100%   BMI  28.65 kg/m  Physical Exam Vitals and nursing note reviewed.  Constitutional:      General: She is not in acute distress.    Appearance: Normal appearance. She is normal weight. She is not ill-appearing.  HENT:     Head: Normocephalic and atraumatic.  Pulmonary:     Effort: Pulmonary effort is normal. No respiratory distress.     Breath sounds: No wheezing, rhonchi or rales.  Abdominal:     General: Abdomen is flat.     Tenderness: There is no abdominal tenderness.  Musculoskeletal:        General: Normal range of motion.     Cervical back: Neck supple.  Skin:    General: Skin is warm and dry.  Neurological:     Mental Status: She is alert and oriented to person, place, and time.  Psychiatric:        Mood and Affect: Mood normal.        Behavior: Behavior normal.     ED Results / Procedures / Treatments   Labs (all labs ordered are listed, but only abnormal results are displayed) Labs Reviewed  HCG, QUANTITATIVE, PREGNANCY - Abnormal; Notable for the following components:      Result Value   hCG, Beta Chain, Quant, S 18 (*)    All other components within normal limits  EKG None  Radiology No results found.  Procedures Procedures    Medications Ordered in ED Medications - No data to display  ED Course/ Medical Decision Making/ A&P                                 Medical Decision Making Amount and/or Complexity of Data Reviewed Labs: ordered.  This patient presents to the ED for concern of vaginal bleeding, this involves an extensive number of treatment options, and is a complaint that carries with it a high risk of complications and morbidity.  The differential diagnosis includes implantation bleeding, early pregnancy loss, menstruation  My initial workup includes repeat hCG  Additional history obtained from: Nursing notes from this visit.  I reviewed and interpreted labs which include: Yesterday's labs including CBC, BMP, hCG, urinalysis, urine  pregnancy.  Urine pregnancy negative.  CBC without leukocytosis or anemia.  BMP within normal limits.  hCG 27.  Repeat hCG today 18.  Afebrile, hemodynamically stable.  23 year old female presenting to the ED for evaluation of vaginal spotting and positive pregnancy tests.  Pregnancy tests positive greater than 1 week ago.  She had negative urine pregnancy tests yesterday.  She did have a positive hCG of 27.  Repeat hCG today is 18.  She has no abdominal pain.  Overall suspect early pregnancy loss.  Lower suspicion for ectopic.  She was encouraged to follow-up with her primary care provider for continued trending of hCG levels.  She was encouraged to return to the emergency department immediately if she develops any abdominal pain.  She was given return precautions.  Stable at discharge.  At this time there does not appear to be any evidence of an acute emergency medical condition and the patient appears stable for discharge with appropriate outpatient follow up. Diagnosis was discussed with patient who verbalizes understanding of care plan and is agreeable to discharge. I have discussed return precautions with patient who verbalizes understanding. Patient encouraged to follow-up with their PCP within 1 week. All questions answered.  Note: Portions of this report may have been transcribed using voice recognition software. Every effort was made to ensure accuracy; however, inadvertent computerized transcription errors may still be present.         Final Clinical Impression(s) / ED Diagnoses Final diagnoses:  Vaginal bleeding in pregnancy    Rx / DC Orders ED Discharge Orders     None         Edwardo Marsa HERO, PA-C 10/16/23 1143    Ellouise Fine K, DO 10/16/23 1401

## 2023-10-16 NOTE — ED Triage Notes (Signed)
Came here yesterday for vaginal spotting, states getting positive pregnancy tests at home. Had a negative test here with hcg quant of 27. LWBS last night due to wait.

## 2023-10-16 NOTE — Discharge Instructions (Signed)
 You have been seen today for your complaint of vaginal bleeding, positive pregnancy test. Your hCG level was 27 yesterday and 18 today.  This indicates likely early pregnancy loss. Follow up with: Your gynecologist.  You do want to continue trending her hCG level until it returns to undetectable levels. Please seek immediate medical care if you develop any of the following symptoms: You have thoughts of hurting yourself or others. You develop lower abdominal pain. At this time there does not appear to be the presence of an emergent medical condition, however there is always the potential for conditions to change. Please read and follow the below instructions.  Do not take your medicine if  develop an itchy rash, swelling in your mouth or lips, or difficulty breathing; call 911 and seek immediate emergency medical attention if this occurs.  You may review your lab tests and imaging results in their entirety on your MyChart account.  Please discuss all results of fully with your primary care provider and other specialist at your follow-up visit.  Note: Portions of this text may have been transcribed using voice recognition software. Every effort was made to ensure accuracy; however, inadvertent computerized transcription errors may still be present.

## 2023-10-16 NOTE — ED Notes (Signed)
Pt came back, was here from 1900-midnight last night and had labs drawn. Pt now back with same concern for pregnancy risk.

## 2023-10-18 ENCOUNTER — Other Ambulatory Visit: Payer: Medicaid Other

## 2023-10-19 ENCOUNTER — Ambulatory Visit: Payer: Medicaid Other

## 2023-10-19 ENCOUNTER — Other Ambulatory Visit (HOSPITAL_COMMUNITY)
Admission: RE | Admit: 2023-10-19 | Discharge: 2023-10-19 | Disposition: A | Discharge status: Home / Self Care | Payer: Medicaid Other | Source: Ambulatory Visit | Attending: Obstetrics and Gynecology | Admitting: Obstetrics and Gynecology

## 2023-10-19 VITALS — BP 124/88 | HR 110 | Ht 63.0 in | Wt 166.0 lb

## 2023-10-19 DIAGNOSIS — N898 Other specified noninflammatory disorders of vagina: Secondary | ICD-10-CM

## 2023-10-19 NOTE — Progress Notes (Unsigned)
 SUBJECTIVE:  23 y.o. female who desires a STI screen. Denies abnormal vaginal discharge, bleeding or significant pelvic pain. No UTI symptoms. Denies history of known exposure to STD.  Patient's last menstrual period was 10/15/2023.  OBJECTIVE:  She appears well.   ASSESSMENT:  STI Screen   PLAN:  Pt offered STI blood screening-declined GC, chlamydia, and trichomonas probe sent to lab.  Treatment: To be determined once lab results are received.  Patient also requested urine dipstick.  Neg  Pt follow up as needed.   Erminio DELENA Rumps, RN

## 2023-10-22 LAB — CERVICOVAGINAL ANCILLARY ONLY
Bacterial Vaginitis (gardnerella): NEGATIVE
Candida Glabrata: NEGATIVE
Candida Vaginitis: NEGATIVE
Chlamydia: NEGATIVE
Comment: NEGATIVE
Comment: NEGATIVE
Comment: NEGATIVE
Comment: NEGATIVE
Comment: NEGATIVE
Comment: NORMAL
Neisseria Gonorrhea: NEGATIVE
Trichomonas: NEGATIVE

## 2023-10-23 ENCOUNTER — Encounter: Payer: Self-pay | Admitting: Family Medicine

## 2023-10-23 LAB — POCT URINALYSIS DIPSTICK

## 2023-10-24 ENCOUNTER — Encounter: Payer: Self-pay | Admitting: Family Medicine

## 2023-10-24 ENCOUNTER — Other Ambulatory Visit: Payer: Medicaid Other

## 2023-10-24 ENCOUNTER — Other Ambulatory Visit (HOSPITAL_COMMUNITY)
Admission: RE | Admit: 2023-10-24 | Discharge: 2023-10-24 | Disposition: A | Discharge status: Home / Self Care | Payer: Medicaid Other | Source: Ambulatory Visit | Attending: Family Medicine | Admitting: Family Medicine

## 2023-10-24 DIAGNOSIS — N898 Other specified noninflammatory disorders of vagina: Secondary | ICD-10-CM | POA: Diagnosis not present

## 2023-10-24 DIAGNOSIS — R7989 Other specified abnormal findings of blood chemistry: Secondary | ICD-10-CM | POA: Diagnosis not present

## 2023-10-24 LAB — BETA HCG QUANT (REF LAB): hCG Quant: <1 m[IU]/mL

## 2023-10-24 NOTE — Progress Notes (Addendum)
Patient arrived to have HCG level redrawn.  Concerned about vaginal discharge and slight oder.  Culture obtained.  Sent to Lab. Elizabeth Ray

## 2023-10-24 NOTE — Addendum Note (Signed)
Addended by: Billey Chang A on: 10/24/2023 09:28 AM   Modules accepted: Orders

## 2023-10-25 ENCOUNTER — Ambulatory Visit: Payer: Medicaid Other | Admitting: Family Medicine

## 2023-10-25 ENCOUNTER — Encounter: Payer: Self-pay | Admitting: Family Medicine

## 2023-10-25 VITALS — BP 113/72 | HR 80 | Temp 98.3°F | Ht 63.0 in | Wt 169.0 lb

## 2023-10-25 DIAGNOSIS — J0140 Acute pansinusitis, unspecified: Secondary | ICD-10-CM | POA: Diagnosis not present

## 2023-10-25 LAB — CERVICOVAGINAL ANCILLARY ONLY
Bacterial Vaginitis (gardnerella): NEGATIVE
Candida Glabrata: NEGATIVE
Candida Vaginitis: NEGATIVE
Comment: NEGATIVE
Comment: NEGATIVE
Comment: NEGATIVE

## 2023-10-25 MED ORDER — AMOXICILLIN-POT CLAVULANATE 875-125 MG PO TABS: 1.0000 | ORAL_TABLET | Freq: Two times a day (BID) | ORAL | 0 refills | Status: AC

## 2023-10-25 NOTE — Progress Notes (Signed)
Acute Office Visit  Subjective:     Patient ID: Elizabeth Ray, female    DOB: December 21, 2000, 23 y.o.   MRN: [id number]  Chief Complaint  Patient presents with   Sinus Problem    HPI Patient is in today for sinus pain, drainage.   Discussed the use of AI scribe software for clinical note transcription with the patient, who gave verbal consent to proceed.  History of Present Illness   Elizabeth Ray is a 23 year old female who presents with sinus symptoms and ear discomfort.  She has been experiencing sinus-related symptoms for the past one to two months, including white mucus discharge and sinus pressure headaches primarily in the frontal region, nose, and cheeks. She has not received treatment for these symptoms previously and has been using over-the-counter medications like Mucinex.  She reports ear discomfort, specifically noting itchiness and irritation in one ear, which she attributes to the use of Q-tips. There is no significant ear pain, but she mentions drainage and pressure, along with some blood from the ear, which she associates with skin irritation.  She has been experiencing episodes of diarrhea, occurring about twice a day for the past week, which she associates with stress and other ongoing health issues. No other GI symptoms.   She is undergoing follow-up after a miscarriage, with hCG levels being monitored by OB.             ROS All review of systems negative except what is listed in the HPI      Objective:    BP 113/72   Pulse 80   Temp 98.3 F (36.8 C) (Oral)   Ht 5\' 3"  (1.6 m)   Wt 169 lb (76.7 kg)   LMP 10/15/2023   SpO2 100%   BMI 29.94 kg/m    Physical Exam Vitals reviewed.  Constitutional:      Appearance: Normal appearance.  HENT:     Head: Normocephalic and atraumatic.     Right Ear: Tympanic membrane normal.     Left Ear: Tympanic membrane normal.     Mouth/Throat:     Mouth: Mucous membranes are moist.     Pharynx:  Oropharynx is clear.  Cardiovascular:     Rate and Rhythm: Normal rate and regular rhythm.  Pulmonary:     Effort: Pulmonary effort is normal.     Breath sounds: Normal breath sounds.  Musculoskeletal:     Cervical back: Normal range of motion and neck supple.  Skin:    General: Skin is warm and dry.  Neurological:     Mental Status: She is alert and oriented to person, place, and time.  Psychiatric:        Mood and Affect: Mood normal.        Behavior: Behavior normal.        Thought Content: Thought content normal.        Judgment: Judgment normal.     No results found for any visits on 10/25/23.      Assessment & Plan:   Problem List Items Addressed This Visit   None Visit Diagnoses       Acute non-recurrent pansinusitis    -  Primary   Relevant Medications   amoxicillin-clavulanate (AUGMENTIN) 875-125 MG tablet         Sinusitis Chronic sinus symptoms with worsening frontal headaches and white mucus drainage.  -Start Augmentin antibiotic therapy - take with food/water; add mid-day probiotics. -Continue Mucinex and over-the-counter cough and  cold sinus medications. -Add Flonase nasal spray, two sprays in each side once daily. -Consider saline nasal spray during the day. -Stop using Q-tips in ears.   Miscarriage Recent miscarriage with OB follow-up appointments to ensure hCG levels return to zero. -Continue follow-up appointments as scheduled. -Discuss future pregnancy plans with OB/GYN at next appointment.        Meds ordered this encounter  Medications   amoxicillin-clavulanate (AUGMENTIN) 875-125 MG tablet    Sig: Take 1 tablet by mouth 2 (two) times daily for 7 days.    Dispense:  14 tablet    Refill:  0    Supervising Provider:   Danise Edge A [[id number]]    Return if symptoms worsen or fail to improve.  Clayborne Dana, NP

## 2023-10-30 ENCOUNTER — Ambulatory Visit: Payer: Medicaid Other | Admitting: Physician Assistant

## 2023-10-30 ENCOUNTER — Ambulatory Visit: Payer: Self-pay | Admitting: Family Medicine

## 2023-10-30 ENCOUNTER — Other Ambulatory Visit: Payer: Self-pay

## 2023-10-30 ENCOUNTER — Emergency Department (HOSPITAL_BASED_OUTPATIENT_CLINIC_OR_DEPARTMENT_OTHER): Payer: Medicaid Other

## 2023-10-30 ENCOUNTER — Emergency Department (HOSPITAL_BASED_OUTPATIENT_CLINIC_OR_DEPARTMENT_OTHER)
Admission: EM | Admit: 2023-10-30 | Discharge: 2023-10-30 | Disposition: A | Discharge status: Home / Self Care | Payer: Medicaid Other | Attending: Emergency Medicine | Admitting: Emergency Medicine

## 2023-10-30 ENCOUNTER — Encounter (HOSPITAL_BASED_OUTPATIENT_CLINIC_OR_DEPARTMENT_OTHER): Payer: Self-pay | Admitting: Emergency Medicine

## 2023-10-30 DIAGNOSIS — J101 Influenza due to other identified influenza virus with other respiratory manifestations: Secondary | ICD-10-CM | POA: Diagnosis not present

## 2023-10-30 DIAGNOSIS — R300 Dysuria: Secondary | ICD-10-CM | POA: Diagnosis not present

## 2023-10-30 DIAGNOSIS — R059 Cough, unspecified: Secondary | ICD-10-CM | POA: Diagnosis not present

## 2023-10-30 DIAGNOSIS — R0602 Shortness of breath: Secondary | ICD-10-CM | POA: Diagnosis not present

## 2023-10-30 LAB — CBC WITH DIFFERENTIAL/PLATELET
Abs Immature Granulocytes: 0.02 10*3/uL (ref 0.00–0.07)
Basophils Absolute: 0 10*3/uL (ref 0.0–0.1)
Basophils Relative: 0 %
Eosinophils Absolute: 0 10*3/uL (ref 0.0–0.5)
Eosinophils Relative: 0 %
HCT: 38.9 % (ref 36.0–46.0)
Hemoglobin: 13 g/dL (ref 12.0–15.0)
Immature Granulocytes: 0 %
Lymphocytes Relative: 11 %
Lymphs Abs: 0.6 10*3/uL — ABNORMAL LOW (ref 0.7–4.0)
MCH: 29.1 pg (ref 26.0–34.0)
MCHC: 33.4 g/dL (ref 30.0–36.0)
MCV: 87.2 fL (ref 80.0–100.0)
Monocytes Absolute: 0.3 10*3/uL (ref 0.1–1.0)
Monocytes Relative: 5 %
Neutro Abs: 4.6 10*3/uL (ref 1.7–7.7)
Neutrophils Relative %: 84 %
Platelets: 240 10*3/uL (ref 150–400)
RBC: 4.46 MIL/uL (ref 3.87–5.11)
RDW: 11.9 % (ref 11.5–15.5)
WBC: 5.6 10*3/uL (ref 4.0–10.5)
nRBC: 0 % (ref 0.0–0.2)

## 2023-10-30 LAB — COMPREHENSIVE METABOLIC PANEL
ALT: 26 U/L (ref 0–44)
AST: 27 U/L (ref 15–41)
Albumin: 3.9 g/dL (ref 3.5–5.0)
Alkaline Phosphatase: 87 U/L (ref 38–126)
Anion gap: 9 (ref 5–15)
BUN: 6 mg/dL (ref 6–20)
CO2: 24 mmol/L (ref 22–32)
Calcium: 8.8 mg/dL — ABNORMAL LOW (ref 8.9–10.3)
Chloride: 98 mmol/L (ref 98–111)
Creatinine, Ser: 0.71 mg/dL (ref 0.44–1.00)
GFR, Estimated: >60 mL/min (ref >60)
Glucose, Bld: 111 mg/dL — ABNORMAL HIGH (ref 70–99)
Potassium: 3.7 mmol/L (ref 3.5–5.1)
Sodium: 131 mmol/L — ABNORMAL LOW (ref 135–145)
Total Bilirubin: 0.6 mg/dL (ref 0.0–1.2)
Total Protein: 7.2 g/dL (ref 6.5–8.1)

## 2023-10-30 LAB — RESP PANEL BY RT-PCR (RSV, FLU A&B, COVID)  RVPGX2
Influenza A by PCR: POSITIVE — AB
Influenza B by PCR: NEGATIVE
Resp Syncytial Virus by PCR: NEGATIVE
SARS Coronavirus 2 by RT PCR: NEGATIVE

## 2023-10-30 LAB — URINALYSIS, ROUTINE W REFLEX MICROSCOPIC
Bilirubin Urine: NEGATIVE
Glucose, UA: NEGATIVE mg/dL
Hgb urine dipstick: NEGATIVE
Ketones, ur: NEGATIVE mg/dL
Leukocytes,Ua: NEGATIVE
Nitrite: NEGATIVE
Protein, ur: NEGATIVE mg/dL
Specific Gravity, Urine: 1.015 (ref 1.005–1.030)
pH: 7 (ref 5.0–8.0)

## 2023-10-30 MED ORDER — BENZONATATE 100 MG PO CAPS: 100.0000 mg | ORAL_CAPSULE | Freq: Three times a day (TID) | ORAL | 0 refills | Status: DC

## 2023-10-30 MED ORDER — ONDANSETRON 4 MG PO TBDP: ORAL_TABLET | ORAL | 0 refills | Status: DC

## 2023-10-30 NOTE — Discharge Instructions (Addendum)
Take tylenol 2 pills 4 times a day and motrin 4 pills 3 times a day.  Drink plenty of fluids.  Return for worsening shortness of breath, headache, confusion. Follow up with your family doctor.   Your urine does not look infected.

## 2023-10-30 NOTE — ED Provider Notes (Signed)
[place] [facility] AT [facility] Provider Note   CSN: [id number] Arrival date & time: 10/30/23  1928     History  Chief Complaint  Patient presents with   Cough    Elizabeth Ray is a 23 y.o. female.  23 yo F with a chief complaints of cough congestion fevers chills myalgias nausea vomiting dysuria.  This been going on for a couple days she says and then later she tells me that she saw her PCP about a week ago and was started on antibiotics for possible sinus infection.  She started on something that starts with the A.  She does not think that is been helping.  She had significant nausea and vomiting for about 24 hours and that is resolved.  She has been able to eat and drink but less than she would expect.   Cough      Home Medications Prior to Admission medications   Medication Sig Start Date End Date Taking? Authorizing Provider  benzonatate (TESSALON) 100 MG capsule Take 1 capsule (100 mg total) by mouth every 8 (eight) hours. 10/30/23  Yes Melene Plan, DO  ondansetron (ZOFRAN-ODT) 4 MG disintegrating tablet 4mg  ODT q4 hours prn nausea/vomit 10/30/23  Yes Melene Plan, DO  amoxicillin-clavulanate (AUGMENTIN) 875-125 MG tablet Take 1 tablet by mouth 2 (two) times daily for 7 days. 10/25/23 11/01/23  Clayborne Dana, NP  levETIRAcetam (KEPPRA) 500 MG tablet Take 1 tablet (500 mg total) by mouth 2 (two) times daily. SCHEDULE WITH NEURO FOR REFILLS 09/07/23 10/07/23  Long, Arlyss Repress, MD  rizatriptan (MAXALT) 5 MG tablet Take 1 tablet (5 mg total) by mouth as needed for migraine. May repeat in 2 hours if needed 01/24/23   Hyman Hopes B, NP      Allergies    Metronidazole and related    Review of Systems   Review of Systems  Respiratory:  Positive for cough.     Physical Exam Updated Vital Signs BP 128/70 (BP Location: Right Arm)   Pulse 99   Temp 98.6 F (37 C) (Oral)   Resp 20   Ht 5\' 3"  (1.6 m)   Wt 73.9 kg   LMP 10/15/2023   SpO2 93%   BMI  28.87 kg/m  Physical Exam Vitals and nursing note reviewed.  Constitutional:      General: She is not in acute distress.    Appearance: She is well-developed. She is not diaphoretic.  HENT:     Head: Normocephalic and atraumatic.  Eyes:     Pupils: Pupils are equal, round, and reactive to light.  Cardiovascular:     Rate and Rhythm: Normal rate and regular rhythm.     Heart sounds: No murmur heard.    No friction rub. No gallop.  Pulmonary:     Effort: Pulmonary effort is normal.     Breath sounds: No wheezing or rales.  Abdominal:     General: There is no distension.     Palpations: Abdomen is soft.     Tenderness: There is no abdominal tenderness.  Musculoskeletal:        General: No tenderness.     Cervical back: Normal range of motion and neck supple.  Skin:    General: Skin is warm and dry.  Neurological:     Mental Status: She is alert and oriented to person, place, and time.  Psychiatric:        Behavior: Behavior normal.     ED Results /  Procedures / Treatments   Labs (all labs ordered are listed, but only abnormal results are displayed) Labs Reviewed  RESP PANEL BY RT-PCR (RSV, FLU A&B, COVID)  RVPGX2 - Abnormal; Notable for the following components:      Result Value   Influenza A by PCR POSITIVE (*)    All other components within normal limits  CBC WITH DIFFERENTIAL/PLATELET - Abnormal; Notable for the following components:   Lymphs Abs 0.6 (*)    All other components within normal limits  COMPREHENSIVE METABOLIC PANEL - Abnormal; Notable for the following components:   Sodium 131 (*)    Glucose, Bld 111 (*)    Calcium 8.8 (*)    All other components within normal limits  URINALYSIS, ROUTINE W REFLEX MICROSCOPIC    EKG None  Radiology DG Chest 2 View Result Date: 10/30/2023 CLINICAL DATA:  Shortness of breath and cough. EXAM: CHEST - 2 VIEW COMPARISON:  None Available. FINDINGS: The heart size and mediastinal contours are within normal limits. Both  lungs are clear. The visualized skeletal structures are unremarkable. IMPRESSION: No active cardiopulmonary disease. Electronically Signed   By: Elgie Collard M.D.   On: 10/30/2023 20:31    Procedures Procedures    Medications Ordered in ED Medications - No data to display  ED Course/ Medical Decision Making/ A&P                                 Medical Decision Making Amount and/or Complexity of Data Reviewed Labs: ordered. Radiology: ordered.  Risk Prescription drug management.   23 yo F with a chief complaints of cough congestion fever chills myalgias dysuria going on for what sounds like about a week now.  She has been seen by her family doctor the onset of her illness and was started on with sounds like Augmentin.  She is complaining mostly of coughing up a lot of sputum.  She had blood work through the triage process, no anemia, no leukocytosis, no significant electrolyte abnormalities.  She was influenza A positive.  I suspect that this is likely the cause of the majority of her symptoms that she is complaining of dysuria as well.  UA negative for infection.  Will d/c home.  PCP follow up.   The patients results and plan were reviewed and discussed.   Any x-rays performed were independently reviewed by myself.   Differential diagnosis were considered with the presenting HPI.  Medications - No data to display  Vitals:   10/30/23 1933 10/30/23 1935 10/30/23 2230  BP:  125/74 128/70  Pulse:  (!) 114 99  Resp:  17 20  Temp:  98.9 F (37.2 C) 98.6 F (37 C)  TempSrc:  Oral Oral  SpO2:  99% 93%  Weight: 73.9 kg    Height: 5\' 3"  (1.6 m)      Final diagnoses:  Influenza A    Admission/ observation were discussed with the admitting physician, patient and/or family and they are comfortable with the plan.          Final Clinical Impression(s) / ED Diagnoses Final diagnoses:  Influenza A    Rx / DC Orders ED Discharge Orders          Ordered     benzonatate (TESSALON) 100 MG capsule  Every 8 hours        10/30/23 2233    ondansetron (ZOFRAN-ODT) 4 MG disintegrating tablet  10/30/23 2233              Melene Plan, DO 10/30/23 2316

## 2023-10-30 NOTE — ED Triage Notes (Signed)
Patient c/o productive cough, shob, and chest wall pain only with deep inhalation and coughing. Patient was seen 5 days ago and dx with sinus infection, prescribed abx. Patient reports sx are worsening. Patient sent here for pna eval.

## 2023-10-30 NOTE — Telephone Encounter (Signed)
Copied from CRM [id number]. Topic: Clinical - Red Word Triage >> Oct 30, 2023  3:59 PM Alvino Blood C wrote: Red Word that prompted transfer to Nurse Triage: Patient has been having chest and lower back pain since yesterday, with nausea and unable to eat. She says she's also coughing up white phlegm/mucus   Chief Complaint: Chest pain, Feeling worse from recent visit Symptoms: coughing up white mucous, chest pain,  Frequency: chest pain since yesterday and all other symptoms since before five days ago with recent visit Pertinent Negatives: Patient denies difficulty breathing Disposition: [x] ED /[] Urgent Care (no appt availability in office) / [] Appointment(In office/virtual)/ []  [place] [facility]/ [] Home Care/ [] Refused Recommended Disposition /[] [place] [facility]/ []  Follow-up with PCP Additional Notes: Patient called and advised that yesterday she was having symptoms. She wasn't able to keep anything down due to nausea/vomiting.  Started the past couple of days. She states that she has been having a hard cough with white mucous.  Nausea and back pain as well. Her main symptoms today are nausea and chest pain. Patient states that she has coughed up blood twice.  She also states that she had a recent miscarriage.  She had a fever yesterday of 100.7. With these factors, still having a fever, chest pain, back pain, coughing up blood, it is recommended that the patient goes to the emergency room at this time.  She does have family with her that can take her.  Patient is also advised that if anything worsens, to call an ambulance.  Patient verbalized understanding and states that she is going to go to the ER to be checked out.  Reason for Disposition  Coughing up blood  Answer Assessment - Initial Assessment Questions 1. LOCATION: "Where does it hurt?"       middle 2. RADIATION: "Does the pain go anywhere else?" (e.g., into neck, jaw, arms, back)     no 3. ONSET: "When did the chest pain begin?"  (Minutes, hours or days)      yesterday 4. PATTERN: "Does the pain come and go, or has it been constant since it started?"  "Does it get worse with exertion?"      Only with cough 5. DURATION: "How long does it last" (e.g., seconds, minutes, hours)     "Really bad yesterday I am okay today" 6. SEVERITY: "How bad is the pain?"  (e.g., Scale 1-10; mild, moderate, or severe)    - MILD (1-3): doesn't interfere with normal activities     - MODERATE (4-7): interferes with normal activities or awakens from sleep    - SEVERE (8-10): excruciating pain, unable to do any normal activities       2 7. CARDIAC RISK FACTORS: "Do you have any history of heart problems or risk factors for heart disease?" (e.g., angina, prior heart attack; diabetes, high blood pressure, high cholesterol, smoker, or strong family history of heart disease)     no 8. PULMONARY RISK FACTORS: "Do you have any history of lung disease?"  (e.g., blood clots in lung, asthma, emphysema, birth control pills)     no 9. CAUSE: "What do you think is causing the chest pain?"     Coughing possibly 10. OTHER SYMPTOMS: "Do you have any other symptoms?" (e.g., dizziness, nausea, vomiting, sweating, fever, difficulty breathing, cough)       Nausea, temperature of 100.7 yesterday,  11. PREGNANCY: "Is there any chance you are pregnant?" "When was your last menstrual period?"  No--10/15/2023  Protocols used: Chest Pain-A-AH

## 2023-10-31 ENCOUNTER — Ambulatory Visit: Payer: Self-pay | Admitting: Family Medicine

## 2023-10-31 NOTE — Telephone Encounter (Signed)
Copied from CRM [id number]. Topic: Clinical - Medication Question >> Oct 31, 2023  9:20 AM Elizabeth Ray wrote: Reason for CRM: benzonatate (TESSALON) 100 MG capsule patient would like to speak with a nurse regarding her recent Rx.  Chief Complaint: medication question Symptoms: unchanged chest pain and only with coughing, intermittent SOB when coughing and laying down, increased HR, cough, "sticky hard mucus" coming up with cough Frequency: intermittent Pertinent Negatives: Patient denies chest pain at rest or with exertion, trouble sleeping Disposition: [] 911 / [] ED /[] Urgent Care (no appt availability in office) / [] Appointment(In office/virtual)/ []  [place] [facility]/ [] Home Care/ [x] Refused Recommended Disposition /[] [place] [facility]/ [x]  Follow-up with PCP Additional Notes: Pt reporting she went to ED yesterday and diagnosed with Flu A, prescribed Tessalon since "was having chest pains" with coughing, "coughing been okay, gave it to me because having lot of sticky hard mucus when cough it comes up, sticky hurts coming up." Pt is concerned about Tessalon prescription, "was reading, was about to go get it," but asking "is it safe for me to take, I have seizures, read that it does cause seizures. Pt also asking "if should stop taking amoxicillin if I'm taking that, gave to me at first for sinuses and turned into flu." Advised that nurse would send HP message to inquire about continuing or stopping amoxicillin and for pt to receive call back. Pt confirms chest pain is "about the same as yesterday," confirms chest pain only when coughing, denies chest pain at rest or worse with exertion. Pt confirms SOB "not having right now," unsure about SOB being more frequent or more intense than when at hospital but "when coughing or laying down, just uncomfortable, fine sleeping." Pt also reporting "feel like heart beating faster than normal, [ED] told me that's normal" for being sick. Pt also concerned about  blood work, "said sodium at 131 and glucose 111," confirms she had eaten soon before ED. Advised that labwork may have been affected by illness and having not fasted, will send HP message to have providers give feedback and further recommendations if needed. Pt verbalized understanding. Pt asking if she can take mucinex with amoxicillin, advised that if pt been approved to take mucinex in the past with her hx and meds, okay to take with amoxicillin. Pt verbalized understanding. Advised pt be examined for hospital follow-up in next 3 days since chest pain and SOB involved and pt confirms no changes/worsening from when seen in hospital yesterday. Nurse offered virtual appt today or in-person appt tomorrow, pt declines need for appt this week, scheduled hospital follow-up for Monday with PCP. Advised pt call back if any worsening or concerns, go to ED if SOB or chest pain worse. Pt verbalized understanding. Please advise and call pt back regarding feedback on the following: Okay or not to take tessalon with hx of seizures; Stop or continue amoxicillin for pansinusitis since diagnosed with Flu A; Feedback about ED blood work, sodium 131, glucose 111.  Reason for Disposition  [1] Chest pain(s) lasting a few seconds from coughing AND [2] persists > 3 days  Answer Assessment - Initial Assessment Questions 4. PATTERN: "Does the pain come and go, or has it been constant since it started?"  "Does it get worse with exertion?"      About same as when in ED, not at rest or worse with exertion, only with coughing 9. CAUSE: "What do you think is causing the chest pain?"     Coughing from flu, ED stated  10. OTHER SYMPTOMS: "Do you have any other symptoms?" (e.g., dizziness, nausea, vomiting, sweating, fever, difficulty breathing, cough)       Some SOB when coughing or laying down, just uncomfortable, fine sleeping  Protocols used: Chest Pain-A-AH

## 2023-10-31 NOTE — Telephone Encounter (Signed)
Spoke w/ Pt- informed of PCP recommendations. Pt verbalized understanding.  

## 2023-11-01 ENCOUNTER — Ambulatory Visit: Payer: Medicaid Other | Admitting: Family Medicine

## 2023-11-01 ENCOUNTER — Ambulatory Visit: Payer: Self-pay | Admitting: Family Medicine

## 2023-11-01 NOTE — Telephone Encounter (Addendum)
Chief Complaint: Influenza A follow up Symptoms: cough with streaks of blood in mucus, fever, headache/head congestion Frequency: x 4-5 days Pertinent Negatives: Patient denies vomiting Disposition: [] ED /[] Urgent Care (no appt availability in office) / [] Appointment(In office/virtual)/ []  [place] [facility]/ [x] Home Care/ [] Refused Recommended Disposition /[] [place] [facility]/ []  Follow-up with PCP Additional Notes: Patient called in for triage yesterday and refused sooner appointment. When asked what patient's concern was today that caused her to call she states "I don't want to be annoying. I forgot I called yesterday and just haven't had the flu in a long time and wasn't sure what to do."  Advised PCP message from yesterday: I'm not seeing any contraindications for the Tessalon so she can try them. Could also try OTC Robitussin, Delsym, cough drops, honey, lemon, etc. She should be close to finishing the antibiotics anyway, go ahead and finish it out. Yes, okay to add Mucinex to all of this - will be helpful to break up the mucus. Hydration is key - will help thin the mucus.   We can repeat labs at follow-up appointment. Get plenty of rest!   Patient states she has not picked up her tessalon cough meds yet but has started the Muccinex. Patient states her cough improves after taking amoxicillin. Patient keeping hospital follow up appointment for 11/05/23 and verbalizes understanding to call back for any new or worsening symptoms.  Copied from CRM [id number]. Topic: Clinical - Red Word Triage >> Nov 01, 2023  9:21 AM Truddie Crumble wrote: Red Word that prompted transfer to Nurse Triage: patient called stating she went to hospital on Tuesday and she was diagnosed with the flu. Patient was given amoxicillin and she isn't feeling better. Patient is coughing up blood and having chest pain Reason for Disposition  Influenza, questions about  Answer Assessment - Initial Assessment Questions 1.  DIAGNOSIS CONFIRMATION: "When was the influenza diagnosed?" "By whom?" "Did you get a test for it?"     10/30/23 in ED, tested positive for Flu A.  2. MEDICINES: "Were you prescribed any medications for the influenza?"  (e.g., zanamivir [Relenza], oseltamavir [Tamiflu]).      Amoxicillin.  3. ONSET of SYMPTOMS: "When did your symptoms start?"     2/16 to 2/17.  4. SYMPTOMS: "What symptoms are you most concerned about?" (e.g., runny nose, stuffy nose, sore throat, cough, breathing difficulty, fever)     Coughing up blood, headache/congestion (5/10), chest pain (comes and goes after coughing). Patient states there were streaks of blood mixed in with phlegm.  5. COUGH: "How bad is the cough?"     Its around a 5/10. Worse when lying down for sleep or when the medicine starts to wear off.  6. FEVER: "Do you have a fever?" If Yes, ask: "What is your temperature, how was it measured, and when did it start?"     Patient states her fevers have been 100.1 to 102. She states her temperature was 100.2 this morning with thermometer at home.  7. RESPIRATORY DISTRESS: "Are you having any trouble breathing?" If Yes, ask: "Describe your breathing."      "Every so often and not too bad".  8. FLU VACCINE: "Did you receive a flu shot this year?" (e.g., seasonal influenza, H1N1)     Denies.  9. PREGNANCY: "Is there any chance you are pregnant?" "When was your last menstrual period?"     10/15/23.  10. HIGH RISK for COMPLICATIONS: "Do you have any heart or lung problems? Do  you have a weakened immune system?" (e.g., CHF, COPD, asthma, HIV positive, chemotherapy, renal failure, diabetes mellitus, sickle cell anemia)       Denies.  Protocols used: Influenza (Flu) Follow-up Call-A-AH

## 2023-11-05 ENCOUNTER — Encounter: Payer: Self-pay | Admitting: Family Medicine

## 2023-11-05 ENCOUNTER — Ambulatory Visit (INDEPENDENT_AMBULATORY_CARE_PROVIDER_SITE_OTHER): Payer: Medicaid Other | Admitting: Family Medicine

## 2023-11-05 ENCOUNTER — Other Ambulatory Visit (HOSPITAL_COMMUNITY)
Admission: RE | Admit: 2023-11-05 | Discharge: 2023-11-05 | Disposition: A | Discharge status: Home / Self Care | Payer: Medicaid Other | Source: Ambulatory Visit | Attending: Family Medicine | Admitting: Family Medicine

## 2023-11-05 VITALS — BP 112/79 | HR 88 | Ht 63.0 in | Wt 165.0 lb

## 2023-11-05 DIAGNOSIS — J101 Influenza due to other identified influenza virus with other respiratory manifestations: Secondary | ICD-10-CM

## 2023-11-05 DIAGNOSIS — B379 Candidiasis, unspecified: Secondary | ICD-10-CM

## 2023-11-05 DIAGNOSIS — Z113 Encounter for screening for infections with a predominantly sexual mode of transmission: Secondary | ICD-10-CM | POA: Insufficient documentation

## 2023-11-05 DIAGNOSIS — N760 Acute vaginitis: Secondary | ICD-10-CM

## 2023-11-05 DIAGNOSIS — E871 Hypo-osmolality and hyponatremia: Secondary | ICD-10-CM

## 2023-11-05 DIAGNOSIS — Z09 Encounter for follow-up examination after completed treatment for conditions other than malignant neoplasm: Secondary | ICD-10-CM

## 2023-11-05 DIAGNOSIS — B9689 Other specified bacterial agents as the cause of diseases classified elsewhere: Secondary | ICD-10-CM

## 2023-11-05 LAB — BASIC METABOLIC PANEL
BUN: 6 mg/dL (ref 6–23)
CO2: 28 meq/L (ref 19–32)
Calcium: 9.1 mg/dL (ref 8.4–10.5)
Chloride: 102 meq/L (ref 96–112)
Creatinine, Ser: 0.65 mg/dL (ref 0.40–1.20)
GFR: 123.6 mL/min (ref >60.00)
Glucose, Bld: 83 mg/dL (ref 70–99)
Potassium: 4.3 meq/L (ref 3.5–5.1)
Sodium: 139 meq/L (ref 135–145)

## 2023-11-05 NOTE — Progress Notes (Signed)
 Acute Office Visit  Subjective:     Patient ID: Elizabeth Ray, female    DOB: [birth year], 23 y.o.   MRN: [id number]  Chief Complaint  Patient presents with   Medical Management of Chronic Issues    HPI Patient is in today for ER follow-up, recent Flu.   Discussed the use of AI scribe software for clinical note transcription with the patient, who gave verbal consent to proceed.  History of Present Illness   Elizabeth Ray is a 23 year old female who presents with persistent cough and sore throat following a recent flu infection.  She had an emergency room visit on October 30, 2023, due to flu symptoms, primarily cough and congestion, and was diagnosed with Flu A prescribed cough medicine.. She feels much better but still experiences a sore throat and pain under her right shoulder blade, especially when breathing/coughing. Frequent coughing fits may have led to inflammation in the area.  She experiences discomfort in the anal region, describing it as 'uncomfortable' with a sensation of pressure and soreness. She attributes diarrhea to the antibiotic treatment (augmentin for sinus infection the week prior) and noticed redness and a bump in the area using a mirror. No drainage or blood in her stool, but she mentions a smell despite maintaining hygiene. She has not been taking a probiotic but has it available.            ROS All review of systems negative except what is listed in the HPI      Objective:    BP 112/79   Pulse 88   Ht 5\' 3"  (1.6 m)   Wt 165 lb (74.8 kg)   LMP 10/15/2023   SpO2 100%   BMI 29.23 kg/m    Physical Exam Vitals reviewed. Exam conducted with a chaperone present.  Constitutional:      Appearance: Normal appearance.  Cardiovascular:     Rate and Rhythm: Normal rate and regular rhythm.  Pulmonary:     Effort: Pulmonary effort is normal.     Breath sounds: Normal breath sounds. No wheezing, rhonchi or rales.     Comments: Intercostal  tenderness to right posterior, under shoulder blade, suspect costochondritis  Genitourinary:    Rectum: Normal. No anal fissure or external hemorrhoid.     Comments: Mild erythema to anus- recent diarrhea, no masses, lesions  Skin:    General: Skin is warm and dry.  Neurological:     Mental Status: She is alert and oriented to person, place, and time.  Psychiatric:        Mood and Affect: Mood normal.        Behavior: Behavior normal.        Thought Content: Thought content normal.        Judgment: Judgment normal.        No results found for any visits on 11/05/23.      Assessment & Plan:    Influenza Improving symptoms of cough and congestion. Sore throat and cough persisting somewhat -Continue supportive care with fluids, honey, lemon, and ibuprofen. -Advise ibuprofen or Aleve for costochondritis, along with heat, ice, and rest. - Mild hyponatremia in the ED - will recheck today.   Anal discomfort Likely secondary to diarrhea, possibly due to antibiotic use. Patient reports redness and a bump. Unremarkable exam today. -Advise probiotic use to counteract antibiotic effects on gut flora. -Perform sitz baths and warm compresses for relief.        No orders  of the defined types were placed in this encounter.   Return if symptoms worsen or fail to improve.  Clayborne Dana, NP

## 2023-11-06 ENCOUNTER — Telehealth: Payer: Self-pay | Admitting: Neurology

## 2023-11-06 LAB — CERVICOVAGINAL ANCILLARY ONLY
Bacterial Vaginitis (gardnerella): POSITIVE — AB
Candida Glabrata: POSITIVE — AB
Candida Vaginitis: NEGATIVE
Chlamydia: NEGATIVE
Comment: NEGATIVE
Comment: NEGATIVE
Comment: NEGATIVE
Comment: NEGATIVE
Comment: NEGATIVE
Comment: NORMAL
Neisseria Gonorrhea: NEGATIVE
Trichomonas: NEGATIVE

## 2023-11-06 LAB — HSV(HERPES SIMPLEX VRS) I + II AB-IGG
HSV 1 IGG,TYPE SPECIFIC AB: <0.9 {index}
HSV 2 IGG,TYPE SPECIFIC AB: <0.9 {index}

## 2023-11-06 LAB — RPR: RPR Ser Ql: NONREACTIVE

## 2023-11-06 LAB — HIV ANTIBODY (ROUTINE TESTING W REFLEX): HIV 1&2 Ab, 4th Generation: NONREACTIVE

## 2023-11-06 NOTE — Telephone Encounter (Signed)
 Not all results are back. Please advise when receive all results.    Copied from CRM [id number]. Topic: Clinical - Lab/Test Results >> Nov 06, 2023  3:21 PM Isabell A wrote: Reason for CRM: Patient is requesting to speak with her provider in regard to her results and getting medication prescribed.

## 2023-11-07 ENCOUNTER — Other Ambulatory Visit (HOSPITAL_BASED_OUTPATIENT_CLINIC_OR_DEPARTMENT_OTHER): Payer: Self-pay

## 2023-11-07 ENCOUNTER — Encounter: Payer: Self-pay | Admitting: Family Medicine

## 2023-11-07 DIAGNOSIS — B379 Candidiasis, unspecified: Secondary | ICD-10-CM

## 2023-11-07 MED ORDER — BORIC ACID VAGINAL 600 MG VA SUPP: 600.0000 mg | Freq: Every evening | VAGINAL | 0 refills | Status: DC

## 2023-11-07 MED ORDER — BORIC ACID VAGINAL 600 MG VA SUPP
600.0000 mg | Freq: Every evening | VAGINAL | 0 refills | Status: DC
  Filled 2023-11-07: qty 24, 24d supply, fill #0

## 2023-11-07 MED ORDER — METRONIDAZOLE 500 MG PO TABS: 500.0000 mg | ORAL_TABLET | Freq: Two times a day (BID) | ORAL | 0 refills | Status: AC

## 2023-11-07 NOTE — Addendum Note (Signed)
 Addended by: Clayborne Dana on: 11/07/2023 08:21 AM   Modules accepted: Orders

## 2023-11-07 NOTE — Telephone Encounter (Signed)
 Copied from CRM [id number]. Topic: Clinical - Medical Advice >> Nov 07, 2023  1:37 PM Elizabeth Ray wrote: Reason for CRM: Patient used metroNIDAZOLE (FLAGYL) gel last night and wants to know if she should wait until medication finishes before using the Boric acid Suppository. Patient stated that [facility] advised her that she would have to get Boric Acid Vaginal 600 MG from a compounding pharmacy. Please call patient back.

## 2023-11-07 NOTE — Telephone Encounter (Signed)
 Yes, our pharmacy should have them. If not, you can find them OTC sometimes ([facility] makes some I think). Okay to go ahead and start since the metronidazole is oral.

## 2023-11-07 NOTE — Telephone Encounter (Signed)
 Patient made aware of instructions. She will try to find OTC because she is in [place], but sent downstairs in case she can not find it.

## 2023-11-27 ENCOUNTER — Ambulatory Visit (INDEPENDENT_AMBULATORY_CARE_PROVIDER_SITE_OTHER): Payer: Medicaid Other | Admitting: Neurology

## 2023-11-27 ENCOUNTER — Encounter: Payer: Self-pay | Admitting: Neurology

## 2023-11-27 DIAGNOSIS — G40309 Generalized idiopathic epilepsy and epileptic syndromes, not intractable, without status epilepticus: Secondary | ICD-10-CM

## 2023-11-27 MED ORDER — LEVETIRACETAM 500 MG PO TABS: 500.0000 mg | ORAL_TABLET | Freq: Two times a day (BID) | ORAL | 3 refills | Status: DC

## 2023-11-27 NOTE — Progress Notes (Signed)
 [facility]  PATIENT: Elizabeth Ray DOB: 2000-10-31  REQUESTING CLINICIAN: Long, Arlyss Repress, MD HISTORY FROM: Patient  REASON FOR VISIT: Establish care for epilepsy    HISTORICAL  CHIEF COMPLAINT:  Chief Complaint  Patient presents with   New Patient (Initial Visit)    Pt in 12, here alone  Pt is here referred for seizure disorder. Pt states her last seizure was dec/2024. States needs a refill on Keppra.     HISTORY OF PRESENT ILLNESS:  This is a 23 year old woman past medical history of epilepsy who is presenting to establish care.  He tells me that her seizures started in 2021, could not remember her first seizure.  She was put on Keppra but has been nonadherent to the medication.  She tells me that her last seizure was in December 2024 when she fell in the shower and hit the back of her head.  Her seizures are described as generalized convulsion.  She does have tongue biting associated with her seizures. She was restarted on Keppra 500 mg twice daily but only took the medication for 1 month.  Since January, she has not been taking the Keppra and for fortunately for patient, she denies any seizure.  She tells me she had a seizure as an infant, denies any family history of seizures, and  denies any other seizure risk factors.   Handedness: Right handed   Onset: 2021  Seizure Type: Generalized convulsion   Current frequency: Last one was December 2024  Any injuries from seizures: Tongue biting, head trauma   Seizure risk factors: Seizure as a baby  Previous ASMs: Levetiracetam   Currenty ASMs: Levetiracetam 500 mg twice daily   ASMs side effects: Denies   Brain Images: No acute abnormalities   Previous EEGs: Not previously done    OTHER MEDICAL CONDITIONS: Epilepsy   REVIEW OF SYSTEMS: Full 14 system review of systems performed and negative with exception of: As noted in the HPI   ALLERGIES: No Known Allergies  HOME MEDICATIONS: Outpatient  Medications Prior to Visit  Medication Sig Dispense Refill   levETIRAcetam (KEPPRA) 500 MG tablet Take 1 tablet (500 mg total) by mouth 2 (two) times daily. SCHEDULE WITH NEURO FOR REFILLS 60 tablet 0   benzonatate (TESSALON) 100 MG capsule Take 1 capsule (100 mg total) by mouth every 8 (eight) hours. (Patient not taking: Reported on 11/27/2023) 21 capsule 0   Boric Acid Vaginal 600 MG SUPP Place 600 mg vaginally at bedtime. (Patient not taking: Reported on 11/27/2023) 24 suppository 0   ondansetron (ZOFRAN-ODT) 4 MG disintegrating tablet 4mg  ODT q4 hours prn nausea/vomit (Patient not taking: Reported on 11/27/2023) 20 tablet 0   rizatriptan (MAXALT) 5 MG tablet Take 1 tablet (5 mg total) by mouth as needed for migraine. May repeat in 2 hours if needed (Patient not taking: Reported on 11/27/2023) 10 tablet 0   No facility-administered medications prior to visit.    PAST MEDICAL HISTORY: Past Medical History:  Diagnosis Date   ADHD (attention deficit hyperactivity disorder)    Anxiety    Bipolar and related disorder (HCC) 07/15/2015   Depression    Mental disorder    Seizures (HCC)    up until age 58    PAST SURGICAL HISTORY: Past Surgical History:  Procedure Laterality Date   DILATION AND EVACUATION N/A 07/16/2014   Procedure: DILATATION AND EVACUATION;  Surgeon: Tilda Burrow, MD;  Location: [facility];  Service: Gynecology;  Laterality: N/A;  FAMILY HISTORY: Family History  Problem Relation Age of Onset   Diabetes Mother    Diabetes Other    Hypertension Other     SOCIAL HISTORY: Social History   Socioeconomic History   Marital status: Single    Spouse name: Not on file   Number of children: Not on file   Years of education: Not on file   Highest education level: 12th grade  Occupational History   Not on file  Tobacco Use   Smoking status: Former    Current packs/day: 0.50    Types: Cigarettes   Smokeless tobacco: Never   Tobacco comments:    11/27/2023- Pt states she  smokes 2 cigarettes a day   Vaping Use   Vaping status: Never Used  Substance and Sexual Activity   Alcohol use: Not Currently    Comment: occ   Drug use: No   Sexual activity: Yes  Other Topics Concern   Not on file  Social History Narrative   Not on file   Social Drivers of Health   Financial Resource Strain: Patient Declined (09/03/2023)   Overall Financial Resource Strain (CARDIA)    Difficulty of Paying Living Expenses: Patient declined  Food Insecurity: Patient Declined (09/03/2023)   Hunger Vital Sign    Worried About Running Out of Food in the Last Year: Patient declined    Ran Out of Food in the Last Year: Patient declined  Transportation Needs: Patient Declined (09/03/2023)   PRAPARE - Administrator, Civil Service (Medical): Patient declined    Lack of Transportation (Non-Medical): Patient declined  Physical Activity: Unknown (09/03/2023)   Exercise Vital Sign    Days of Exercise per Week: Patient declined    Minutes of Exercise per Session: Not on file  Stress: No Stress Concern Present (09/03/2023)   [employer] of Occupational Health - Occupational Stress Questionnaire    Feeling of Stress : Only a little  Social Connections: Moderately Isolated (09/03/2023)   Social Connection and Isolation Panel [NHANES]    Frequency of Communication with Friends and Family: Once a week    Frequency of Social Gatherings with Friends and Family: Once a week    Attends Religious Services: 1 to 4 times per year    Active Member of [employer] or Organizations: Yes    Attends Banker Meetings: Never    Marital Status: Divorced  Catering manager Violence: Unknown (01/26/2022)   Received from [employer], [facility]   HITS    Physically Hurt: Not on file    Insult or Talk Down To: Not on file    Threaten Physical Harm: Not on file    Scream or Curse: Not on file   PHYSICAL EXAM  GENERAL EXAM/CONSTITUTIONAL: Vitals:  Vitals:   11/27/23 1049   BP: 126/80  Pulse: 94  Weight: 170 lb (77.1 kg)  Height: 5\' 3"  (1.6 m)   Body mass index is 30.11 kg/m. Wt Readings from Last 3 Encounters:  11/27/23 170 lb (77.1 kg)  11/05/23 165 lb (74.8 kg)  10/30/23 163 lb (73.9 kg)   Patient is in no distress; well developed, nourished and groomed; neck is supple  MUSCULOSKELETAL: Gait, strength, tone, movements noted in Neurologic exam below  NEUROLOGIC: MENTAL STATUS:      No data to display         awake, alert, oriented to person, place and time recent and remote memory intact normal attention and concentration language fluent, comprehension intact, naming intact  fund of knowledge appropriate  CRANIAL NERVE:  2nd, 3rd, 4th, 6th - Visual fields full to confrontation, extraocular muscles intact, no nystagmus 5th - facial sensation symmetric 7th - facial strength symmetric 8th - hearing intact 9th - palate elevates symmetrically, uvula midline 11th - shoulder shrug symmetric 12th - tongue protrusion midline  MOTOR:  normal bulk and tone, full strength in the BUE, BLE  SENSORY:  normal and symmetric to light touch  COORDINATION:  finger-nose-finger, fine finger movements normal  GAIT/STATION:  normal   DIAGNOSTIC DATA (LABS, IMAGING, TESTING) - I reviewed patient records, labs, notes, testing and imaging myself where available.  Lab Results  Component Value Date   WBC 5.6 10/30/2023   HGB 13.0 10/30/2023   HCT 38.9 10/30/2023   MCV 87.2 10/30/2023   PLT 240 10/30/2023      Component Value Date/Time   NA 139 11/05/2023 0900   K 4.3 11/05/2023 0900   CL 102 11/05/2023 0900   CO2 28 11/05/2023 0900   GLUCOSE 83 11/05/2023 0900   BUN 6 11/05/2023 0900   CREATININE 0.65 11/05/2023 0900   CREATININE 0.82 09/27/2022 1545   CALCIUM 9.1 11/05/2023 0900   PROT 7.2 10/30/2023 1939   ALBUMIN 3.9 10/30/2023 1939   AST 27 10/30/2023 1939   ALT 26 10/30/2023 1939   ALKPHOS 87 10/30/2023 1939   BILITOT 0.6  10/30/2023 1939   GFRNONAA >60 10/30/2023 1939   GFRNONAA 105 08/15/2019 1143   GFRAA 122 08/15/2019 1143   Lab Results  Component Value Date   CHOL 143 12/05/2021   HDL 59.30 12/05/2021   LDLCALC 78 12/05/2021   TRIG 31.0 12/05/2021   Lab Results  Component Value Date   HGBA1C 5.5 10/01/2019   Lab Results  Component Value Date   VITAMINB12 251 01/24/2023   Lab Results  Component Value Date   TSH 1.73 01/24/2023    Head CT 09/07/2023 1. Negative CT of the brain. No acute or focal lesion to explain the patient's symptoms.   ASSESSMENT AND PLAN  23 y.o. year old female  with history of epilepsy who is presenting to establish care.  She was previously on Keppra 500 mg twice daily, denies any side effect from the medication and denies any adverse reaction.  Plan will be to restart Keppra 500 mg twice daily.  I will also obtain routine EEG, if any major abnormality, will consider MRI brain.  Her most recent head CT was negative for any acute abnormality.  I will see her in 6 months for follow-up but she understands to contact me if she does have a breakthrough seizure.   1. Generalized idiopathic epilepsy and epileptic syndromes, not intractable, without status epilepticus (HCC)     Patient Instructions  Restart Keppra 500 mg twice daily Routine EEG Advised patient to start prenatal vitamins Please contact me if you do have another seizure Follow-up in 6 months or sooner if worse   Per [facility] statutes, patients with seizures are not allowed to drive until they have been seizure-free for six months.  Other recommendations include using caution when using heavy equipment or power tools. Avoid working on ladders or at heights. Take showers instead of baths.  Do not swim alone.  Ensure the water temperature is not too high on the home water heater. Do not go swimming alone. Do not lock yourself in a room alone (i.e. bathroom). When caring for infants or small children,  sit down when holding, feeding, or  changing them to minimize risk of injury to the child in the event you have a seizure. Maintain good sleep hygiene. Avoid alcohol.  Also recommend adequate sleep, hydration, good diet and minimize stress.   During the Seizure  - First, ensure adequate ventilation and place patients on the floor on their left side  Loosen clothing around the neck and ensure the airway is patent. If the patient is clenching the teeth, do not force the mouth open with any object as this can cause severe damage - Remove all items from the surrounding that can be hazardous. The patient may be oblivious to what's happening and may not even know what he or she is doing. If the patient is confused and wandering, either gently guide him/her away and block access to outside areas - Reassure the individual and be comforting - Call 911. In most cases, the seizure ends before EMS arrives. However, there are cases when seizures may last over 3 to 5 minutes. Or the individual may have developed breathing difficulties or severe injuries. If a pregnant patient or a person with diabetes develops a seizure, it is prudent to call an ambulance. - Finally, if the patient does not regain full consciousness, then call EMS. Most patients will remain confused for about 45 to 90 minutes after a seizure, so you must use judgment in calling for help. - Avoid restraints but make sure the patient is in a bed with padded side rails - Place the individual in a lateral position with the neck slightly flexed; this will help the saliva drain from the mouth and prevent the tongue from falling backward - Remove all nearby furniture and other hazards from the area - Provide verbal assurance as the individual is regaining consciousness - Provide the patient with privacy if possible - Call for help and start treatment as ordered by the caregiver   After the Seizure (Postictal Stage)  After a seizure, most patients  experience confusion, fatigue, muscle pain and/or a headache. Thus, one should permit the individual to sleep. For the next few days, reassurance is essential. Being calm and helping reorient the person is also of importance.  Most seizures are painless and end spontaneously. Seizures are not harmful to others but can lead to complications such as stress on the lungs, brain and the heart. Individuals with prior lung problems may develop labored breathing and respiratory distress.    Discussed Patients with epilepsy have a small risk of sudden unexpected death, a condition referred to as sudden unexpected death in epilepsy (SUDEP). SUDEP is defined specifically as the sudden, unexpected, witnessed or unwitnessed, nontraumatic and nondrowning death in patients with epilepsy with or without evidence for a seizure, and excluding documented status epilepticus, in which post mortem examination does not reveal a structural or toxicologic cause for death     Orders Placed This Encounter  Procedures   EEG adult    Meds ordered this encounter  Medications   levETIRAcetam (KEPPRA) 500 MG tablet    Sig: Take 1 tablet (500 mg total) by mouth 2 (two) times daily.    Dispense:  180 tablet    Refill:  3    Return in about 6 months (around 05/29/2024).    Windell Norfolk, MD 11/27/2023, 12:53 PM  [facility] [address] [place], [place] [address] [phone]

## 2023-11-27 NOTE — Patient Instructions (Signed)
 Restart Keppra 500 mg twice daily Routine EEG Advised patient to start prenatal vitamins Please contact me if you do have another seizure Follow-up in 6 months or sooner if worse

## 2023-11-29 ENCOUNTER — Encounter: Payer: Self-pay | Admitting: *Deleted

## 2023-11-29 ENCOUNTER — Ambulatory Visit: Payer: Medicaid Other | Admitting: Family Medicine

## 2023-11-29 ENCOUNTER — Other Ambulatory Visit (HOSPITAL_COMMUNITY)
Admission: RE | Admit: 2023-11-29 | Discharge: 2023-11-29 | Disposition: A | Discharge status: Home / Self Care | Source: Ambulatory Visit | Attending: Family Medicine | Admitting: Family Medicine

## 2023-11-29 VITALS — BP 114/70 | HR 81 | Wt 166.0 lb

## 2023-11-29 DIAGNOSIS — Z202 Contact with and (suspected) exposure to infections with a predominantly sexual mode of transmission: Secondary | ICD-10-CM

## 2023-11-29 DIAGNOSIS — F419 Anxiety disorder, unspecified: Secondary | ICD-10-CM | POA: Diagnosis not present

## 2023-11-29 DIAGNOSIS — Z124 Encounter for screening for malignant neoplasm of cervix: Secondary | ICD-10-CM

## 2023-11-29 DIAGNOSIS — N920 Excessive and frequent menstruation with regular cycle: Secondary | ICD-10-CM
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
## 2023-11-30 ENCOUNTER — Encounter: Payer: Self-pay | Admitting: Family Medicine

## 2023-11-30 LAB — CERVICOVAGINAL ANCILLARY ONLY
Bacterial Vaginitis (gardnerella): NEGATIVE
Candida Glabrata: NEGATIVE
Candida Vaginitis: NEGATIVE
Chlamydia: NEGATIVE
Comment: NEGATIVE
Comment: NEGATIVE
Comment: NEGATIVE
Comment: NEGATIVE
Comment: NEGATIVE
Comment: NORMAL
Neisseria Gonorrhea: NEGATIVE
Trichomonas: NEGATIVE

## 2023-11-30 LAB — CYTOLOGY - PAP: Diagnosis: NEGATIVE

## 2023-12-04 ENCOUNTER — Encounter: Payer: Self-pay | Admitting: Neurology

## 2023-12-04 ENCOUNTER — Ambulatory Visit: Admitting: Neurology

## 2023-12-04 DIAGNOSIS — G40309 Generalized idiopathic epilepsy and epileptic syndromes, not intractable, without status epilepticus: Secondary | ICD-10-CM | POA: Diagnosis not present

## 2023-12-04 NOTE — Procedures (Signed)
   History:  23 year old woman with seizure disorder   EEG classification:  Awake and asleep  Duration:  27 minutes   Technical aspects: This EEG study was done with scalp electrodes positioned according to the 10-20 International system of electrode placement. Electrical activity was reviewed with band pass filter of 1-70Hz , sensitivity of 7 uV/mm, display speed of 57mm/sec with a 60Hz  notched filter applied as appropriate. EEG data were recorded continuously and digitally stored.   Description of the recording: The background rhythms of this recording consists of a fairly well modulated medium amplitude background activity of 11 Hz. As the record progresses, the patient initially is in the waking state, but appears to enter the early stage II sleep during the recording, with rudimentary sleep spindles and vertex sharp wave activity seen. During the wakeful state, photic stimulation was performed, and no abnormal responses were seen. Hyperventilation was also performed, no abnormal response seen. No epileptiform discharges seen during this recording. There was intermittent left frontal focal slowing.   Abnormality: Intermittent left frontal slowing  Impression: This is an abnormal awake and sleep EEG due to presence of intermittent left frontal slowing. This is consistent with area of neuronal dysfunction in the left frontal region.     Windell Norfolk, MD [facility]

## 2023-12-05 ENCOUNTER — Encounter: Payer: Self-pay | Admitting: Neurology

## 2023-12-05 ENCOUNTER — Other Ambulatory Visit: Payer: Self-pay | Admitting: Neurology

## 2023-12-05 ENCOUNTER — Telehealth: Payer: Self-pay

## 2023-12-05 DIAGNOSIS — G40309 Generalized idiopathic epilepsy and epileptic syndromes, not intractable, without status epilepticus: Secondary | ICD-10-CM

## 2023-12-05 NOTE — Telephone Encounter (Signed)
 Patient called and reviewed EEG results and next steps and reason for MRI. Reviewed seizure triggers and patient does report medication compliance at this time. Advised to call back by Monday if not have been contacted about MRI.
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
## 2023-12-06 ENCOUNTER — Telehealth: Payer: Self-pay | Admitting: Neurology

## 2023-12-06 NOTE — Telephone Encounter (Signed)
 no auth required sent to [facility] [phone]

## 2023-12-08 ENCOUNTER — Ambulatory Visit (HOSPITAL_BASED_OUTPATIENT_CLINIC_OR_DEPARTMENT_OTHER)
Admission: RE | Admit: 2023-12-08 | Discharge: 2023-12-08 | Disposition: A | Discharge status: Home / Self Care | Source: Ambulatory Visit | Attending: Family Medicine | Admitting: Family Medicine

## 2023-12-08 DIAGNOSIS — N920 Excessive and frequent menstruation with regular cycle: Secondary | ICD-10-CM | POA: Diagnosis not present

## 2023-12-10 ENCOUNTER — Encounter: Payer: Self-pay | Admitting: Family Medicine

## 2023-12-13 ENCOUNTER — Encounter (HOSPITAL_BASED_OUTPATIENT_CLINIC_OR_DEPARTMENT_OTHER): Payer: Self-pay

## 2023-12-13 ENCOUNTER — Emergency Department (HOSPITAL_BASED_OUTPATIENT_CLINIC_OR_DEPARTMENT_OTHER)
Admission: EM | Admit: 2023-12-13 | Discharge: 2023-12-13 | Disposition: A | Discharge status: Home / Self Care | Attending: Emergency Medicine | Admitting: Emergency Medicine

## 2023-12-13 ENCOUNTER — Other Ambulatory Visit: Payer: Self-pay

## 2023-12-13 DIAGNOSIS — S71101A Unspecified open wound, right thigh, initial encounter: Secondary | ICD-10-CM | POA: Insufficient documentation

## 2023-12-13 DIAGNOSIS — Y93E8 Activity, other personal hygiene: Secondary | ICD-10-CM | POA: Insufficient documentation

## 2023-12-13 DIAGNOSIS — S80811A Abrasion, right lower leg, initial encounter: Secondary | ICD-10-CM | POA: Diagnosis not present

## 2023-12-13 DIAGNOSIS — W268XXA Contact with other sharp object(s), not elsewhere classified, initial encounter: Secondary | ICD-10-CM | POA: Diagnosis not present

## 2023-12-13 DIAGNOSIS — S79921A Unspecified injury of right thigh, initial encounter: Secondary | ICD-10-CM | POA: Diagnosis present

## 2023-12-13 DIAGNOSIS — T148XXA Other injury of unspecified body region, initial encounter: Secondary | ICD-10-CM

## 2023-12-13 MED ORDER — DOXYCYCLINE HYCLATE 100 MG PO CAPS: 100.0000 mg | ORAL_CAPSULE | Freq: Two times a day (BID) | ORAL | 0 refills | Status: DC

## 2023-12-13 MED ORDER — DOXYCYCLINE HYCLATE 100 MG PO TABS
100.0000 mg | ORAL_TABLET | Freq: Once | ORAL | Status: AC
  Administered 2023-12-13: 100 mg via ORAL
  Filled 2023-12-13: qty 1

## 2023-12-13 NOTE — ED Triage Notes (Signed)
 Was shaving her groin and had a small rash develop. Says it was itching and when she scratched she noticed some bleeding.

## 2023-12-13 NOTE — ED Provider Notes (Signed)
 [place] EMERGENCY DEPARTMENT AT [facility] Provider Note   CSN: [id number] Arrival date & time: 12/13/23  2213     History  Chief Complaint  Patient presents with   Rash    Elizabeth Ray is a 23 y.o. female.  Patient here with abrasion wound to the right thigh she thinks he might of cut herself shaving last week.  She has been having some irritation and itchiness in the right thigh.  Nothing makes it worse or better.  Denies any fever or chills.  Denies any shortness of breath or hives or major swelling.  The history is provided by the patient.       Home Medications Prior to Admission medications   Medication Sig Start Date End Date Taking? Authorizing Provider  doxycycline (VIBRAMYCIN) 100 MG capsule Take 1 capsule (100 mg total) by mouth 2 (two) times daily. 12/13/23  Yes Kadence Mimbs, DO  benzonatate (TESSALON) 100 MG capsule Take 1 capsule (100 mg total) by mouth every 8 (eight) hours. Patient not taking: Reported on 11/29/2023 10/30/23   Melene Plan, DO  Boric Acid Vaginal 600 MG SUPP Place 600 mg vaginally at bedtime. Patient not taking: Reported on 11/29/2023 11/07/23   Hyman Hopes B, NP  levETIRAcetam (KEPPRA) 500 MG tablet Take 1 tablet (500 mg total) by mouth 2 (two) times daily. 11/27/23 11/21/24  Windell Norfolk, MD  ondansetron (ZOFRAN-ODT) 4 MG disintegrating tablet 4mg  ODT q4 hours prn nausea/vomit Patient not taking: Reported on 11/29/2023 10/30/23   Melene Plan, DO  rizatriptan (MAXALT) 5 MG tablet Take 1 tablet (5 mg total) by mouth as needed for migraine. May repeat in 2 hours if needed Patient not taking: Reported on 11/29/2023 01/24/23   Clayborne Dana, NP      Allergies    Patient has no known allergies.    Review of Systems   Review of Systems  Physical Exam Updated Vital Signs BP 128/78 (BP Location: Right Arm)   Pulse 89   Temp 97.6 F (36.4 C)   Resp 19   LMP 11/13/2023 (Exact Date)   SpO2 100%  Physical Exam Vitals and nursing note  reviewed. Exam conducted with a chaperone present.  Constitutional:      General: She is not in acute distress.    Appearance: She is well-developed.  HENT:     Head: Normocephalic and atraumatic.  Eyes:     Conjunctiva/sclera: Conjunctivae normal.  Cardiovascular:     Rate and Rhythm: Normal rate and regular rhythm.     Heart sounds: No murmur heard. Pulmonary:     Effort: Pulmonary effort is normal. No respiratory distress.     Breath sounds: Normal breath sounds.  Abdominal:     Palpations: Abdomen is soft.     Tenderness: There is no abdominal tenderness.  Musculoskeletal:        General: No swelling.     Cervical back: Neck supple.  Skin:    General: Skin is warm and dry.     Capillary Refill: Capillary refill takes less than 2 seconds.     Comments: Small area of abrasion/wound to the right thigh no major purulence abscess or drainage  Neurological:     Mental Status: She is alert.  Psychiatric:        Mood and Affect: Mood normal.     ED Results / Procedures / Treatments   Labs (all labs ordered are listed, but only abnormal results are displayed) Labs Reviewed - No data  to display  EKG None  Radiology No results found.  Procedures Procedures    Medications Ordered in ED Medications  doxycycline (VIBRA-TABS) tablet 100 mg (has no administration in time range)    ED Course/ Medical Decision Making/ A&P                                 Medical Decision Making Risk Prescription drug management.   Elizabeth Ray is here with wound to her right thigh.  She cut herself shaving last week and now having some itchiness and irritation in the right groin area.  Which chaperone I looked at this area and it appears that she has a small abrasion/small healing wound to the right groin.  There is no abscess or fluctuance or purulent drainage.  Recommend keeping this area clean and dry soap and water.  Recommend Neosporin or bacitracin ointment twice daily.  Will be  conservative and do antibiotics to treat any mild cellulitis.  Overall given reassurance.  Discharged in good condition.  Understands return precautions.  This chart was dictated using voice recognition software.  Despite best efforts to proofread,  errors can occur which can change the documentation meaning.         Final Clinical Impression(s) / ED Diagnoses Final diagnoses:  Abrasion    Rx / DC Orders ED Discharge Orders          Ordered    doxycycline (VIBRAMYCIN) 100 MG capsule  2 times daily        12/13/23 2244              Virgina Norfolk, DO 12/13/23 2246

## 2023-12-13 NOTE — Discharge Instructions (Signed)
 Looks like you have a small abrasion in your right thigh.  Recommend Neosporin ointment twice a day.  I have prescribed you antibiotic to be conservative to make sure no infection develops.  Follow-up with your primary care doctor.  Return if symptoms worsen.

## 2023-12-19 ENCOUNTER — Ambulatory Visit: Admitting: Clinical

## 2023-12-19 DIAGNOSIS — F4310 Post-traumatic stress disorder, unspecified: Secondary | ICD-10-CM

## 2023-12-19 DIAGNOSIS — F4321 Adjustment disorder with depressed mood: Secondary | ICD-10-CM

## 2023-12-19 NOTE — Patient Instructions (Signed)
 Center for [facility] at [facility] [address] [place], [place] [address] [phone] (main office) [phone] [facility] office)  Transportation Resources [facility] [employer] (GTA) [address] [place] [place], [place], [place] [address] https://www.[place].gov/departments/transportation/gdot-divisions/[place]-transit-agency-public-transportation-division     Fixed-route bus services, including regional fare cards for PART, [place], [place], and WSTA buses.  Reduced fare bus ID's available for Medicaid, Medicare, and "orange card" recipients.  SCAT offers curb-to-curb and door-to-door bus services for people with disabilities who are unable to use a fixed-bus route; also offers a shared-ride program.   Helpful tips:  -Routes available online and physical maps available at the main bus hub lobby (each for a specific route) -Smartphone directions often include bus routes (see the "bus" icon, next to the "car" and "walk" icons) -Routes differ on weekends, evenings and holidays, so plan ahead!  -If you have Medicaid, Medicare, or orange card, plan to obtain a reduced-fare ID to save 50% on rides. Check days and times to obtain an ID, and bring all necessary documents.   Wheels [address] [place], [place] [address] [phone] www.wheels4hope.org **REFERRAL NEEDED by specific agencies (see website), after meeting specified criteria only  Housing Resources                    [employer] (serves [place], [place], [place], [place], [place], [place], [place], [place], [place], [place], [place], [place], and [place] counties) [address] [place], [place] [address] [phone] [employer].ch  **Rental assistance, Home Rehabilitation,Weatherization Assistance Program, Chief Financial Officer, Housing Voucher Program  [employer]  for Housing and [employer] Studies: Proofreader Resources to residents of [place], [place], and [place] Make sure you have your documents ready, including:  (Household income verification: 2 months pay stubs, unemployment/social security award letter, statement of no income for all household members over 86) Photo ID for all household members over 18 Utility Bill/Rent Ledger/Lease: must show past due amount for utilities/rent, or the rental agreement if rent is current 2. Start your application online or by paper (in [place] or [place]) at:     https://www.castaneda.biz/  3. Once you have completed the online application, you will get an email confirmation message from the county. Expect to hear back by phone or email at least 6-10 weeks from submitting your application.  4. While you wait:  Call [phone] to check in on your application Let your landlord know that you've applied. Your landlord will be asked to submit documents (W-9) during this application process. Payments will be made directly to the landlord/property management company and utility company Rent or utility assistance for [employer], [place], and [place] Apply at https://rb.gy/dvxbfv Questions? Call or email Renee at [phone] or [email or web address]   Eviction Mediation Program: The HOPE Program Https://www.rebuild.[email or web address].cy HOPE [employer] serves low-income renters in [address] [place] counties, defined as less than or equal to 80% of the area median income for the county where the renter lives. In the following 12 counties, you should apply to your local rent and utility assistance program INSTEAD OF the HOPE Program: [place], [place], [place], [place], [place], [place], [place], [place], [place], [place], [place], [place]  If you live outside of [place], contact HOPE call center at [phone] to talk to a Program  Representative Monday-Friday, 8am-5pm Note that Native American tribes also received federal funding for rent and utility assistance programs. Recognized members of the following tribes will be served  by programs managed by tribal governments, including: [place] of [address], [place], [place], [place] of [place] and [facility] Eviction Mediation Coordinator, [place], [phone] [email or web address] .[employer], [employer], [phone] [email or web address]   Housing Resources [facility] [address] [place], [place] [address] [phone] PhoneCaptions.ch **Programs include: Foreclosure Prevention and Housing Counseling, Healthy Homes/Tenant Advocacy, Homeless Prevention and Housing Assistance  [employer] [address], [place], [place] [address] [phone] https://chshousing.org **Home Ownership/Affordable Housing Program and [facility]  [employer] [address], [place], [place] [address] [phone] PaidValue.com.cy **home buyer education courses, foreclosure prevention

## 2023-12-25 NOTE — BH Specialist Note (Signed)
 Integrated Behavioral Health via Telemedicine Visit  01/02/2024 LOUAN BASE [id number]  Number of Integrated Behavioral Health Clinician visits: 2- Second Visit  Session Start time: 1046   Session End time: 1116  Total time in minutes: 30   Referring Provider: Werner Hamlet, DO Patient/Family location: Home [facility] Provider location: [facility]  All persons participating in visit: Patient Elizabeth Ray and [facility] Maximilien Hayashi   Types of Service: Individual psychotherapy and Video visit  I connected with Yalda R Brandes and/or Sanaa R Fairfax's  n/a  via  Telephone or Video Enabled Telemedicine Application  (Video is Caregility application) and verified that I am speaking with the correct person using two identifiers. Discussed confidentiality: Yes   I discussed the limitations of telemedicine and the availability of in person appointments.  Discussed there is a possibility of technology failure and discussed alternative modes of communication if that failure occurs.  I discussed that engaging in this telemedicine visit, they consent to the provision of behavioral healthcare and the services will be billed under their insurance.  Patient and/or legal guardian expressed understanding and consented to Telemedicine visit: Yes   Presenting Concerns: Patient and/or family reports the following symptoms/concerns: Recent low energy, low motivation, overwhelmed, exhausted (worked 129 hours last week, while living onsite)"feel foggy in my mind"; pt took pregnancy test with a faint positive, then another negative days later, possibly from miscarriage she experienced in February; continued mourning the loss of her grandfather and marriage. Pt feeling hopeful about moving into her own place soon, with 20 minute scenic walk to work.  Duration of problem: Ongoing anxiety with recent increased depressed symptoms; Severity of problem:  moderate  Patient and/or Family's Strengths/Protective Factors: Concrete supports in place (healthy food, safe environments, etc.), Sense of purpose, and Physical Health (exercise, healthy diet, medication compliance, etc.)  Goals Addressed: Patient will:  Reduce symptoms of: anxiety, depression, and stress   Increase knowledge and/or ability of: stress reduction   Demonstrate ability to: Increase healthy adjustment to current life circumstances, Increase motivation to adhere to plan of care, and Continue healthy grieving over loss  Progress towards Goals: Ongoing  Interventions: Interventions utilized:  Supportive Reflection Standardized Assessments completed: Not Needed  Patient and/or Family Response: Patient agrees with treatment plan.   Assessment: Patient currently experiencing Grief; History of PTSD.   Patient may benefit from continued therapeutic intervention  .  Plan: Follow up with behavioral health clinician on : One month Behavioral recommendations:  -Immediately after this visit, take 10 minute nap before resuming work for the day -Continue with plan to take this coming Friday off work, to move into new apartment -Once moved in, consider obtaining umbrella and rain boots for the walk to/from work as needed -Continue to allow time and space to grieve losses; continue self-compassion and kindness every day -Continue using modified Worry Time strategy as needed daily Referral(s): Integrated [employer] (In Clinic)  I discussed the assessment and treatment plan with the patient and/or parent/guardian. They were provided an opportunity to ask questions and all were answered. They agreed with the plan and demonstrated an understanding of the instructions.   They were advised to call back or seek an in-person evaluation if the symptoms worsen or if the condition fails to improve as anticipated.  Elfredia Grippe Margaretha Mahan, LCSW     12/19/2023    9:54 AM  01/24/2023    2:03 PM 12/05/2021   12:12 PM 10/07/2021  8:42 AM 08/18/2018   10:44 AM  Depression screen PHQ 2/9  Decreased Interest 1 1 0 0 2  Down, Depressed, Hopeless 1 0 0 0 2  PHQ - 2 Score 2 1 0 0 4  Altered sleeping 3 0   1  Tired, decreased energy 3 2   2   Change in appetite 3 3   3   Feeling bad or failure about yourself  3 2   2   Trouble concentrating 3 2   2   Moving slowly or fidgety/restless 1 0   1  Suicidal thoughts 0 0     PHQ-9 Score 18 10   15   Difficult doing work/chores  Somewhat difficult         12/19/2023    9:57 AM 01/24/2023    2:03 PM 08/18/2018   10:44 AM 10/31/2017    5:03 PM  GAD 7 : Generalized Anxiety Score  Nervous, Anxious, on Edge 1 1 2  0  Control/stop worrying 3 1 2 3   Worry too much - different things 3 1 2 3   Trouble relaxing 3 2 2 3   Restless 3 1 2  0  Easily annoyed or irritable 0 3 3 1   Afraid - awful might happen 1 1 2  0  Total GAD 7 Score 14 10 15 10   Anxiety Difficulty  Somewhat difficult

## 2024-01-02 ENCOUNTER — Ambulatory Visit: Admitting: Clinical

## 2024-01-02 DIAGNOSIS — F4321 Adjustment disorder with depressed mood: Secondary | ICD-10-CM | POA: Diagnosis not present

## 2024-01-02 DIAGNOSIS — F4310 Post-traumatic stress disorder, unspecified: Secondary | ICD-10-CM

## 2024-01-02 NOTE — Patient Instructions (Signed)
 Center for [facility] Healthcare at [facility] for Women [address] [place], [place] [address] [phone] (main office) [phone] (Konner Saiz's office)

## 2024-01-10 ENCOUNTER — Emergency Department (HOSPITAL_BASED_OUTPATIENT_CLINIC_OR_DEPARTMENT_OTHER): Admitting: Radiology

## 2024-01-10 ENCOUNTER — Emergency Department (HOSPITAL_BASED_OUTPATIENT_CLINIC_OR_DEPARTMENT_OTHER)
Admission: EM | Admit: 2024-01-10 | Discharge: 2024-01-10 | Disposition: A | Discharge status: Home / Self Care | Attending: Emergency Medicine | Admitting: Emergency Medicine

## 2024-01-10 ENCOUNTER — Encounter (HOSPITAL_BASED_OUTPATIENT_CLINIC_OR_DEPARTMENT_OTHER): Payer: Self-pay | Admitting: Emergency Medicine

## 2024-01-10 ENCOUNTER — Other Ambulatory Visit: Payer: Self-pay

## 2024-01-10 DIAGNOSIS — Z23 Encounter for immunization: Secondary | ICD-10-CM | POA: Diagnosis not present

## 2024-01-10 DIAGNOSIS — W450XXA Nail entering through skin, initial encounter: Secondary | ICD-10-CM | POA: Diagnosis not present

## 2024-01-10 DIAGNOSIS — S91332A Puncture wound without foreign body, left foot, initial encounter: Secondary | ICD-10-CM | POA: Diagnosis not present

## 2024-01-10 DIAGNOSIS — S99922A Unspecified injury of left foot, initial encounter: Secondary | ICD-10-CM | POA: Diagnosis present

## 2024-01-10 LAB — PREGNANCY, URINE: Preg Test, Ur: NEGATIVE

## 2024-01-10 MED ORDER — TETANUS-DIPHTH-ACELL PERTUSSIS 5-2.5-18.5 LF-MCG/0.5 IM SUSY
0.5000 mL | PREFILLED_SYRINGE | Freq: Once | INTRAMUSCULAR | Status: AC
  Administered 2024-01-10: 0.5 mL via INTRAMUSCULAR
  Filled 2024-01-10: qty 0.5

## 2024-01-10 MED ORDER — CEFADROXIL 500 MG PO CAPS: 500.0000 mg | ORAL_CAPSULE | Freq: Two times a day (BID) | ORAL | 0 refills | Status: DC

## 2024-01-10 NOTE — ED Notes (Signed)
 Discharge paperwork given and verbally understood.

## 2024-01-10 NOTE — ED Triage Notes (Signed)
 Stepped on rusty nail while at work. Puncture wound on left foot. Unknown tetanus.

## 2024-01-10 NOTE — ED Notes (Signed)
 Pt given swabs and instruction on how to swab.... Pt understood.Elizabeth AasAaron Ray

## 2024-01-10 NOTE — Discharge Instructions (Addendum)
 As discussed visit to the emergency department today was overall reassuring.  X-ray did not show obvious foreign body.  Follow MyChart for the results of your wet prep/GC/chlamydia.  Will place you on antibiotics for prevention of infection of your foot.  Recommend washing your foot gently with warm soapy water and placing a bandage over your foot until area heals over the next 5 to 7 days.  Recommend follow-up with your primary care for reassessment of your symptoms.  Please do not hesitate to return if the worrisome signs and symptoms we discussed become apparent.

## 2024-01-10 NOTE — ED Provider Notes (Signed)
 [place] EMERGENCY DEPARTMENT AT [facility] Provider Note   CSN: [id number] Arrival date & time: 01/10/24  1502     History  Chief Complaint  Patient presents with   Foot Injury    Elizabeth Ray is a 23 y.o. female.  HPI   23 year old female presents emergency department with complaints of foot pain.  Patient reports stepping on a nail while at work.  States that she was wearing crocs.  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  Patient unaware of how far the nail went in but did pull out of her shoe.  Unaware of last tetanus.  Denies any pain/trauma elsewhere.  Past medical history significant for ADHD, seizure, bipolar disorder, migraine, MDD  Home Medications Prior to Admission medications   Medication Sig Start Date End Date Taking? Authorizing Provider  cefadroxil  (DURICEF) 500 MG capsule Take 1 capsule (500 mg total) by mouth 2 (two) times daily. 01/10/24  Yes Neil Balls A, PA  benzonatate  (TESSALON ) 100 MG capsule Take 1 capsule (100 mg total) by mouth every 8 (eight) hours. Patient not taking: Reported on 11/29/2023 10/30/23   Albertus Hughs, DO  Boric Acid Vaginal 600 MG SUPP Place 600 mg vaginally at bedtime. Patient not taking: Reported on 11/29/2023 11/07/23   Everlina Hock, NP  doxycycline  (VIBRAMYCIN ) 100 MG capsule Take 1 capsule (100 mg total) by mouth 2 (two) times daily. 12/13/23   Curatolo, Adam, DO  levETIRAcetam  (KEPPRA ) 500 MG tablet Take 1 tablet (500 mg total) by mouth 2 (two) times daily. 11/27/23 11/21/24  Pohle, Amadou, MD  ondansetron  (ZOFRAN -ODT) 4 MG disintegrating tablet 4mg  ODT q4 hours prn nausea/vomit Patient not taking: Reported on 11/29/2023 10/30/23   Albertus Hughs, DO  rizatriptan  (MAXALT ) 5 MG tablet Take 1 tablet (5 mg total) by mouth as needed for migraine. May repeat in 2 hours if needed Patient not taking: Reported on 11/29/2023 01/24/23   Everlina Hock, NP      Allergies    Patient has no known allergies.    Review of Systems   Review of Systems  All other systems reviewed and are negative.   Physical Exam Updated Vital Signs BP 106/64 (BP Location: Left Arm)   Pulse 81   Temp 97.8 F (36.6 C) (Oral)   Resp 16   SpO2 100%  Physical Exam Vitals and nursing note reviewed.  Constitutional:      General: She is not in acute distress.    Appearance: She is well-developed.  HENT:     Head: Normocephalic and atraumatic.  Eyes:     Conjunctiva/sclera: Conjunctivae normal.  Cardiovascular:     Rate and Rhythm: Normal rate and regular rhythm.     Heart sounds: No murmur heard. Pulmonary:     Effort: Pulmonary effort is normal. No respiratory distress.     Breath sounds: Normal breath sounds.  Abdominal:     Palpations: Abdomen is soft.     Tenderness: There is no abdominal tenderness.  Musculoskeletal:        General: No swelling.     Cervical back: Neck supple.       Feet:     Comments: Punctate wound in area indicated above.  Overlying tenderness.  No appreciable foreign body.  No surrounding erythema, palpable fluctuance/induration.  Full range of motion left ankle, digits.  Pedal and posttibial pulses 2+ bilaterally.  Skin:  General: Skin is warm and dry.     Capillary Refill: Capillary refill takes less than 2 seconds.  Neurological:     Mental Status: She is alert.  Psychiatric:        Mood and Affect: Mood normal.     ED Results / Procedures / Treatments   Labs (all labs ordered are listed, but only abnormal results are displayed) Labs Reviewed  WET PREP, GENITAL  PREGNANCY, URINE  GC/CHLAMYDIA PROBE AMP ([place]) NOT AT [facility]    EKG None  Radiology DG Foot Complete Left Result Date: 01/10/2024 CLINICAL DATA:  Left foot puncture wound. EXAM: LEFT FOOT - COMPLETE 3+ VIEW COMPARISON:  February 21, 2022. FINDINGS: There is no evidence of fracture or dislocation. There is no evidence of arthropathy or  other focal bone abnormality. Soft tissues are unremarkable. IMPRESSION: Negative. Electronically Signed   By: Rosalene Colon M.D.   On: 01/10/2024 16:16    Procedures Procedures    Medications Ordered in ED Medications  Tdap (BOOSTRIX ) injection 0.5 mL (0.5 mLs Intramuscular Given 01/10/24 1611)    ED Course/ Medical Decision Making/ A&P Clinical Course as of 01/10/24 1706  Thu Jan 10, 2024  1705 When discussing discharge, patient described vaginal discharge is malodorous has been present for the last couple of days.  Says she has a history of BV and this feels the same.  Requesting vaginal swabs.  States that she does not want to stay for results but will follow on MyChart and follow-up outpatient if she test positive.  This deemed reasonable.  Will plan for discharge. [CR]    Clinical Course User Index [CR] Milford Butter, PA                                 Medical Decision Making  This patient presents to the ED for concern of foot pain, this involves an extensive number of treatment options, and is a complaint that carries with it a high risk of complications and morbidity.  The differential diagnosis includes fracture, strain/sprain, dislocation, ligamentous/tendon injury, neurovascular compromise, gout, septic arthritis, cellulitis, erysipelas, necrotizing infection, DVT, ischemic limb, other   Co morbidities that complicate the patient evaluation  See HPI   Additional history obtained:  Additional history obtained from EMR External records from outside source obtained and reviewed including hospital records   Lab Tests:  Urine pregnancy emergency negative   Imaging Studies ordered:  I ordered imaging studies including left foot x-ray I independently visualized and interpreted imaging which showed no acute abnormality I agree with the radiologist interpretation   Cardiac Monitoring: / EKG:  N/a   Consultations Obtained:  N/a   Problem List / ED  Course / Critical interventions / Medication management  Puncture wound left foot I ordered medication including Tdap   Reevaluation of the patient after these medicines showed that the patient stayed the same I have reviewed the patients home medicines and have made adjustments as needed   Social Determinants of Health:  Former cigarette use.  Denies illicit drug use.   Test / Admission - Considered:  Puncture wound left foot Vitals signs within normal range and stable throughout visit. Imaging studies significant for: see above 23 year old female presents emergency department with complaints of foot pain.  Patient reports stepping on a nail while at work.  States that she was wearing crocs.  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  Patient unaware of how far the nail went in but did pull out of her shoe.  Unaware of last tetanus.  Denies any pain/trauma elsewhere. On exam, punctate wound appreciated plantar aspect of left foot between 1st and 2nd metatarsal distally.  X-ray did not show obvious foreign body.  Tdap updated on the ED.  Will place patient empirically on antibiotics for prevention of infection.  Recommended wound care at home.  Treatment plan discussed with patient and she had understanding was agreeable to said plan.  Patient overall well-appearing, afebrile in no acute distress. Worrisome signs and symptoms were discussed with the patient, and the patient acknowledged understanding to return to the ED if noticed. Patient was stable upon discharge. ]        Final Clinical Impression(s) / ED Diagnoses Final diagnoses:  Puncture wound of left foot, initial encounter    Rx / DC Orders ED Discharge Orders     None         Turner Butter, [place] 01/10/24 1706    Scarlette Currier, MD 01/11/24 1415

## 2024-01-11 LAB — GC/CHLAMYDIA PROBE AMP (~~LOC~~) NOT AT ARMC
Chlamydia: NEGATIVE
Comment: NEGATIVE
Comment: NORMAL
Neisseria Gonorrhea: NEGATIVE

## 2024-01-21 ENCOUNTER — Ambulatory Visit
Admission: RE | Admit: 2024-01-21 | Discharge: 2024-01-21 | Disposition: A | Discharge status: Home / Self Care | Source: Ambulatory Visit | Attending: Neurology | Admitting: Neurology

## 2024-01-21 DIAGNOSIS — G40309 Generalized idiopathic epilepsy and epileptic syndromes, not intractable, without status epilepticus: Secondary | ICD-10-CM | POA: Diagnosis not present

## 2024-01-21 MED ORDER — GADOPICLENOL 0.5 MMOL/ML IV SOLN
7.5000 mL | Freq: Once | INTRAVENOUS | Status: AC | PRN
  Administered 2024-01-21: 7.5 mL via INTRAVENOUS

## 2024-01-21 MED ORDER — GADOPICLENOL 0.5 MMOL/ML IV SOLN: 7.5000 mL | Freq: Once | INTRAVENOUS | Status: DC | PRN

## 2024-01-22 ENCOUNTER — Ambulatory Visit: Payer: Self-pay | Admitting: Neurology

## 2024-01-22 ENCOUNTER — Telehealth: Payer: Self-pay | Admitting: Neurology

## 2024-01-22 NOTE — Telephone Encounter (Signed)
 Pt called in regards to getting result from  MRI

## 2024-01-22 NOTE — Telephone Encounter (Signed)
 Dr. Samara Crest sent pt result of MRI. See below. She read result after she called in.

## 2024-01-24 ENCOUNTER — Other Ambulatory Visit

## 2024-01-25 NOTE — BH Specialist Note (Signed)
 Integrated Behavioral Health via Telemedicine Visit  01/29/2024 MARESHA ANASTOS [id number]  Number of Integrated Behavioral Health Clinician visits: 3- Third Visit  Session Start time: [phone]   Session End time: 0908  Total time in minutes: 22   Referring Provider: Werner Hamlet, DO Patient/Family location: Work/[facility] Provider location: Center for [employer] at [facility]  All persons participating in visit: Patient Elizabeth Ray and [facility] Devynne Sturdivant   Types of Service: Individual psychotherapy and Video visit  I connected with Yarnell R Rubey and/or Dalylah R Ard's n/a via  Telephone or Video Enabled Telemedicine Application  (Video is Caregility application) and verified that I am speaking with the correct person using two identifiers. Discussed confidentiality: Yes   I discussed the limitations of telemedicine and the availability of in person appointments.  Discussed there is a possibility of technology failure and discussed alternative modes of communication if that failure occurs.  I discussed that engaging in this telemedicine visit, they consent to the provision of behavioral healthcare and the services will be billed under their insurance.  Patient and/or legal guardian expressed understanding and consented to Telemedicine visit: Yes   Presenting Concerns: Patient and/or family reports the following symptoms/concerns: Increasing life stress (last housing fell through, temporary move with coworker, financial stress with court fee, parking ticket, rental car with repairs needed, husband no longer helping with housing cost/blocking her); pt's main goals right now are to find permanent housing, reliable transportation. Duration of problem: Ongoing; Severity of problem: moderately severe  Patient and/or Family's Strengths/Protective Factors: Sense of purpose and Physical Health (exercise, healthy diet, medication compliance, etc.)  Goals  Addressed: Patient will:  Reduce symptoms of: anxiety, depression, and stress   Increase knowledge and/or ability of: stress reduction   Demonstrate ability to: Increase healthy adjustment to current life circumstances  Progress towards Goals: Ongoing  Interventions: Interventions utilized:  Supportive Reflection Standardized Assessments completed: Not Needed  Patient and/or Family Response: Patient agrees with treatment plan.   Assessment: Patient currently experiencing PTSD; Grief; Psychosocial stress.   Patient may benefit from continued therapeutic intervention. .  Plan: Follow up with behavioral health clinician on : Three weeks; Call Janari Gagner at [phone], as needed. Behavioral recommendations:  -Continue allowing time and space for grieving losses -Continue working to save up for move in two months; continue housing search -Continue prioritizing healthy self-care (kindness to self, regular meals, adequate rest; allowing practical help from supportive friends and family), especially during this transition time, as discussed   Referral(s): Integrated [employer] (In Clinic)  I discussed the assessment and treatment plan with the patient and/or parent/guardian. They were provided an opportunity to ask questions and all were answered. They agreed with the plan and demonstrated an understanding of the instructions.   They were advised to call back or seek an in-person evaluation if the symptoms worsen or if the condition fails to improve as anticipated.  [place] Kipper, LCSW     12/19/2023    9:54 AM 01/24/2023    2:03 PM 12/05/2021   12:12 PM 10/07/2021    8:42 AM 08/18/2018   10:44 AM  Depression screen PHQ 2/9  Decreased Interest 1 1 0 0 2  Down, Depressed, Hopeless 1 0 0 0 2  PHQ - 2 Score 2 1 0 0 4  Altered sleeping 3 0   1  Tired, decreased energy 3 2   2   Change in appetite 3 3   3   Feeling  bad or failure about yourself  3 2   2   Trouble  concentrating 3 2   2   Moving slowly or fidgety/restless 1 0   1  Suicidal thoughts 0 0     PHQ-9 Score 18 10   15   Difficult doing work/chores  Somewhat difficult         12/19/2023    9:57 AM 01/24/2023    2:03 PM 08/18/2018   10:44 AM 10/31/2017    5:03 PM  GAD 7 : Generalized Anxiety Score  Nervous, Anxious, on Edge 1 1 2  0  Control/stop worrying 3 1 2 3   Worry too much - different things 3 1 2 3   Trouble relaxing 3 2 2 3   Restless 3 1 2  0  Easily annoyed or irritable 0 3 3 1   Afraid - awful might happen 1 1 2  0  Total GAD 7 Score 14 10 15 10   Anxiety Difficulty  Somewhat difficult

## 2024-01-29 ENCOUNTER — Ambulatory Visit: Admitting: Clinical

## 2024-01-29 DIAGNOSIS — Z658 Other specified problems related to psychosocial circumstances: Secondary | ICD-10-CM

## 2024-01-29 DIAGNOSIS — F4321 Adjustment disorder with depressed mood: Secondary | ICD-10-CM | POA: Diagnosis not present

## 2024-01-29 DIAGNOSIS — F4310 Post-traumatic stress disorder, unspecified: Secondary | ICD-10-CM

## 2024-01-29 NOTE — Patient Instructions (Signed)
 Center for [facility] Healthcare at [facility] [address] [place], [place] [address] [phone] (main office) [phone] [facility] office)  Housing Resources                    [employer] (serves [place], [place], [place], [place], [place], [place], [place], [place], [place], [place], [place], [place], and [place] counties) [address], [place], [place] [address] [phone] DeveloperU.ch  **Rental assistance, Home Rehabilitation,Weatherization Assistance Program, Chief Financial Officer, Housing Voucher Program  [employer] for Housing and [employer] Studies: Proofreader Resources to residents of [place], [place], and [place] Make sure you have your documents ready, including:  (Household income verification: 2 months pay stubs, unemployment/social security award letter, statement of no income for all household members over 50) Photo ID for all household members over 18 Utility Bill/Rent Ledger/Lease: must show past due amount for utilities/rent, or the rental agreement if rent is current 2. Start your application online or by paper (in [place] or [place]) at:     [email or web address].castaneda.[email or web address]  3. Once you have completed the online application, you will get an email confirmation message from the county. Expect to hear back by phone or email at least 6-10 weeks from submitting your application.  4. While you wait:  Call [phone] to check in on your application Let your landlord know that you've applied. Your landlord will be asked to submit documents (W-9) during this application process. Payments will be made directly to the landlord/property management company and utility company Rent or utility assistance for [employer], [place], and [place] Apply at https://rb.gy/[email or web address] Questions? Call or email Renee at  [phone] or [email or web address]   Eviction Mediation Program: The HOPE Program Https://www.rebuild.BedroomRental.com.cy HOPE Progam serves low-income renters in [place]  counties, defined as less than or equal to 80% of the area median income for the county where the renter lives. In the following 12 counties, you should apply to your local rent and utility assistance program INSTEAD OF the HOPE Program: [place], [place], [place], [place], [place], [place], [place], [place], [place], [place], [place], [place]  If you live outside of [place], contact HOPE call center at [phone] to talk to a Program Representative Monday-Friday, 8am-5pm Note that Native American tribes also received federal funding for rent and utility assistance programs. Recognized members of the following tribes will be served by programs managed by tribal governments, including: [place], [place], [place], [place] of [place]  and [facility] Eviction Mediation Coordinator, [place], [phone] [email or web address] .[employer], [place], [phone] [email or web address]   Housing Resources [facility] Authority- [place] [address] [place], [place] [address] [phone] www.gha-[place].[facility] Housing Coalition [facility] FIRST):  [address] Marya Smack [place], [place] [address] [phone] PhoneCaptions.ch **Programs include: Foreclosure Prevention and Housing Counseling, Healthy Homes/Tenant Advocacy, Homeless Prevention and Housing Assistance  Government [facility] [address], [address], [place], [place] [address] [phone] www.PaintingEmporium.co.za **housing applications/recertification; tax payment relief/exemption under specific qualifications  [facility] [address] [place], [place]  [address] www.onlinegreensboro.com/~maryshouse **transitional housing for women in recovery who have minor children or are pregnant  [facility] [address] [place], [place], [place] [address] https://johnson-smith.net/  **emergency shelter and support services for families facing homelessness  [facility] [employer] [address] [place], [place] [address] [phone] www.youthfocus.org **transitional housing to pregnant women; emergency housing for youth who have run away,  are experiencing a family crisis, are victims of abuse or neglect, or are homeless  [facility] [address], [place], [place] [address] [phone] ircgso.org **Drop-in center for people experiencing homelessness; overnight warming center when temperature is 25 degrees or below  [employer] [address] [place], [place], [place] [address] [phone] https://reentrystaffingagency.org/ **help with affordable housing to people experiencing homelessness or unemployment due to incarceration  [facility] [address] [place], [place] [address] [phone] www.greensborourbanministry.org  **emergency and transitional housing, rent/mortgage assistance, utility assistance  [facility] [address] [place], [place] [address] [phone] www.salvationarmyofgreensboro.org **emergency and transitional housing  [employer] for [employer] [address], [place], [place] [address] [phone] Www.habitatgreensboro.2201 [facility]   [employer] [address], [place], [place] [address] [phone] https://chshousing.org **Home Ownership/Affordable Housing Program and [facility]  [employer] [address], [place], [place] [address] [phone] PaidValue.com.cy **home buyer education courses, foreclosure prevention  [facility] [address] [place], [place] [address] [phone] DefMagazine.is **Environmental Exposure Assessment (investigation of homes where either children or pregnant women with a confirmed elevated blood lead level reside)  [place]  Division of Vocational [facility] [address] [place], [place] [address] [phone] ShowReturn.ca **Home Expense Assistance/Repairs Program; offers home accessibility updates, such as ramps or bars in the bathroom  [facility] [address] [place], [place] [address] [phone] https://www.self-help.org/locations/[address] [place] [address]-branch **Offers credit-building and banking services to people unable to use traditional banking

## 2024-02-03 ENCOUNTER — Other Ambulatory Visit: Payer: Self-pay

## 2024-02-03 ENCOUNTER — Emergency Department (HOSPITAL_BASED_OUTPATIENT_CLINIC_OR_DEPARTMENT_OTHER)
Admission: EM | Admit: 2024-02-03 | Discharge: 2024-02-03 | Disposition: A | Discharge status: Against Medical Advice | Attending: Emergency Medicine | Admitting: Emergency Medicine

## 2024-02-03 ENCOUNTER — Encounter (HOSPITAL_BASED_OUTPATIENT_CLINIC_OR_DEPARTMENT_OTHER): Payer: Self-pay | Admitting: Emergency Medicine

## 2024-02-03 DIAGNOSIS — R1030 Lower abdominal pain, unspecified: Secondary | ICD-10-CM | POA: Diagnosis not present

## 2024-02-03 DIAGNOSIS — O26899 Other specified pregnancy related conditions, unspecified trimester: Secondary | ICD-10-CM | POA: Diagnosis not present

## 2024-02-03 DIAGNOSIS — Z5321 Procedure and treatment not carried out due to patient leaving prior to being seen by health care provider: Secondary | ICD-10-CM | POA: Insufficient documentation

## 2024-02-03 DIAGNOSIS — Z3A00 Weeks of gestation of pregnancy not specified: Secondary | ICD-10-CM | POA: Diagnosis not present

## 2024-02-03 LAB — URINALYSIS, ROUTINE W REFLEX MICROSCOPIC
Bacteria, UA: NONE SEEN
Bilirubin Urine: NEGATIVE
Glucose, UA: NEGATIVE mg/dL
Hgb urine dipstick: NEGATIVE
Ketones, ur: NEGATIVE mg/dL
Leukocytes,Ua: NEGATIVE
Nitrite: NEGATIVE
Protein, ur: NEGATIVE mg/dL
Specific Gravity, Urine: 1.015 (ref 1.005–1.030)
pH: 7 (ref 5.0–8.0)

## 2024-02-03 LAB — PREGNANCY, URINE: Preg Test, Ur: POSITIVE — AB

## 2024-02-03 NOTE — ED Triage Notes (Addendum)
  Patient comes in with lower abdominal cramping that has been going on since yesterday.  Patient states she took a home pregnancy test yesterday and it was positive.  Was seen on 5/1 and urine preg was negative.  Patient states she had miscarriage in February and wanted to make sure everything was ok.  Denies any bleeding or discharge.  Pain 5/10, cramping.  No OTC meds taken.

## 2024-02-04 ENCOUNTER — Emergency Department (HOSPITAL_BASED_OUTPATIENT_CLINIC_OR_DEPARTMENT_OTHER)

## 2024-02-04 ENCOUNTER — Emergency Department (HOSPITAL_BASED_OUTPATIENT_CLINIC_OR_DEPARTMENT_OTHER)
Admission: EM | Admit: 2024-02-04 | Discharge: 2024-02-04 | Disposition: A | Discharge status: Home / Self Care | Attending: Emergency Medicine | Admitting: Emergency Medicine

## 2024-02-04 ENCOUNTER — Other Ambulatory Visit: Payer: Self-pay

## 2024-02-04 ENCOUNTER — Encounter (HOSPITAL_BASED_OUTPATIENT_CLINIC_OR_DEPARTMENT_OTHER): Payer: Self-pay

## 2024-02-04 DIAGNOSIS — O26899 Other specified pregnancy related conditions, unspecified trimester: Secondary | ICD-10-CM | POA: Diagnosis not present

## 2024-02-04 DIAGNOSIS — O26891 Other specified pregnancy related conditions, first trimester: Secondary | ICD-10-CM | POA: Diagnosis not present

## 2024-02-04 DIAGNOSIS — R1031 Right lower quadrant pain: Secondary | ICD-10-CM | POA: Diagnosis not present

## 2024-02-04 DIAGNOSIS — Z87891 Personal history of nicotine dependence: Secondary | ICD-10-CM | POA: Diagnosis not present

## 2024-02-04 DIAGNOSIS — Z3A00 Weeks of gestation of pregnancy not specified: Secondary | ICD-10-CM | POA: Insufficient documentation

## 2024-02-04 DIAGNOSIS — O3680X0 Pregnancy with inconclusive fetal viability, not applicable or unspecified: Secondary | ICD-10-CM | POA: Diagnosis not present

## 2024-02-04 DIAGNOSIS — Z3201 Encounter for pregnancy test, result positive: Secondary | ICD-10-CM

## 2024-02-04 DIAGNOSIS — R1030 Lower abdominal pain, unspecified: Secondary | ICD-10-CM | POA: Diagnosis not present

## 2024-02-04 LAB — CBC WITH DIFFERENTIAL/PLATELET
Abs Immature Granulocytes: 0.01 10*3/uL (ref 0.00–0.07)
Basophils Absolute: 0 10*3/uL (ref 0.0–0.1)
Basophils Relative: 1 %
Eosinophils Absolute: 0.2 10*3/uL (ref 0.0–0.5)
Eosinophils Relative: 3 %
HCT: 38.1 % (ref 36.0–46.0)
Hemoglobin: 12.4 g/dL (ref 12.0–15.0)
Immature Granulocytes: 0 %
Lymphocytes Relative: 29 %
Lymphs Abs: 1.8 10*3/uL (ref 0.7–4.0)
MCH: 29.1 pg (ref 26.0–34.0)
MCHC: 32.5 g/dL (ref 30.0–36.0)
MCV: 89.4 fL (ref 80.0–100.0)
Monocytes Absolute: 0.4 10*3/uL (ref 0.1–1.0)
Monocytes Relative: 6 %
Neutro Abs: 4 10*3/uL (ref 1.7–7.7)
Neutrophils Relative %: 61 %
Platelets: 269 10*3/uL (ref 150–400)
RBC: 4.26 MIL/uL (ref 3.87–5.11)
RDW: 12.6 % (ref 11.5–15.5)
WBC: 6.5 10*3/uL (ref 4.0–10.5)
nRBC: 0 % (ref 0.0–0.2)

## 2024-02-04 LAB — BASIC METABOLIC PANEL WITH GFR
Anion gap: 10 (ref 5–15)
BUN: 8 mg/dL (ref 6–20)
CO2: 24 mmol/L (ref 22–32)
Calcium: 9.3 mg/dL (ref 8.9–10.3)
Chloride: 103 mmol/L (ref 98–111)
Creatinine, Ser: 0.72 mg/dL (ref 0.44–1.00)
GFR, Estimated: >60 mL/min (ref >60)
Glucose, Bld: 85 mg/dL (ref 70–99)
Potassium: 4.1 mmol/L (ref 3.5–5.1)
Sodium: 137 mmol/L (ref 135–145)

## 2024-02-04 LAB — URINALYSIS, ROUTINE W REFLEX MICROSCOPIC
Bacteria, UA: NONE SEEN
Bilirubin Urine: NEGATIVE
Glucose, UA: NEGATIVE mg/dL
Hgb urine dipstick: NEGATIVE
Ketones, ur: NEGATIVE mg/dL
Leukocytes,Ua: NEGATIVE
Nitrite: NEGATIVE
Protein, ur: NEGATIVE mg/dL
Specific Gravity, Urine: 1.015 (ref 1.005–1.030)
pH: 7.5 (ref 5.0–8.0)

## 2024-02-04 LAB — HCG, QUANTITATIVE, PREGNANCY: hCG, Beta Chain, Quant, S: 58 m[IU]/mL — ABNORMAL HIGH (ref <5)

## 2024-02-04 NOTE — Discharge Instructions (Addendum)
 Your hCG today was 58.  You can follow-up with the  [facility] at [address] in 2 days for repeat hCG test.  Your ultrasound did not show a pregnancy, but this is very early.  Like we discussed, you are still early for your menstrual cycle.

## 2024-02-04 NOTE — ED Triage Notes (Signed)
 In for eval of slight RLQ abd pain. LMP 01/07/2024. Home pregnancy test 2 days ago was positive. Denies vaginal bleeding.

## 2024-02-04 NOTE — ED Notes (Signed)
 Pt given discharge instructions. Opportunities given for questions. Follow-up care reviewed with patient. Pt verbalizes understanding. Verlinda Gloss, RN
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
## 2024-02-06 ENCOUNTER — Other Ambulatory Visit: Payer: Self-pay

## 2024-02-06 ENCOUNTER — Ambulatory Visit

## 2024-02-06 ENCOUNTER — Emergency Department (HOSPITAL_BASED_OUTPATIENT_CLINIC_OR_DEPARTMENT_OTHER)
Admission: EM | Admit: 2024-02-06 | Discharge: 2024-02-06 | Disposition: A | Discharge status: Home / Self Care | Attending: Emergency Medicine | Admitting: Emergency Medicine

## 2024-02-06 ENCOUNTER — Encounter (HOSPITAL_BASED_OUTPATIENT_CLINIC_OR_DEPARTMENT_OTHER): Payer: Self-pay | Admitting: Emergency Medicine

## 2024-02-06 DIAGNOSIS — Z3A00 Weeks of gestation of pregnancy not specified: Secondary | ICD-10-CM | POA: Diagnosis not present

## 2024-02-06 DIAGNOSIS — O0281 Inappropriate change in quantitative human chorionic gonadotropin (hCG) in early pregnancy: Secondary | ICD-10-CM | POA: Insufficient documentation

## 2024-02-06 DIAGNOSIS — F17210 Nicotine dependence, cigarettes, uncomplicated: Secondary | ICD-10-CM | POA: Insufficient documentation

## 2024-02-06 DIAGNOSIS — O99330 Smoking (tobacco) complicating pregnancy, unspecified trimester: Secondary | ICD-10-CM | POA: Diagnosis not present

## 2024-02-06 DIAGNOSIS — R7989 Other specified abnormal findings of blood chemistry: Secondary | ICD-10-CM

## 2024-02-06 DIAGNOSIS — Z3490 Encounter for supervision of normal pregnancy, unspecified, unspecified trimester: Secondary | ICD-10-CM

## 2024-02-06 DIAGNOSIS — O26899 Other specified pregnancy related conditions, unspecified trimester: Secondary | ICD-10-CM | POA: Insufficient documentation

## 2024-02-06 DIAGNOSIS — Z3689 Encounter for other specified antenatal screening: Secondary | ICD-10-CM

## 2024-02-06 LAB — HCG, QUANTITATIVE, PREGNANCY: hCG, Beta Chain, Quant, S: 177 m[IU]/mL — ABNORMAL HIGH (ref <5)

## 2024-02-06 NOTE — ED Provider Notes (Signed)
 [place] EMERGENCY DEPARTMENT AT [facility] Provider Note   CSN: [id number] Arrival date & time: 02/06/24  1035     History  Chief Complaint  Patient presents with   HCG testing    Elizabeth Ray is a 23 y.o. female.  HPI   24 year old female presents emergency department with request of hCG recheck.  Was seen in the ED 02/04/2024 with right lower abdominal pain and positive at-home pregnancy test.  Had pelvic ultrasound at that time which showed pregnancy of unknown anatomic location.  hCG at that time which was 54.  Since then, patient has intermittent lower abdominal pain.  Was seen for right lower abdominal pain but states she is has not had cramping somewhat diffuse lower abdominal pain that has been very intermittent since her prior ED visit.  Denies any vaginal bleeding, nausea, vomiting, fever, urinary symptoms, change in bowel habits.  States she is mainly here to have her hCG level checked.  Past medical history significant for seizure, HD, bipolar, MDD, migraine, miscarriage  Home Medications Prior to Admission medications   Medication Sig Start Date End Date Taking? Authorizing Provider  benzonatate  (TESSALON ) 100 MG capsule Take 1 capsule (100 mg total) by mouth every 8 (eight) hours. Patient not taking: Reported on 11/29/2023 10/30/23   Albertus Hughs, DO  Boric Acid Vaginal 600 MG SUPP Place 600 mg vaginally at bedtime. Patient not taking: Reported on 11/29/2023 11/07/23   Everlina Hock, NP  cefadroxil  (DURICEF) 500 MG capsule Take 1 capsule (500 mg total) by mouth 2 (two) times daily. 01/10/24   [place] Butter, PA  doxycycline  (VIBRAMYCIN ) 100 MG capsule Take 1 capsule (100 mg total) by mouth 2 (two) times daily. 12/13/23   Curatolo, Adam, DO  levETIRAcetam  (KEPPRA ) 500 MG tablet Take 1 tablet (500 mg total) by mouth 2 (two) times daily. 11/27/23 11/21/24  Gaymon, Amadou, MD  ondansetron  (ZOFRAN -ODT) 4 MG disintegrating tablet 4mg  ODT q4 hours prn  nausea/vomit Patient not taking: Reported on 11/29/2023 10/30/23   Floyd, Dan, DO  rizatriptan  (MAXALT ) 5 MG tablet Take 1 tablet (5 mg total) by mouth as needed for migraine. May repeat in 2 hours if needed Patient not taking: Reported on 11/29/2023 01/24/23   Everlina Hock, NP      Allergies    Patient has no known allergies.    Review of Systems   Review of Systems  All other systems reviewed and are negative.   Physical Exam Updated Vital Signs BP 109/64   Pulse 79   Temp 98 F (36.7 C) (Oral)   Resp 16   Wt 80 kg   LMP 11/13/2023 (Exact Date)   SpO2 95%   BMI 31.24 kg/m  Physical Exam Vitals and nursing note reviewed.  Constitutional:      General: She is not in acute distress.    Appearance: She is well-developed.  HENT:     Head: Normocephalic and atraumatic.  Eyes:     Conjunctiva/sclera: Conjunctivae normal.  Cardiovascular:     Rate and Rhythm: Normal rate and regular rhythm.     Heart sounds: No murmur heard. Pulmonary:     Effort: Pulmonary effort is normal. No respiratory distress.     Breath sounds: Normal breath sounds.  Abdominal:     Palpations: Abdomen is soft.     Tenderness: There is no abdominal tenderness. There is no right CVA tenderness, left CVA tenderness or guarding.     Comments: No abdominal tenderness  light/deep palpation.  Musculoskeletal:        General: No swelling.     Cervical back: Neck supple.  Skin:    General: Skin is warm and dry.     Capillary Refill: Capillary refill takes less than 2 seconds.  Neurological:     Mental Status: She is alert.  Psychiatric:        Mood and Affect: Mood normal.     ED Results / Procedures / Treatments   Labs (all labs ordered are listed, but only abnormal results are displayed) Labs Reviewed  HCG, QUANTITATIVE, PREGNANCY    EKG None  Radiology No results found.  Procedures Procedures    Medications Ordered in ED Medications - No data to display  ED Course/ Medical  Decision Making/ A&P                                 Medical Decision Making Amount and/or Complexity of Data Reviewed Labs: ordered.   This patient presents to the ED for concern of pelvic cramping, repeat blood test, this involves an extensive number of treatment options, and is a complaint that carries with it a high risk of complications and morbidity.  The differential diagnosis includes ectopic pregnancy, intrauterine pregnancy, miscarriage, other   Co morbidities that complicate the patient evaluation  See HPI   Additional history obtained:  Additional history obtained from EMR External records from outside source obtained and reviewed including hospital records   Lab Tests:  I Ordered, and personally interpreted labs.  The pertinent results include: hCG of 177   Imaging Studies ordered:  N/a   Cardiac Monitoring: / EKG:  N/a   Consultations Obtained:  N/a   Problem List / ED Course / Critical interventions / Medication management  Elevated hCG  Reevaluation of the patient showed that the patient stayed the same I have reviewed the patients home medicines and have made adjustments as needed   Social Determinants of Health:  Cigarette use.  Denies illicit drug use.   Test / Admission - Considered:  Elevated serum hCG Vitals signs within normal range and stable throughout visit. Laboratory studies significant for: See above 23 year old female presents emergency department with request of hCG recheck.  Was seen in the ED 02/04/2024 with right lower abdominal pain and positive at-home pregnancy test.  Had pelvic ultrasound at that time which showed pregnancy of unknown anatomic location.  hCG at that time which was 54.  Since then, patient has intermittent lower abdominal pain.  Was seen for right lower abdominal pain but states she is has not had cramping somewhat diffuse lower abdominal pain that has been very intermittent since her prior ED visit.   Denies any vaginal bleeding, nausea, vomiting, fever, urinary symptoms, change in bowel habits.  States she is mainly here to have her hCG level checked. On exam, no reproducible abdominal/pelvic tenderness.  hCG showed appropriate increase from 2 days ago.  Had negative ultrasound at that time given early pregnancy without visualization.  Will recommend follow-up with OB/GYN with strict return precautions discussed at length.  Treatment plan discussed with patient and she acknowledged understanding was agreeable to said plan.  Patient will well-appearing, afebrile in no acute distress. Worrisome signs and symptoms were discussed with the patient, and the patient acknowledged understanding to return to the ED if noticed. Patient was stable upon discharge.          Final Clinical  Impression(s) / ED Diagnoses Final diagnoses:  None    Rx / DC Orders ED Discharge Orders     None         [place] Butter, [place] 02/06/24 1239    Scarlette Currier, MD 02/07/24 [phone]

## 2024-02-06 NOTE — ED Notes (Signed)
 Pt alert and oriented X 4 at the time of discharge. RR even and unlabored. No acute distress noted. Pt verbalized understanding of discharge instructions as discussed. Pt ambulatory to lobby at time of discharge.

## 2024-02-06 NOTE — ED Triage Notes (Signed)
 Pt here for HCG level check , was told to come in 2 days to have it rechecked .

## 2024-02-06 NOTE — Discharge Instructions (Signed)
 As discussed, your pregnancy level has doubled.  This is reassuring from a intrauterine pregnancy standpoint.  Is also reassuring that your cramping pain is somewhat migrating in your lower abdomen.  This could be a normal finding but also, the observant for possible signs of miscarriage.  Will recommend follow-up with OB/GYN in the outpatient setting.  Return if you develop a localized pain that is severe, fever, vaginal symptoms as we discussed.  Otherwise, recommend outpatient follow-up with OB/GYN.

## 2024-02-07 LAB — BETA HCG QUANT (REF LAB): hCG Quant: 171 m[IU]/mL

## 2024-02-08 ENCOUNTER — Other Ambulatory Visit: Payer: Self-pay

## 2024-02-08 ENCOUNTER — Emergency Department (HOSPITAL_BASED_OUTPATIENT_CLINIC_OR_DEPARTMENT_OTHER)

## 2024-02-08 ENCOUNTER — Emergency Department (HOSPITAL_BASED_OUTPATIENT_CLINIC_OR_DEPARTMENT_OTHER)
Admission: EM | Admit: 2024-02-08 | Discharge: 2024-02-09 | Disposition: A | Discharge status: Home / Self Care | Attending: Emergency Medicine | Admitting: Emergency Medicine

## 2024-02-08 ENCOUNTER — Ambulatory Visit: Payer: Self-pay | Admitting: Family Medicine

## 2024-02-08 ENCOUNTER — Encounter (HOSPITAL_BASED_OUTPATIENT_CLINIC_OR_DEPARTMENT_OTHER): Payer: Self-pay

## 2024-02-08 DIAGNOSIS — O26891 Other specified pregnancy related conditions, first trimester: Secondary | ICD-10-CM | POA: Diagnosis not present

## 2024-02-08 DIAGNOSIS — R1032 Left lower quadrant pain: Secondary | ICD-10-CM | POA: Diagnosis not present

## 2024-02-08 DIAGNOSIS — Z3A01 Less than 8 weeks gestation of pregnancy: Secondary | ICD-10-CM | POA: Insufficient documentation

## 2024-02-08 DIAGNOSIS — R1084 Generalized abdominal pain: Secondary | ICD-10-CM

## 2024-02-08 DIAGNOSIS — Z3A11 11 weeks gestation of pregnancy: Secondary | ICD-10-CM | POA: Diagnosis not present

## 2024-02-08 LAB — CBC
HCT: 39.5 % (ref 36.0–46.0)
Hemoglobin: 13 g/dL (ref 12.0–15.0)
MCH: 29 pg (ref 26.0–34.0)
MCHC: 32.9 g/dL (ref 30.0–36.0)
MCV: 88 fL (ref 80.0–100.0)
Platelets: 270 10*3/uL (ref 150–400)
RBC: 4.49 MIL/uL (ref 3.87–5.11)
RDW: 12.5 % (ref 11.5–15.5)
WBC: 6.8 10*3/uL (ref 4.0–10.5)
nRBC: 0 % (ref 0.0–0.2)

## 2024-02-08 LAB — LIPASE, BLOOD: Lipase: 26 U/L (ref 11–51)

## 2024-02-08 LAB — COMPREHENSIVE METABOLIC PANEL WITH GFR
ALT: 21 U/L (ref 0–44)
AST: 26 U/L (ref 15–41)
Albumin: 4.4 g/dL (ref 3.5–5.0)
Alkaline Phosphatase: 93 U/L (ref 38–126)
Anion gap: 14 (ref 5–15)
BUN: 10 mg/dL (ref 6–20)
CO2: 22 mmol/L (ref 22–32)
Calcium: 9.4 mg/dL (ref 8.9–10.3)
Chloride: 102 mmol/L (ref 98–111)
Creatinine, Ser: 0.76 mg/dL (ref 0.44–1.00)
GFR, Estimated: >60 mL/min (ref >60)
Glucose, Bld: 92 mg/dL (ref 70–99)
Potassium: 3.9 mmol/L (ref 3.5–5.1)
Sodium: 138 mmol/L (ref 135–145)
Total Bilirubin: 0.4 mg/dL (ref 0.0–1.2)
Total Protein: 7.3 g/dL (ref 6.5–8.1)

## 2024-02-08 LAB — URINALYSIS, ROUTINE W REFLEX MICROSCOPIC
Bacteria, UA: NONE SEEN
Bilirubin Urine: NEGATIVE
Glucose, UA: NEGATIVE mg/dL
Hgb urine dipstick: NEGATIVE
Ketones, ur: NEGATIVE mg/dL
Leukocytes,Ua: NEGATIVE
Nitrite: NEGATIVE
Specific Gravity, Urine: 1.023 (ref 1.005–1.030)
pH: 8 (ref 5.0–8.0)

## 2024-02-08 LAB — HCG, QUANTITATIVE, PREGNANCY: hCG, Beta Chain, Quant, S: 589 m[IU]/mL — ABNORMAL HIGH (ref <5)

## 2024-02-08 LAB — PREGNANCY, URINE: Preg Test, Ur: POSITIVE — AB

## 2024-02-08 NOTE — ED Triage Notes (Signed)
 Complaining of pain all day in the abdomen. From the back around to the left lower abdomen. Was just confirmed to be [redacted] weeks along.

## 2024-02-08 NOTE — ED Provider Notes (Signed)
 [place] EMERGENCY DEPARTMENT AT [facility] Provider Note   CSN: [id number] Arrival date & time: 02/08/24  2043     History Chief Complaint  Patient presents with   Abdominal Pain    HPI Elizabeth Ray is a 23 y.o. female presenting for chief complaint of LLQ pain. No bleeding or n/v or other symptoms.  LMP January 07 2024 G1P0  Patient's recorded medical, surgical, social, medication list and allergies were reviewed in the Snapshot window as part of the initial history.   Review of Systems   Review of Systems  Constitutional:  Negative for chills and fever.  HENT:  Negative for ear pain and sore throat.   Eyes:  Negative for pain and visual disturbance.  Respiratory:  Negative for cough and shortness of breath.   Cardiovascular:  Negative for chest pain and palpitations.  Gastrointestinal:  Negative for abdominal pain and vomiting.  Genitourinary:  Negative for dysuria and hematuria.  Musculoskeletal:  Negative for arthralgias and back pain.  Skin:  Negative for color change and rash.  Neurological:  Negative for seizures and syncope.  All other systems reviewed and are negative.   Physical Exam Updated Vital Signs BP 128/62 (BP Location: Right Arm)   Pulse (!) 112   Temp 98 F (36.7 C) (Oral)   Resp 18   Ht 5\' 9"  (1.753 m)   Wt 74.8 kg   LMP 11/13/2023 (Exact Date)   SpO2 100%   BMI 24.37 kg/m  Physical Exam Vitals and nursing note reviewed.  Constitutional:      General: She is not in acute distress.    Appearance: She is well-developed.  HENT:     Head: Normocephalic and atraumatic.  Eyes:     Conjunctiva/sclera: Conjunctivae normal.  Cardiovascular:     Rate and Rhythm: Normal rate and regular rhythm.     Heart sounds: No murmur heard. Pulmonary:     Effort: Pulmonary effort is normal. No respiratory distress.     Breath sounds: Normal breath sounds.  Abdominal:     General: There is no distension.     Palpations: Abdomen is soft.      Tenderness: There is no abdominal tenderness. There is no right CVA tenderness or left CVA tenderness.  Musculoskeletal:        General: No swelling or tenderness. Normal range of motion.     Cervical back: Neck supple.  Skin:    General: Skin is warm and dry.  Neurological:     General: No focal deficit present.     Mental Status: She is alert and oriented to person, place, and time. Mental status is at baseline.     Cranial Nerves: No cranial nerve deficit.      ED Course/ Medical Decision Making/ A&P    Procedures Procedures   Medications Ordered in ED Medications - No data to display Medical Decision Making:   KENNADEE WALTHOUR is a 23 y.o. female who presented to the ED today with abdominal pain, detailed above.    Patient placed on continuous vitals and telemetry monitoring while in ED which was reviewed periodically.  Complete initial physical exam performed, notably the patient  was HDS in NAD.     Reviewed and confirmed nursing documentation for past medical history, family history, social history.    Initial Assessment:   With the patient's presentation of abdominal pain, most likely diagnosis is nonspecific etiology. Other diagnoses were considered including (but not limited to) gastroenteritis, colitis, small  bowel obstruction, appendicitis, cholecystitis, pancreatitis, nephrolithiasis, UTI, pyleonephritis, ruptured ectopic pregnancy, PID, ovarian torsion. These are considered less likely due to history of present illness and physical exam findings.   This is most consistent with an acute life/limb threatening illness complicated by underlying chronic conditions.   Initial Plan:  CBC/CMP to evaluate for underlying infectious/metabolic etiology for patient's abdominal pain  Lipase to evaluate for pancreatitis  EKG to evaluate for cardiac source of pain  Pelvic US  to evaluate for torsion  Urinalysis and repeat physical assessment to evaluate for UTI/Pyelonpehritis   Empiric management of symptoms with escalating pain control and antiemetics as needed.   Initial Study Results:   Laboratory  All laboratory results reviewed without evidence of clinically relevant pathology.    Radiology All images reviewed independentl Agree with radiology report at this time.   US  OB Comp < 14 Wks Result Date: 02/08/2024 EXAM: OBSTETRIC ULTRASOUND FIRST TRIMESTER CLINICAL HISTORY: Pelvic pain. TECHNIQUE: Transabdominal and transvaginal first trimester obstetric pelvic duplex ultrasound was performed with real-time imaging, color flow Doppler imaging, and spectral analysis. COMPARISON: None provided. FINDINGS: UTERUS: No focal myometrial mass. GESTATIONAL SAC(S): Single intrauterine gestational sac measuring 2.2 mm (4 weeks 6 days). No subchorionic hemorrhage. YOLK SAC: Not seen. EMBRYO(<11WK) /FETUS(>=11WK): Not seen. CROWN RUMP LENGTH: Not measured. RATE OF CARDIAC ACTIVITY: No cardiac activity. RIGHT OVARY: Within normal limits, noting a right corpus luteum. Normal arterial and venous flow. LEFT OVARY: Within normal limits. Normal arterial and venous flow. FREE FLUID: No free fluid. MEASUREMENTS ESTIMATED GESTATIONAL AGE BY CURRENT ULTRASOUND: 4 weeks 6 days ESTIMATED GESTATIONAL AGE BY LMP/PRIOR ULTRASOUND: ESTIMATED DUE DATE: IMPRESSION: 1. Single early intrauterine gestational sac, without yolk sac or embryo, measuring 4 weeks 6 days by MSD. Consider follow-up OB ultrasound in 14 days, if clinically warranted. Electronically signed by: Zadie Herter MD 02/08/2024 10:26 PM EDT RP Workstation: [id number]   US  OB Transvaginal Result Date: 02/08/2024 EXAM: OBSTETRIC ULTRASOUND FIRST TRIMESTER CLINICAL HISTORY: Pelvic pain. TECHNIQUE: Transabdominal and transvaginal first trimester obstetric pelvic duplex ultrasound was performed with real-time imaging, color flow Doppler imaging, and spectral analysis. COMPARISON: None provided. FINDINGS: UTERUS: No focal myometrial mass.  GESTATIONAL SAC(S): Single intrauterine gestational sac measuring 2.2 mm (4 weeks 6 days). No subchorionic hemorrhage. YOLK SAC: Not seen. EMBRYO(<11WK) /FETUS(>=11WK): Not seen. CROWN RUMP LENGTH: Not measured. RATE OF CARDIAC ACTIVITY: No cardiac activity. RIGHT OVARY: Within normal limits, noting a right corpus luteum. Normal arterial and venous flow. LEFT OVARY: Within normal limits. Normal arterial and venous flow. FREE FLUID: No free fluid. MEASUREMENTS ESTIMATED GESTATIONAL AGE BY CURRENT ULTRASOUND: 4 weeks 6 days ESTIMATED GESTATIONAL AGE BY LMP/PRIOR ULTRASOUND: ESTIMATED DUE DATE: IMPRESSION: 1. Single early intrauterine gestational sac, without yolk sac or embryo, measuring 4 weeks 6 days by MSD. Consider follow-up OB ultrasound in 14 days, if clinically warranted. Electronically signed by: Zadie Herter MD 02/08/2024 10:26 PM EDT RP Workstation: [id number]   US  OB LESS THAN 14 WEEKS WITH OB TRANSVAGINAL Result Date: 02/04/2024 CLINICAL DATA:  Lower abdominal pain.  Positive pregnancy test. EXAM: OBSTETRIC <14 WK US  AND TRANSVAGINAL OB US  TECHNIQUE: Both transabdominal and transvaginal ultrasound examinations were performed for complete evaluation of the gestation as well as the maternal uterus, adnexal regions, and pelvic cul-de-sac. Transvaginal technique was performed to assess early pregnancy. COMPARISON:  None Available. FINDINGS: Intrauterine gestational sac: None Maternal uterus/adnexae: Endometrial thickness measures 7 mm. Small hemorrhagic left ovarian corpus luteum noted. Normal appearance right ovary. No suspicious adnexal mass or abnormal free fluid  IMPRESSION: Pregnancy of unknown anatomic location (no intrauterine gestational sac or adnexal mass identified). Differential diagnosis includes recent spontaneous abortion, IUP too early to visualize, and non-visualized ectopic pregnancy. Recommend followup of beta-hCG levels, and follow up US  as warranted clinically. Electronically Signed    By: Marlyce Sine M.D.   On: 02/04/2024 08:48   MR BRAIN W WO CONTRAST Result Date: 01/21/2024  [facility] [address] [place], [place] [address] [phone] NEUROIMAGING REPORT STUDY DATE: 01/21/2024 PATIENT NAME: SHIYA FOGELMAN DOB: Sep 01, 2001 MRN: [id number] EXAM: MRI Brain with and without contrast ORDERING CLINICIAN: Cassandra Cleveland, MD CLINICAL HISTORY: 23 year old woman with seizures COMPARISON FILMS: CT 09/07/2023 TECHNIQUE:MRI of the brain with and without contrast was obtained utilizing 5 mm axial slices with T1, T2, T2 flair, SWI and diffusion weighted views.  T1 sagittal, T2 coronal and postcontrast views in the axial and coronal plane were obtained.  CONTRAST: 7.5 ml Vueway  IMAGING SITE:  imaging, [address] [place], [place], [place] FINDINGS: On sagittal images, the spinal cord is imaged caudally to C4 and is normal in caliber.   The contents of the posterior fossa are of normal size and position.   The pituitary gland and optic chiasm appear normal.    Brain volume appears normal.   The ventricles are normal in size and without distortion.  There are no abnormal extra-axial collections of fluid.  The cerebellum and brainstem appears normal.   The deep gray matter appears normal.    There is a 5 mm focus of susceptibility in the right medial frontal subcortical white matter near the genu.  This focus is also mildly hypointense on T1-weighted images and has mild homogenous enhancement after contrast.  The cerebral hemispheres were otherwise normal.  Structures of medial temporal lobes appear normal.  Diffusion weighted images do not show any acute findings..   The orbits appear normal.   The VIIth/VIIIth nerve complex appears normal.  The mastoid air cells appear normal.  Mild mucoperiosteal thickening noted in the maxillary sinuses and With ethmoid air cells.  There is an air-fluid level in the right maxillary sinus.  The other paranasal sinuses appear normal.  Flow  voids are identified within the major intracerebral arteries.  After the infusion of contrast material, a normal enhancement pattern is noted.     This MRI of the brain with and without contrast shows the following: 5 mm focus of susceptibility in the subcortical white matter of the right frontal lobe near the genu of the corpus callosum (best seen on series 10 image 36 through 38, series 17 image 69 and series 18 image 20).  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  It is less likely to represent a cavernous malformation. Mild chronic maxillary and ethmoid sinusitis.  There is also an air-fluid level in the right maxillary sinus that could represent acute sinusitis. No acute findings in the brain INTERPRETING PHYSICIAN: Richard A. Godwin Lat, MD, PhD, FAAN Certified in  Neuroimaging by [employer]   DG Foot Complete Left Result Date: 01/10/2024 CLINICAL DATA:  Left foot puncture wound. EXAM: LEFT FOOT - COMPLETE 3+ VIEW COMPARISON:  February 21, 2022. FINDINGS: There is no evidence of fracture or dislocation. There is no evidence of arthropathy or other focal bone abnormality. Soft tissues are unremarkable. IMPRESSION: Negative. Electronically Signed   By: Rosalene Colon M.D.   On: 01/10/2024 16:16    Final Reassessment and Plan:   No immediate complication.  Still early pregnancy.  Recommended follow-up with gynecology as scheduled for next week.  Discussed return precautions regarding interval worsening and patient expressed understanding.  Disposition:  I have considered need for hospitalization, however, considering all of the above, I believe this patient is stable for discharge at this time.  Patient/family educated about specific return precautions for given chief complaint and symptoms.  Patient/family educated about follow-up with PCP.     Patient/family expressed understanding of return precautions and need for follow-up. Patient  spoken to regarding all imaging and laboratory results and appropriate follow up for these results. All education provided in verbal form with additional information in written form. Time was allowed for answering of patient questions. Patient discharged.    Emergency Department Medication Summary:   Medications - No data to display          Clinical Impression: No diagnosis found.   Data Unavailable   Final Clinical Impression(s) / ED Diagnoses Final diagnoses:  None    Rx / DC Orders ED Discharge Orders     None         Onetha Bile, MD 02/09/24 [phone]

## 2024-02-09 LAB — WET PREP, GENITAL
Clue Cells Wet Prep HPF POC: NONE SEEN
Sperm: NONE SEEN
Trich, Wet Prep: NONE SEEN
WBC, Wet Prep HPF POC: <10 (ref <10)
Yeast Wet Prep HPF POC: NONE SEEN

## 2024-02-10 ENCOUNTER — Encounter: Payer: Self-pay | Admitting: Neurology

## 2024-02-11 ENCOUNTER — Other Ambulatory Visit: Payer: Self-pay | Admitting: Neurology

## 2024-02-11 DIAGNOSIS — Z5181 Encounter for therapeutic drug level monitoring: Secondary | ICD-10-CM

## 2024-02-11 DIAGNOSIS — G40309 Generalized idiopathic epilepsy and epileptic syndromes, not intractable, without status epilepticus: Secondary | ICD-10-CM

## 2024-02-11 NOTE — Telephone Encounter (Signed)
 FYI: Patient said coming today to have labs.

## 2024-02-13 ENCOUNTER — Emergency Department (HOSPITAL_BASED_OUTPATIENT_CLINIC_OR_DEPARTMENT_OTHER)

## 2024-02-13 ENCOUNTER — Other Ambulatory Visit: Payer: Self-pay

## 2024-02-13 ENCOUNTER — Telehealth: Payer: Self-pay

## 2024-02-13 ENCOUNTER — Encounter (HOSPITAL_BASED_OUTPATIENT_CLINIC_OR_DEPARTMENT_OTHER): Payer: Self-pay

## 2024-02-13 ENCOUNTER — Emergency Department (HOSPITAL_BASED_OUTPATIENT_CLINIC_OR_DEPARTMENT_OTHER)
Admission: EM | Admit: 2024-02-13 | Discharge: 2024-02-13 | Disposition: A | Discharge status: Home / Self Care | Attending: Emergency Medicine | Admitting: Emergency Medicine

## 2024-02-13 DIAGNOSIS — Z3A01 Less than 8 weeks gestation of pregnancy: Secondary | ICD-10-CM | POA: Diagnosis not present

## 2024-02-13 DIAGNOSIS — Z79899 Other long term (current) drug therapy: Secondary | ICD-10-CM | POA: Diagnosis not present

## 2024-02-13 DIAGNOSIS — O3680X0 Pregnancy with inconclusive fetal viability, not applicable or unspecified: Secondary | ICD-10-CM

## 2024-02-13 DIAGNOSIS — O26891 Other specified pregnancy related conditions, first trimester: Secondary | ICD-10-CM | POA: Insufficient documentation

## 2024-02-13 LAB — CBC WITH DIFFERENTIAL/PLATELET
Abs Immature Granulocytes: 0.01 10*3/uL (ref 0.00–0.07)
Basophils Absolute: 0 10*3/uL (ref 0.0–0.1)
Basophils Relative: 1 %
Eosinophils Absolute: 0.1 10*3/uL (ref 0.0–0.5)
Eosinophils Relative: 3 %
HCT: 39.7 % (ref 36.0–46.0)
Hemoglobin: 13 g/dL (ref 12.0–15.0)
Immature Granulocytes: 0 %
Lymphocytes Relative: 30 %
Lymphs Abs: 1.4 10*3/uL (ref 0.7–4.0)
MCH: 29.1 pg (ref 26.0–34.0)
MCHC: 32.7 g/dL (ref 30.0–36.0)
MCV: 88.8 fL (ref 80.0–100.0)
Monocytes Absolute: 0.3 10*3/uL (ref 0.1–1.0)
Monocytes Relative: 7 %
Neutro Abs: 2.7 10*3/uL (ref 1.7–7.7)
Neutrophils Relative %: 59 %
Platelets: 269 10*3/uL (ref 150–400)
RBC: 4.47 MIL/uL (ref 3.87–5.11)
RDW: 12.1 % (ref 11.5–15.5)
WBC: 4.5 10*3/uL (ref 4.0–10.5)
nRBC: 0 % (ref 0.0–0.2)

## 2024-02-13 LAB — URINALYSIS, W/ REFLEX TO CULTURE (INFECTION SUSPECTED)
Bilirubin Urine: NEGATIVE
Glucose, UA: NEGATIVE mg/dL
Hgb urine dipstick: NEGATIVE
Ketones, ur: NEGATIVE mg/dL
Leukocytes,Ua: NEGATIVE
Nitrite: NEGATIVE
Protein, ur: NEGATIVE mg/dL
Specific Gravity, Urine: <1.005 — ABNORMAL LOW (ref 1.005–1.030)
pH: 7 (ref 5.0–8.0)

## 2024-02-13 LAB — COMPREHENSIVE METABOLIC PANEL WITH GFR
ALT: 31 U/L (ref 0–44)
AST: 27 U/L (ref 15–41)
Albumin: 4.3 g/dL (ref 3.5–5.0)
Alkaline Phosphatase: 118 U/L (ref 38–126)
Anion gap: 12 (ref 5–15)
BUN: 6 mg/dL (ref 6–20)
CO2: 23 mmol/L (ref 22–32)
Calcium: 10 mg/dL (ref 8.9–10.3)
Chloride: 103 mmol/L (ref 98–111)
Creatinine, Ser: 0.64 mg/dL (ref 0.44–1.00)
GFR, Estimated: >60 mL/min (ref >60)
Glucose, Bld: 86 mg/dL (ref 70–99)
Potassium: 3.8 mmol/L (ref 3.5–5.1)
Sodium: 139 mmol/L (ref 135–145)
Total Bilirubin: <0.2 mg/dL (ref 0.0–1.2)
Total Protein: 7.5 g/dL (ref 6.5–8.1)

## 2024-02-13 LAB — LIPASE, BLOOD: Lipase: 26 U/L (ref 11–51)

## 2024-02-13 LAB — HCG, QUANTITATIVE, PREGNANCY: hCG, Beta Chain, Quant, S: 1978 m[IU]/mL — ABNORMAL HIGH (ref <5)

## 2024-02-13 NOTE — Discharge Instructions (Signed)
 Your history, exam, and evaluation today seem consistent with early pregnancy.  The ultrasound showed a single intrauterine yolk sac and fetal pole but no heartbeat yet as it may be too early.  Your hCG had increased since the last time it was checked and your labs otherwise seem similar to prior.  Your urinalysis did not show convincing evidence of UTI and we feel you are safe for discharge home for outpatient OB/GYN follow-up.  Please continue prenatal vitamins and rest and stay hydrated.  If any symptoms change or worsen acutely, would return to the nearest emergency department.

## 2024-02-13 NOTE — ED Notes (Signed)
 Patient transported to Ultrasound
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
## 2024-02-13 NOTE — ED Provider Notes (Signed)
 [place] EMERGENCY DEPARTMENT AT [facility] Provider Note   CSN: [id number] Arrival date & time: 02/13/24  0631     History  Chief Complaint  Patient presents with   Back Pain    Elizabeth Ray is a 23 y.o. female.  The history is provided by the patient and medical records. No language interpreter was used.  Back Pain Location:  Lumbar spine Quality:  Aching Radiates to:  Does not radiate Pain severity:  Mild Pain is:  Unable to specify Onset quality:  Gradual Duration:  1 day Timing:  Sporadic Progression:  Resolved Chronicity:  New Context: not recent injury   Relieved by:  Nothing Worsened by:  Nothing Associated symptoms: abdominal pain (resiolved now)   Associated symptoms: no chest pain, no dysuria, no fever, no headaches, no numbness, no pelvic pain, no tingling and no weakness   Risk factors: pregnancy        Home Medications Prior to Admission medications   Medication Sig Start Date End Date Taking? Authorizing Provider  benzonatate  (TESSALON ) 100 MG capsule Take 1 capsule (100 mg total) by mouth every 8 (eight) hours. Patient not taking: Reported on 11/29/2023 10/30/23   Albertus Hughs, DO  Boric Acid Vaginal 600 MG SUPP Place 600 mg vaginally at bedtime. Patient not taking: Reported on 11/29/2023 11/07/23   Minna Amass B, NP  cefadroxil  (DURICEF) 500 MG capsule Take 1 capsule (500 mg total) by mouth 2 (two) times daily. 01/10/24   [place] Butter, PA  doxycycline  (VIBRAMYCIN ) 100 MG capsule Take 1 capsule (100 mg total) by mouth 2 (two) times daily. 12/13/23   Curatolo, Adam, DO  levETIRAcetam  (KEPPRA ) 500 MG tablet Take 1 tablet (500 mg total) by mouth 2 (two) times daily. 11/27/23 11/21/24  Jawad, Amadou, MD  ondansetron  (ZOFRAN -ODT) 4 MG disintegrating tablet 4mg  ODT q4 hours prn nausea/vomit Patient not taking: Reported on 11/29/2023 10/30/23   Floyd, Dan, DO  rizatriptan  (MAXALT ) 5 MG tablet Take 1 tablet (5 mg total) by mouth as needed for  migraine. May repeat in 2 hours if needed Patient not taking: Reported on 11/29/2023 01/24/23   Everlina Hock, NP      Allergies    Patient has no known allergies.    Review of Systems   Review of Systems  Constitutional:  Positive for fatigue. Negative for chills, diaphoresis and fever.  HENT:  Negative for congestion.   Respiratory:  Negative for cough, chest tightness, shortness of breath and wheezing.   Cardiovascular:  Negative for chest pain, palpitations and leg swelling.  Gastrointestinal:  Positive for abdominal pain (resiolved now). Negative for constipation, diarrhea, nausea and vomiting.  Genitourinary:  Negative for dysuria, frequency and pelvic pain.  Musculoskeletal:  Positive for back pain. Negative for neck pain and neck stiffness.  Skin:  Negative for rash and wound.  Neurological:  Negative for tingling, weakness, light-headedness, numbness and headaches.  Psychiatric/Behavioral:  Negative for agitation and confusion.   All other systems reviewed and are negative.   Physical Exam Updated Vital Signs BP (!) 92/50   Pulse 96   Resp 12   LMP 11/13/2023 (Exact Date)   SpO2 97%  Physical Exam Vitals and nursing note reviewed.  Constitutional:      General: She is not in acute distress.    Appearance: She is well-developed. She is not ill-appearing, toxic-appearing or diaphoretic.  HENT:     Head: Normocephalic and atraumatic.     Nose: No congestion or rhinorrhea.  Mouth/Throat:     Mouth: Mucous membranes are moist.     Pharynx: No oropharyngeal exudate or posterior oropharyngeal erythema.  Eyes:     Extraocular Movements: Extraocular movements intact.     Conjunctiva/sclera: Conjunctivae normal.     Pupils: Pupils are equal, round, and reactive to light.  Cardiovascular:     Rate and Rhythm: Normal rate and regular rhythm.     Heart sounds: No murmur heard. Pulmonary:     Effort: Pulmonary effort is normal. No respiratory distress.     Breath sounds:  Normal breath sounds. No wheezing, rhonchi or rales.  Chest:     Chest wall: No tenderness.  Abdominal:     General: Abdomen is flat.     Palpations: Abdomen is soft.     Tenderness: There is no abdominal tenderness. There is no right CVA tenderness, left CVA tenderness, guarding or rebound.  Musculoskeletal:        General: No swelling or tenderness.     Cervical back: Neck supple. No tenderness.     Right lower leg: No edema.     Left lower leg: No edema.  Skin:    General: Skin is warm and dry.     Capillary Refill: Capillary refill takes less than 2 seconds.     Findings: No erythema or rash.  Neurological:     General: No focal deficit present.     Mental Status: She is alert.     Sensory: No sensory deficit.     Motor: No weakness.  Psychiatric:        Mood and Affect: Mood normal.     ED Results / Procedures / Treatments   Labs (all labs ordered are listed, but only abnormal results are displayed) Labs Reviewed  HCG, QUANTITATIVE, PREGNANCY - Abnormal; Notable for the following components:      Result Value   hCG, Beta Chain, Quant, S 1,978 (*)    All other components within normal limits  URINALYSIS, W/ REFLEX TO CULTURE (INFECTION SUSPECTED) - Abnormal; Notable for the following components:   Color, Urine COLORLESS (*)    APPearance HAZY (*)    Specific Gravity, Urine <1.005 (*)    Bacteria, UA RARE (*)    All other components within normal limits  URINE CULTURE  CBC WITH DIFFERENTIAL/PLATELET  COMPREHENSIVE METABOLIC PANEL WITH GFR  LIPASE, BLOOD    EKG None  Radiology US  OB Transvaginal Result Date: 02/13/2024 CLINICAL DATA:  91320 Back pain 02725 EXAM: OBSTETRIC <14 WK ULTRASOUND TECHNIQUE: Transabdominal ultrasound was performed for evaluation of the gestation as well as the maternal uterus and adnexal regions. COMPARISON:  Feb 08, 2024 FINDINGS: Intrauterine gestational sac: Single Yolk sac:  Present Fetal pole: Present, 2.3 mm Cardiac Activity: Not  CRL:  2.4 mm 5 w 6 d                  US  EDC: 10/09/2024 Subchorionic hemorrhage:  None visualized. Maternal uterus/adnexae: Ovoid echogenicity in the left ovary measuring 1.6 cm, possibly the developing corpus luteum. No free pelvic fluid. IMPRESSION: Single intrauterine gestation with a yolk sac and fetal pole present (estimated gestational age of [redacted] weeks, 6 days). No fetal heartbeat visualized at this time, likely due to the earliness of the gestation. Close obstetric follow-up with a repeat first trimester pregnancy ultrasound in 14 days is recommended to assess for progression of the pregnancy. Electronically Signed   By: Rance Burrows M.D.   On: 02/13/2024 08:48  Procedures Procedures    Medications Ordered in ED Medications - No data to display  ED Course/ Medical Decision Making/ A&P                                 Medical Decision Making Amount and/or Complexity of Data Reviewed Labs: ordered. Radiology: ordered.    Elizabeth Ray is a 23 y.o. female with a past medical history significant for anxiety, ADHD, depression, bipolar disorder, seizures, and early pregnancy who presents with transient back discomfort abdominal discomfort, and some dysuria.  Cording to patient, yesterday she had some cramping across her lower abdomen that has resolved.  She reports she got into an argument with her spouse that was verbal and she was yelling and then started having a knot like balloon feeling pain in her right abdomen.  She reports it was brief and then went away but then she started having some soreness across her back as well.  She reports some dysuria for the last few days but denies any fevers or chills.  Denies any chest pain or shortness of breath otherwise.  She reports some tingling across her breast that also resolved after the verbal altercation.  She is worried she may have a miscarriage although she denies any persistent lower abdominal pain, vaginal discharge or vaginal  bleeding.  On my exam, lungs clear.  Chest nontender.  Abdomen nontender.  Good bowel sounds.  Back and flanks nontender.  No CVA tenderness.  Intact sensation strength and pulses in extremities.  Patient otherwise resting comfortably now.  Reassuring vital signs initially.  We had a shared decision-making conversation given her concern for the lower abdominal discomfort and her concern for miscarriage, we will get a pelvic ultrasound.  We will get screening labs including a urinalysis given her dysuria.  Will get other screening abdominal labs as well.  Given her verbal altercation and yelling, she could have had a muscle spasm or some other musculoskeletal pain causing the discomfort versus pain and tingling related to her early pregnancy and body changes.  Anticipate reassessment after workup to determine disposition.  10:17 AM Workup returned reassuring thus far.  Patient ultrasound shows single intrauterine gestation with a yolk sac and now a fetal pole but no heartbeat.  This is likely because it is too early however it was recommend she follow-up with OB repeat ultrasound in about 2 weeks for reassessment.  Patient agrees with this plan.  Otherwise her urine did not show clear evidence of infection and labs reassuring.  Patient does agree that she is very sensitive because she had miscarriage earlier this year and is worried about that.  Patient agrees with plans for discharge home and outpatient OB follow-up.  She had no other questions or concerns and was discharged in good condition.         Final Clinical Impression(s) / ED Diagnoses Final diagnoses:  Less than [redacted] weeks gestation of pregnancy    Rx / DC Orders ED Discharge Orders     None       Clinical Impression: 1. Less than [redacted] weeks gestation of pregnancy     Disposition: Discharge  Condition: Good  I have discussed the results, Dx and Tx plan with the pt(& family if present). He/she/they expressed  understanding and agree(s) with the plan. Discharge instructions discussed at great length. Strict return precautions discussed and pt &/or family have verbalized understanding of the instructions.  No further questions at time of discharge.    New Prescriptions   No medications on file    Follow Up: Everlina Hock, NP [address] [place] [address] [phone]     your [facility] for [facility] at [facility] [address] [place]  [address] [phone] Call        Latasha Puskas, Marine Sia, MD 02/13/24 1020

## 2024-02-13 NOTE — Telephone Encounter (Signed)
 Patient called stating she was seen in the ED after having an argument with her husband. She states she would like to come in to be seen. After reviewing her US , informed patient that she will need another US  in 14 days. Understanding was voiced. Vishal Sandlin l Azai Gaffin, CMA

## 2024-02-14 ENCOUNTER — Ambulatory Visit: Admitting: Family Medicine

## 2024-02-14 ENCOUNTER — Encounter (HOSPITAL_BASED_OUTPATIENT_CLINIC_OR_DEPARTMENT_OTHER): Payer: Self-pay

## 2024-02-14 ENCOUNTER — Other Ambulatory Visit: Payer: Self-pay

## 2024-02-14 ENCOUNTER — Emergency Department (HOSPITAL_BASED_OUTPATIENT_CLINIC_OR_DEPARTMENT_OTHER)
Admission: EM | Admit: 2024-02-14 | Discharge: 2024-02-14 | Discharge status: Against Medical Advice | Attending: Emergency Medicine | Admitting: Emergency Medicine

## 2024-02-14 DIAGNOSIS — Z5321 Procedure and treatment not carried out due to patient leaving prior to being seen by health care provider: Secondary | ICD-10-CM | POA: Diagnosis not present

## 2024-02-14 DIAGNOSIS — R799 Abnormal finding of blood chemistry, unspecified: Secondary | ICD-10-CM | POA: Diagnosis not present

## 2024-02-14 LAB — URINE CULTURE

## 2024-02-14 LAB — URINALYSIS, ROUTINE W REFLEX MICROSCOPIC
Bilirubin Urine: NEGATIVE
Glucose, UA: NEGATIVE mg/dL
Hgb urine dipstick: NEGATIVE
Ketones, ur: NEGATIVE mg/dL
Leukocytes,Ua: NEGATIVE
Nitrite: NEGATIVE
Protein, ur: NEGATIVE mg/dL
Specific Gravity, Urine: 1.007 (ref 1.005–1.030)
pH: 6.5 (ref 5.0–8.0)

## 2024-02-14 NOTE — Progress Notes (Signed)
 Patient here to pick up lab requisition.  Sent to lab.  Elizabeth Ray

## 2024-02-14 NOTE — ED Triage Notes (Signed)
 Pt advises she was called w results of urinalysis. Advises she's also concern for progression of pregnancy, requests recheck of HCG- [redacted]w[redacted]d

## 2024-02-15 ENCOUNTER — Encounter (HOSPITAL_BASED_OUTPATIENT_CLINIC_OR_DEPARTMENT_OTHER): Payer: Self-pay

## 2024-02-15 ENCOUNTER — Emergency Department (HOSPITAL_BASED_OUTPATIENT_CLINIC_OR_DEPARTMENT_OTHER)
Admission: EM | Admit: 2024-02-15 | Discharge: 2024-02-15 | Disposition: A | Discharge status: Home / Self Care | Attending: Emergency Medicine | Admitting: Emergency Medicine

## 2024-02-15 DIAGNOSIS — N898 Other specified noninflammatory disorders of vagina: Secondary | ICD-10-CM | POA: Diagnosis not present

## 2024-02-15 DIAGNOSIS — R300 Dysuria: Secondary | ICD-10-CM | POA: Diagnosis not present

## 2024-02-15 DIAGNOSIS — Z87891 Personal history of nicotine dependence: Secondary | ICD-10-CM | POA: Insufficient documentation

## 2024-02-15 LAB — URINALYSIS, ROUTINE W REFLEX MICROSCOPIC
Bilirubin Urine: NEGATIVE
Glucose, UA: NEGATIVE mg/dL
Hgb urine dipstick: NEGATIVE
Ketones, ur: NEGATIVE mg/dL
Leukocytes,Ua: NEGATIVE
Nitrite: NEGATIVE
Protein, ur: NEGATIVE mg/dL
Specific Gravity, Urine: 1.021 (ref 1.005–1.030)
pH: 6.5 (ref 5.0–8.0)

## 2024-02-15 LAB — WET PREP, GENITAL
Clue Cells Wet Prep HPF POC: NONE SEEN
Sperm: NONE SEEN
Trich, Wet Prep: NONE SEEN
WBC, Wet Prep HPF POC: <10 (ref <10)
Yeast Wet Prep HPF POC: NONE SEEN

## 2024-02-15 LAB — HCG, QUANTITATIVE, PREGNANCY: hCG, Beta Chain, Quant, S: 2321 m[IU]/mL — ABNORMAL HIGH (ref <5)

## 2024-02-15 NOTE — ED Notes (Addendum)
 Pt requesting for hCG blood level check. NAD.

## 2024-02-15 NOTE — ED Provider Notes (Signed)
 [place] EMERGENCY DEPARTMENT AT [facility] Provider Note   CSN: [id number] Arrival date & time: 02/15/24  1631     History  Chief Complaint  Patient presents with   Vaginal Discharge    Elizabeth Ray is a 23 y.o. female.   Vaginal Discharge   23 year old female presents emergency department with complaints of dysuria, malodorous vaginal discharge.  States she is even having burning when urinating for the past several days.  Had UA reassuring 11 days ago, a week ago, 2 days ago.  UA was cultured 2 days ago and patient was told by lab to come back due to multiple species present in urine culture for repeat UA.  Patient still endorses burning when urinating.  Also describes malodorous white vaginal discharge that has been present for the past couple of days.  States she is not concerned about STDS.  Denies any fevers, chills, change in bowel habits.  Does report lower abdominal pain which she has had throughout her current pregnancy has been somewhat intermittent in nature.  Not currently describing pain.  Last menstrual period January 07, 2024  Past medical history significant for bipolar disorder, ADHD, depression, seizure  Home Medications Prior to Admission medications   Medication Sig Start Date End Date Taking? Authorizing Provider  benzonatate  (TESSALON ) 100 MG capsule Take 1 capsule (100 mg total) by mouth every 8 (eight) hours. Patient not taking: Reported on 11/29/2023 10/30/23   Albertus Hughs, DO  Boric Acid Vaginal 600 MG SUPP Place 600 mg vaginally at bedtime. Patient not taking: Reported on 11/29/2023 11/07/23   Minna Amass B, NP  cefadroxil  (DURICEF) 500 MG capsule Take 1 capsule (500 mg total) by mouth 2 (two) times daily. 01/10/24   [place] Butter, PA  doxycycline  (VIBRAMYCIN ) 100 MG capsule Take 1 capsule (100 mg total) by mouth 2 (two) times daily. 12/13/23   Curatolo, Adam, DO  levETIRAcetam  (KEPPRA ) 500 MG tablet Take 1 tablet (500 mg total) by mouth 2 (two)  times daily. 11/27/23 11/21/24  Hartland, Amadou, MD  ondansetron  (ZOFRAN -ODT) 4 MG disintegrating tablet 4mg  ODT q4 hours prn nausea/vomit Patient not taking: Reported on 11/29/2023 10/30/23   Floyd, Dan, DO  rizatriptan  (MAXALT ) 5 MG tablet Take 1 tablet (5 mg total) by mouth as needed for migraine. May repeat in 2 hours if needed Patient not taking: Reported on 11/29/2023 01/24/23   Everlina Hock, NP      Allergies    Patient has no known allergies.    Review of Systems   Review of Systems  Genitourinary:  Positive for vaginal discharge.  All other systems reviewed and are negative.   Physical Exam Updated Vital Signs BP (!) 112/57 (BP Location: Right Arm)   Pulse 95   Temp 98.6 F (37 C)   Resp 18   LMP 11/13/2023 (Exact Date)   SpO2 98%  Physical Exam Vitals and nursing note reviewed.  Constitutional:      General: She is not in acute distress.    Appearance: She is well-developed.  HENT:     Head: Normocephalic and atraumatic.  Eyes:     Conjunctiva/sclera: Conjunctivae normal.  Cardiovascular:     Rate and Rhythm: Normal rate and regular rhythm.     Heart sounds: No murmur heard. Pulmonary:     Effort: Pulmonary effort is normal. No respiratory distress.     Breath sounds: Normal breath sounds.  Abdominal:     Palpations: Abdomen is soft.  Tenderness: There is no abdominal tenderness. There is no right CVA tenderness, left CVA tenderness or guarding.  Musculoskeletal:        General: No swelling.     Cervical back: Neck supple.  Skin:    General: Skin is warm and dry.     Capillary Refill: Capillary refill takes less than 2 seconds.  Neurological:     Mental Status: She is alert.  Psychiatric:        Mood and Affect: Mood normal.     ED Results / Procedures / Treatments   Labs (all labs ordered are listed, but only abnormal results are displayed) Labs Reviewed - No data to display  EKG None  Radiology No results found.  Procedures Procedures     Medications Ordered in ED Medications - No data to display  ED Course/ Medical Decision Making/ A&P                                 Medical Decision Making Amount and/or Complexity of Data Reviewed Labs: ordered.   This patient presents to the ED for concern of vaginal discharge, dysuria, this involves an extensive number of treatment options, and is a complaint that carries with it a high risk of complications and morbidity.  The differential diagnosis includes GC, chlamydia, BV and trichomoniasis, vulvovaginal candidiasis, UTI, pyelo-, other   Co morbidities that complicate the patient evaluation  See HPI   Additional history obtained:  Additional history obtained from EMR External records from outside source obtained and reviewed including hospital records   Lab Tests:  I Ordered, and personally interpreted labs.  The pertinent results include: Wet prep negative.  HCG pending.  UA without abnormality   Imaging Studies ordered:  N/a   Cardiac Monitoring: / EKG:  N/a   Consultations Obtained:  N/a   Problem List / ED Course / Critical interventions / Medication management  Vaginal discharge, dysuria Reevaluation of the patient showed that the patient stayed the same I have reviewed the patients home medicines and have made adjustments as needed   Social Determinants of Health:  Former cigarette use.  Denies illicit drug use.   Test / Admission - Considered:  Vaginal discharge, dysuria Vitals signs within normal range and stable throughout visit. Laboratory studies significant for: See above 23 year old female presents emergency department with complaints of dysuria, malodorous vaginal discharge.  States she is even having burning when urinating for the past several days.  Had UA reassuring 11 days ago, a week ago, 2 days ago.  UA was cultured 2 days ago and patient was told by lab to come back due to multiple species present in urine culture for  repeat UA.  Patient still endorses burning when urinating.  Also describes malodorous white vaginal discharge that has been present for the past couple of days.  States she is not concerned about STDS.  Denies any fevers, chills, change in bowel habits.  Does report lower abdominal pain which she has had throughout her current pregnancy has been somewhat intermittent in nature.  Not currently describing pain. On exam, abdomen nontender.  Lungs clear to auscultation bilaterally.  Wet prep negative.  UA without obvious infection.  Pending GC/chlamydia.  Will recommend follow-up with OB/GYN for further assessment regarding patient's symptoms as well as continued monitoring of patient's pregnancy.  Treatment plan discussed with patient and she acknowledged understanding was agreeable to said plan.  Patient overall  well-appearing, afebrile in no acute distress. Worrisome signs and symptoms were discussed with the patient, and the patient acknowledged understanding to return to the ED if noticed. Patient was stable upon discharge.          Final Clinical Impression(s) / ED Diagnoses Final diagnoses:  None    Rx / DC Orders ED Discharge Orders     None         Hollins Butter, [place] 02/15/24 1842    Afton Horse T, DO 02/16/24 1451

## 2024-02-15 NOTE — Discharge Instructions (Signed)
 Your wet prep was negative.  Your urine did not look infected.  Follow MyChart for the results of your GC/chlamydia testing as well as your hCG levels.  Recommend follow-up with your OB/GYN for continued assessment regarding this pregnancy.

## 2024-02-15 NOTE — ED Triage Notes (Addendum)
 Pt requesting HCG check, advises she has malodorous discharge onset this AM. Seen for same yesterday, LWBS after triage

## 2024-02-16 ENCOUNTER — Other Ambulatory Visit: Payer: Self-pay

## 2024-02-16 ENCOUNTER — Emergency Department (HOSPITAL_BASED_OUTPATIENT_CLINIC_OR_DEPARTMENT_OTHER)
Admission: EM | Admit: 2024-02-16 | Discharge: 2024-02-16 | Disposition: A | Discharge status: Home / Self Care | Attending: Emergency Medicine | Admitting: Emergency Medicine

## 2024-02-16 ENCOUNTER — Encounter (HOSPITAL_BASED_OUTPATIENT_CLINIC_OR_DEPARTMENT_OTHER): Payer: Self-pay | Admitting: Emergency Medicine

## 2024-02-16 DIAGNOSIS — O99711 Diseases of the skin and subcutaneous tissue complicating pregnancy, first trimester: Secondary | ICD-10-CM | POA: Insufficient documentation

## 2024-02-16 DIAGNOSIS — L723 Sebaceous cyst: Secondary | ICD-10-CM | POA: Diagnosis not present

## 2024-02-16 DIAGNOSIS — Z3A01 Less than 8 weeks gestation of pregnancy: Secondary | ICD-10-CM | POA: Diagnosis not present

## 2024-02-16 DIAGNOSIS — L729 Follicular cyst of the skin and subcutaneous tissue, unspecified: Secondary | ICD-10-CM

## 2024-02-16 DIAGNOSIS — D2230 Melanocytic nevi of unspecified part of face: Secondary | ICD-10-CM | POA: Diagnosis not present

## 2024-02-16 MED ORDER — CEPHALEXIN 500 MG PO CAPS: 500.0000 mg | ORAL_CAPSULE | Freq: Four times a day (QID) | ORAL | 0 refills | Status: DC

## 2024-02-16 NOTE — ED Provider Notes (Signed)
  EMERGENCY DEPARTMENT AT [facility] Provider Note   CSN: [id number] Arrival date & time: 02/16/24  1803     History  Chief Complaint  Patient presents with   Skin Problem    Elizabeth Ray is a 23 y.o. female.  Patient to ED for evaluation of facial changes described as multiple areas of pimple like rash. Two of concern are associated with moles on the chin and the right maxilla that are longstanding but now have swelling and soreness. No drainage. Patient is approximately [redacted] weeks pregnant.   The history is provided by the patient. No language interpreter was used.       Home Medications Prior to Admission medications   Medication Sig Start Date End Date Taking? Authorizing Provider  cephALEXin  (KEFLEX ) 500 MG capsule Take 1 capsule (500 mg total) by mouth 4 (four) times daily. 02/16/24  Yes Burdell Peed, Clovis Dar, PA-C  benzonatate  (TESSALON ) 100 MG capsule Take 1 capsule (100 mg total) by mouth every 8 (eight) hours. Patient not taking: Reported on 11/29/2023 10/30/23   Albertus Hughs, DO  Boric Acid Vaginal 600 MG SUPP Place 600 mg vaginally at bedtime. Patient not taking: Reported on 11/29/2023 11/07/23   Minna Amass B, NP  cefadroxil  (DURICEF) 500 MG capsule Take 1 capsule (500 mg total) by mouth 2 (two) times daily. 01/10/24   [place] Butter, PA  doxycycline  (VIBRAMYCIN ) 100 MG capsule Take 1 capsule (100 mg total) by mouth 2 (two) times daily. 12/13/23   Curatolo, Adam, DO  levETIRAcetam  (KEPPRA ) 500 MG tablet Take 1 tablet (500 mg total) by mouth 2 (two) times daily. 11/27/23 11/21/24  Tyler, Amadou, MD  ondansetron  (ZOFRAN -ODT) 4 MG disintegrating tablet 4mg  ODT q4 hours prn nausea/vomit Patient not taking: Reported on 11/29/2023 10/30/23   Floyd, Dan, DO  rizatriptan  (MAXALT ) 5 MG tablet Take 1 tablet (5 mg total) by mouth as needed for migraine. May repeat in 2 hours if needed Patient not taking: Reported on 11/29/2023 01/24/23   Everlina Hock, NP      Allergies     Patient has no known allergies.    Review of Systems   Review of Systems  Physical Exam Updated Vital Signs BP 113/72 (BP Location: Left Arm)   Pulse 97   Temp 98.1 F (36.7 C) (Oral)   Resp 18   Ht 5\' 9"  (1.753 m)   Wt 74.8 kg   LMP 11/13/2023 (Exact Date)   SpO2 100%   BMI 24.37 kg/m  Physical Exam Constitutional:      General: She is not in acute distress.    Appearance: She is well-developed. She is not ill-appearing.  Pulmonary:     Effort: Pulmonary effort is normal.  Musculoskeletal:        General: Normal range of motion.     Cervical back: Normal range of motion.  Skin:    General: Skin is warm and dry.     Comments: Multiple small singular bumps c/w pimples over face. Mole to right maxilla has adjacent such lesion without pustule. Mole to chin has a nodular, mildly tender swelling. No active drainage. No fluctuance.   Neurological:     Mental Status: She is alert and oriented to person, place, and time.     ED Results / Procedures / Treatments   Labs (all labs ordered are listed, but only abnormal results are displayed) Labs Reviewed - No data to display  EKG None  Radiology No results found.  Procedures  Procedures    Medications Ordered in ED Medications - No data to display  ED Course/ Medical Decision Making/ A&P Clinical Course as of 02/16/24 1842  Sat Feb 16, 2024  1610 Patient in 1st trimester of pregnancy presents with facial acne, likely secondary to hormonal changes. Two such areas are associated with moles that are longstanding, one has a likely early cyst/abscess. Discussed dermatology follow up as there is a need for evaluation of any mole with changes. Will start Keflex , do warm compresses, dermatology follow up for recheck and examination of moles.  [SU]    Clinical Course User Index [SU] Mandy Second, PA-C                                 Medical Decision Making          Final Clinical Impression(s) / ED  Diagnoses Final diagnoses:  Cutaneous cyst  Change in facial mole    Rx / DC Orders ED Discharge Orders          Ordered    cephALEXin  (KEFLEX ) 500 MG capsule  4 times daily        02/16/24 1842              Mandy Second, PA-C 02/16/24 1843

## 2024-02-16 NOTE — Discharge Instructions (Signed)
 Take Keflex  as prescribed. Apply warm compresses to the larger cyst on the chin.  As we discussed, anytime a mole changes in character, a dermatology examination is warranted. Please call to make an appointment for re-exam of the facial moles and for recheck of the cyst.

## 2024-02-16 NOTE — ED Triage Notes (Signed)
 Pt reports 2 moles on face are now "crusty" and painful

## 2024-02-18 LAB — GC/CHLAMYDIA PROBE AMP (~~LOC~~) NOT AT ARMC
Chlamydia: NEGATIVE
Comment: NEGATIVE
Comment: NORMAL
Neisseria Gonorrhea: NEGATIVE

## 2024-02-19 ENCOUNTER — Other Ambulatory Visit

## 2024-02-19 DIAGNOSIS — G40309 Generalized idiopathic epilepsy and epileptic syndromes, not intractable, without status epilepticus: Secondary | ICD-10-CM

## 2024-02-19 DIAGNOSIS — Z5181 Encounter for therapeutic drug level monitoring: Secondary | ICD-10-CM

## 2024-02-20 ENCOUNTER — Encounter (HOSPITAL_BASED_OUTPATIENT_CLINIC_OR_DEPARTMENT_OTHER): Payer: Self-pay

## 2024-02-20 ENCOUNTER — Emergency Department (HOSPITAL_BASED_OUTPATIENT_CLINIC_OR_DEPARTMENT_OTHER)

## 2024-02-20 ENCOUNTER — Ambulatory Visit: Payer: Self-pay | Admitting: Neurology

## 2024-02-20 ENCOUNTER — Other Ambulatory Visit: Payer: Self-pay

## 2024-02-20 ENCOUNTER — Emergency Department (HOSPITAL_BASED_OUTPATIENT_CLINIC_OR_DEPARTMENT_OTHER): Admission: EM | Admit: 2024-02-20 | Discharge: 2024-02-20 | Disposition: A | Discharge status: Home / Self Care

## 2024-02-20 DIAGNOSIS — O26891 Other specified pregnancy related conditions, first trimester: Secondary | ICD-10-CM | POA: Insufficient documentation

## 2024-02-20 DIAGNOSIS — G40309 Generalized idiopathic epilepsy and epileptic syndromes, not intractable, without status epilepticus: Secondary | ICD-10-CM

## 2024-02-20 DIAGNOSIS — O3481 Maternal care for other abnormalities of pelvic organs, first trimester: Secondary | ICD-10-CM | POA: Diagnosis not present

## 2024-02-20 DIAGNOSIS — Z3A01 Less than 8 weeks gestation of pregnancy: Secondary | ICD-10-CM | POA: Diagnosis not present

## 2024-02-20 DIAGNOSIS — R1032 Left lower quadrant pain: Secondary | ICD-10-CM | POA: Insufficient documentation

## 2024-02-20 DIAGNOSIS — N8312 Corpus luteum cyst of left ovary: Secondary | ICD-10-CM | POA: Diagnosis not present

## 2024-02-20 DIAGNOSIS — Z3A00 Weeks of gestation of pregnancy not specified: Secondary | ICD-10-CM | POA: Diagnosis not present

## 2024-02-20 LAB — COMPREHENSIVE METABOLIC PANEL WITH GFR
ALT: 32 U/L (ref 0–44)
AST: 29 U/L (ref 15–41)
Albumin: 4.2 g/dL (ref 3.5–5.0)
Alkaline Phosphatase: 100 U/L (ref 38–126)
Anion gap: 12 (ref 5–15)
BUN: 9 mg/dL (ref 6–20)
CO2: 23 mmol/L (ref 22–32)
Calcium: 10.1 mg/dL (ref 8.9–10.3)
Chloride: 101 mmol/L (ref 98–111)
Creatinine, Ser: 0.69 mg/dL (ref 0.44–1.00)
GFR, Estimated: >60 mL/min (ref >60)
Glucose, Bld: 93 mg/dL (ref 70–99)
Potassium: 4.3 mmol/L (ref 3.5–5.1)
Sodium: 136 mmol/L (ref 135–145)
Total Bilirubin: 0.3 mg/dL (ref 0.0–1.2)
Total Protein: 7.2 g/dL (ref 6.5–8.1)

## 2024-02-20 LAB — CBC WITH DIFFERENTIAL/PLATELET
Abs Immature Granulocytes: 0.02 10*3/uL (ref 0.00–0.07)
Basophils Absolute: 0 10*3/uL (ref 0.0–0.1)
Basophils Relative: 1 %
Eosinophils Absolute: 0.1 10*3/uL (ref 0.0–0.5)
Eosinophils Relative: 2 %
HCT: 40.3 % (ref 36.0–46.0)
Hemoglobin: 13.4 g/dL (ref 12.0–15.0)
Immature Granulocytes: 0 %
Lymphocytes Relative: 21 %
Lymphs Abs: 1.7 10*3/uL (ref 0.7–4.0)
MCH: 29.5 pg (ref 26.0–34.0)
MCHC: 33.3 g/dL (ref 30.0–36.0)
MCV: 88.8 fL (ref 80.0–100.0)
Monocytes Absolute: 0.5 10*3/uL (ref 0.1–1.0)
Monocytes Relative: 6 %
Neutro Abs: 5.9 10*3/uL (ref 1.7–7.7)
Neutrophils Relative %: 70 %
Platelets: 274 10*3/uL (ref 150–400)
RBC: 4.54 MIL/uL (ref 3.87–5.11)
RDW: 12.3 % (ref 11.5–15.5)
WBC: 8.3 10*3/uL (ref 4.0–10.5)
nRBC: 0 % (ref 0.0–0.2)

## 2024-02-20 LAB — HCG, QUANTITATIVE, PREGNANCY: hCG, Beta Chain, Quant, S: 2467 m[IU]/mL — ABNORMAL HIGH (ref <5)

## 2024-02-20 LAB — WET PREP, GENITAL
Clue Cells Wet Prep HPF POC: NONE SEEN
Sperm: NONE SEEN
Trich, Wet Prep: NONE SEEN
WBC, Wet Prep HPF POC: <10 (ref <10)
Yeast Wet Prep HPF POC: NONE SEEN

## 2024-02-20 LAB — URINALYSIS, ROUTINE W REFLEX MICROSCOPIC
Bacteria, UA: NONE SEEN
Bilirubin Urine: NEGATIVE
Glucose, UA: NEGATIVE mg/dL
Hgb urine dipstick: NEGATIVE
Ketones, ur: NEGATIVE mg/dL
Leukocytes,Ua: NEGATIVE
Nitrite: NEGATIVE
Protein, ur: NEGATIVE mg/dL
Specific Gravity, Urine: 1.005 (ref 1.005–1.030)
pH: 7 (ref 5.0–8.0)

## 2024-02-20 LAB — LIPASE, BLOOD: Lipase: 30 U/L (ref 11–51)

## 2024-02-20 LAB — LEVETIRACETAM LEVEL: Levetiracetam Lvl: 2 ug/mL — ABNORMAL LOW (ref 10.0–40.0)

## 2024-02-20 NOTE — Discharge Instructions (Signed)
 You were seen today for left lower quad abdominal pain.  Suspect this is most likely from your corpus luteum noted to be on your left ovary.  Your labs today and imaging were reassuring that I have low suspicion for any emergent cause of your symptoms today.  Recommend you continue follow-up with your OB/GYN visit already scheduled.  And please return to the ED for any new or worsening symptoms which include vaginal bleeding, chest pain, shortness of breath, headache with vomiting and blurry vision, lower leg swelling, increased abdominal pain.

## 2024-02-20 NOTE — ED Triage Notes (Signed)
 Pt c/o continued abd pain, no change from being seen 6/5, 6/6. L side, no bleeding, no urinary complaints. Softer stool

## 2024-02-20 NOTE — ED Provider Notes (Signed)
 [place] EMERGENCY DEPARTMENT AT [facility] Provider Note   CSN: [id number] Arrival date & time: 02/20/24  1049     History  Chief Complaint  Patient presents with   Abdominal Pain    Elizabeth Ray is a 23 y.o. female.   Abdominal Pain Patient is a 23 year old female G2, P0 approximately [redacted] weeks gestation presents ED today with complaints of left lower quad abdominal pain that began suddenly last night described as sharp.  States that she felt this pain previously when she was seen in the ED approximately 2 weeks ago.  Pain has been persistent without any periods of abating.  Previous medical history of bipolar, anxiety, ADHD, seizures.  States that symptoms have been present with mild nausea without vomiting as well as with increased, yeasty or BV smelling discharge, noting that she gets BV and yeast infections quite frequently.  Noted to have been recently tested and was negative however thinks that she may still have 1 today.  Also noting some painful urination and hot urination which is different from her normal.  Otherwise no other symptoms at this time.  LMP was 01/07/2024.  Notes that she was previously prescribed Keflex  but has not picked it up from the pharmacy yet.  Noted to have OB/GYN follow-up visit on the June 18.  Denies fever, headache, vision changes, chest pain, shortness of breath, vomiting, diarrhea, hematochezia, melena, hematuria, vaginal bleeding, lower leg swelling.      Home Medications Prior to Admission medications   Medication Sig Start Date End Date Taking? Authorizing Provider  benzonatate  (TESSALON ) 100 MG capsule Take 1 capsule (100 mg total) by mouth every 8 (eight) hours. Patient not taking: Reported on 11/29/2023 10/30/23   Albertus Hughs, DO  Boric Acid Vaginal 600 MG SUPP Place 600 mg vaginally at bedtime. Patient not taking: Reported on 11/29/2023 11/07/23   Minna Amass B, NP  cefadroxil  (DURICEF) 500 MG capsule Take 1 capsule (500  mg total) by mouth 2 (two) times daily. 01/10/24   Domino Butter, PA  cephALEXin  (KEFLEX ) 500 MG capsule Take 1 capsule (500 mg total) by mouth 4 (four) times daily. 02/16/24   Mandy Second, PA-C  doxycycline  (VIBRAMYCIN ) 100 MG capsule Take 1 capsule (100 mg total) by mouth 2 (two) times daily. 12/13/23   Curatolo, Adam, DO  levETIRAcetam  (KEPPRA ) 500 MG tablet Take 1 tablet (500 mg total) by mouth 2 (two) times daily. 11/27/23 11/21/24  Ridley, Amadou, MD  ondansetron  (ZOFRAN -ODT) 4 MG disintegrating tablet 4mg  ODT q4 hours prn nausea/vomit Patient not taking: Reported on 11/29/2023 10/30/23   Albertus Hughs, DO  rizatriptan  (MAXALT ) 5 MG tablet Take 1 tablet (5 mg total) by mouth as needed for migraine. May repeat in 2 hours if needed Patient not taking: Reported on 11/29/2023 01/24/23   Everlina Hock, NP      Allergies    Patient has no known allergies.    Review of Systems   Review of Systems  Gastrointestinal:  Positive for abdominal pain.  All other systems reviewed and are negative.   Physical Exam Updated Vital Signs BP (!) 108/59   Pulse 75   Temp 98 F (36.7 C)   Resp 17   LMP 11/13/2023 (Exact Date)   SpO2 100%  Physical Exam Vitals and nursing note reviewed.  Constitutional:      General: She is not in acute distress.    Appearance: Normal appearance. She is well-developed. She is not ill-appearing.  HENT:  Head: Normocephalic and atraumatic.     Mouth/Throat:     Mouth: Mucous membranes are moist.     Pharynx: Oropharynx is clear. No pharyngeal swelling or oropharyngeal exudate.  Eyes:     General: No scleral icterus.       Right eye: No discharge.        Left eye: No discharge.     Extraocular Movements: Extraocular movements intact.     Conjunctiva/sclera: Conjunctivae normal.  Cardiovascular:     Rate and Rhythm: Normal rate and regular rhythm.     Pulses: Normal pulses.     Heart sounds: Normal heart sounds. No murmur heard.    No friction rub. No gallop.   Pulmonary:     Effort: Pulmonary effort is normal. No respiratory distress.     Breath sounds: Normal breath sounds. No stridor. No wheezing, rhonchi or rales.  Chest:     Chest wall: No tenderness.  Abdominal:     General: Abdomen is flat. There is no distension. There are no signs of injury.     Palpations: Abdomen is soft. There is no pulsatile mass.     Tenderness: There is abdominal tenderness in the left lower quadrant. There is no right CVA tenderness, left CVA tenderness, guarding or rebound. Negative signs include Murphy's sign, McBurney's sign and obturator sign.  Musculoskeletal:     Cervical back: Normal range of motion and neck supple. No rigidity.     Right lower leg: No edema.     Left lower leg: No edema.  Skin:    General: Skin is warm and dry.     Coloration: Skin is not jaundiced, mottled or pale.     Findings: No erythema.  Neurological:     General: No focal deficit present.     Mental Status: She is alert and oriented to person, place, and time. Mental status is at baseline.     Cranial Nerves: No cranial nerve deficit.     Sensory: No sensory deficit.     Motor: No weakness.  Psychiatric:        Mood and Affect: Mood normal. Mood is not anxious.     ED Results / Procedures / Treatments   Labs (all labs ordered are listed, but only abnormal results are displayed) Labs Reviewed  URINALYSIS, ROUTINE W REFLEX MICROSCOPIC - Abnormal; Notable for the following components:      Result Value   Color, Urine COLORLESS (*)    All other components within normal limits  HCG, QUANTITATIVE, PREGNANCY - Abnormal; Notable for the following components:   hCG, Beta Chain, Quant, S 2,467 (*)    All other components within normal limits  WET PREP, GENITAL  CBC WITH DIFFERENTIAL/PLATELET  COMPREHENSIVE METABOLIC PANEL WITH GFR  LIPASE, BLOOD    EKG None  Radiology US  OB Transvaginal Result Date: 02/20/2024 CLINICAL DATA:  Pelvic pain. EXAM: TRANSVAGINAL OB  ULTRASOUND TECHNIQUE: Transvaginal ultrasound was performed for complete evaluation of the gestation as well as the maternal uterus, adnexal regions, and pelvic cul-de-sac. COMPARISON:  February 13, 2024 FINDINGS: Intrauterine gestational sac: Single Yolk sac:  Visualized. Embryo:  Visualized. Cardiac Activity: Visualized. Heart Rate: N/A bpm (crown-rump length too small to measure) CRL: 2.9 mm (measured 2.4 mm on and February 13, 2024) 5 w 6 d US  EDC: October 16, 2024 Subchorionic hemorrhage:  None visualized. Maternal uterus/adnexae: The right ovary measures 2.4 cm x 1.4 cm x 1.3 cm and is normal in appearance. The left ovary measures  3.4 cm x 1.7 cm x 2.4 cm and contains a 1.6 cm corpus luteum cyst. IMPRESSION: Single intrauterine gestational sac, yolk sac and fetal pole, with no cardiac activity yet visualized. Recommend follow-up quantitative B-HCG levels and follow-up US  in 14 days to assess viability. This recommendation follows SRU consensus guidelines: Diagnostic Criteria for Nonviable Pregnancy Early in the First Trimester. Mel Spine Med 2013; 409:8119-14. Electronically Signed   By: Virgle Grime M.D.   On: 02/20/2024 13:49    Procedures Procedures    Medications Ordered in ED Medications - No data to display  ED Course/ Medical Decision Making/ A&P                                 Medical Decision Making  This patient is a 23 year old female who presents to the ED for concern of left lower quadrant abdominal pain, similar to previous ED visits.  Approximately [redacted] weeks gestation with IUP noted on ultrasound without fetal heartbeat.  With patient's persistent symptoms today, will obtain baseline labs and ultrasound.  On physical exam, patient is in no acute distress, afebrile, alert and orient x 4, speaking in full sentences, nontachypneic, nontachycardic.  Well-appearing female.  Notable left lower quadrant abdominal tenderness palpation.  No CVA tenderness bilaterally.  Unremarkable exam  otherwise.  Labs unremarkable and ultrasound reveals the corpus luteum on the left ovary, over where she is experiencing some pain.  Suspect this is the cause of her pain.  Fetal heart rate is still not visualized yet.  Likely due to it being too early.  Will have her continue to follow-up with OB/GYN.  Taking Tylenol  for any discomfort today.  Suspicion for any emergent causes for her symptoms today.  Will have her need to follow-up with OB/GYN.  Patient vital signs have remained stable throughout the course of patient's time in the ED. Low suspicion for any other emergent pathology at this time. I believe this patient is safe to be discharged. Provided strict return to ER precautions. Patient expressed agreement and understanding of plan. All questions were answered.   Differential diagnoses prior to evaluation: The emergent differential diagnosis includes, but is not limited to, ovarian torsion, ovarian cyst, ectopic pregnancy, endometriosis, diverticulitis, appendicitis, pancreatitis, UTI, nephrolithiasis. This is not an exhaustive differential.   Past Medical History / Co-morbidities / Social History: Anxiety, seizures, depression, bipolar, ADHD  Additional history: Chart reviewed. Pertinent results include:   Noted have been seen in the ED on 02/13/2024, noted to have gotten to a verbal shouting match with spouse and reporting abdominal soreness across her abdomen and back.  Noting on ultrasound to have a fetal pole but no heartbeat likely due to being too early.  Was to follow-up with OB/GYN for repeat ultrasound in 2 weeks for reassessment.  Labs were otherwise reassuring.  Seen again on 02/15/2024 for vaginal discharge.  Noting dysuria as well.  Noted to have a urine culture done 2 days ago and told to come back due to multiple species present in urine culture for repeat UA.  Discharged with no change in antibiotic regimen.  Seen again on 02/16/2024 in the ED for cutaneous small.  Noted to be  possibly early cyst or abscess.  Started on Keflex .  told to return to dermatology for follow-up.   Lab Tests/Imaging studies: I personally interpreted labs/imaging and the pertinent results include:   CBC unremarkable CMP unremarkable ECG rotative elevated 2467 UA  unremarkable Lipase unremarkable Lipase unremarkable Ultrasound reveals a intrauterine pregnancy with no fetal heart rate as might be too early to see at this time.  Corpus luteum noted on the left ovary .  I agree with the radiologist interpretation.  Medications: I have reviewed the patients home medicines and have made adjustments as needed.  Critical Interventions:  Social Determinants of Health:  Disposition: After consideration of the diagnostic results and the patients response to treatment, I feel that the patient would benefit from discharge and treatment as above with this.   emergency department workup does not suggest an emergent condition requiring admission or immediate intervention beyond what has been performed at this time. The plan is: Follow-up with OB/GYN, return to the ED for new or worsening symptoms, Tylenol  for any pain. The patient is safe for discharge and has been instructed to return immediately for worsening symptoms, change in symptoms or any other concerns.   Final Clinical Impression(s) / ED Diagnoses Final diagnoses:  Left lower quadrant abdominal pain    Rx / DC Orders ED Discharge Orders     None         Vevelyn Gowers 02/20/24 1417    Carin Charleston, MD 02/20/24 1458

## 2024-02-23 DIAGNOSIS — O30011 Twin pregnancy, monochorionic/monoamniotic, first trimester: Secondary | ICD-10-CM | POA: Diagnosis not present

## 2024-02-23 DIAGNOSIS — O26851 Spotting complicating pregnancy, first trimester: Secondary | ICD-10-CM | POA: Diagnosis not present

## 2024-02-23 DIAGNOSIS — Z3A01 Less than 8 weeks gestation of pregnancy: Secondary | ICD-10-CM | POA: Diagnosis not present

## 2024-02-23 DIAGNOSIS — Z6790 Unspecified blood type, Rh positive: Secondary | ICD-10-CM | POA: Diagnosis not present

## 2024-02-25 ENCOUNTER — Ambulatory Visit: Admitting: Clinical

## 2024-02-25 DIAGNOSIS — F4321 Adjustment disorder with depressed mood: Secondary | ICD-10-CM

## 2024-02-25 DIAGNOSIS — F4310 Post-traumatic stress disorder, unspecified: Secondary | ICD-10-CM

## 2024-02-25 DIAGNOSIS — Z658 Other specified problems related to psychosocial circumstances: Secondary | ICD-10-CM

## 2024-02-25 NOTE — Patient Instructions (Signed)
[facility] [address] [place], [place] [address] [phone] (main office) [phone] (Adelin Ventrella's office)  Options for Doula Care in the [place] Area  As you review your birthing options, consider having a birth doula. A doula is trained to provide support before, during and just after you give birth. There are also postpartum doulas that help you adjust to new parenthood.  While doulas do not provide medical care, they do provide emotional, physical and educational support. A few months before your baby arrives, doulas can help answer questions, ease concerns and help you create and support your birthing plan.    Doulas can help reduce your stress and comfort you and your partner. They can help you cope with labor by helping you use breathing techniques, massage, creative labor positioning, essential oils and affirmations.   Studies show that the benefits of having a doula include:   A more positive birth experience  Fewer requests for pain-relief medication  Less likelihood of cesarean section, commonly called a c-section   Doulas are typically hired via a fee and contract between you and the doula. We are happy to provide a list of the most active doulas in the area, all of whom are credentialed by [facility] and will not count as a visitor at your birth.  There are several options for no-cost doula care at our hospital, including:  [facility] Volunteer Doula Program Every Baby [place] Doula Program A Cure 4 Moms Doula Study (available only at [facility], [facility], [place] and [place] [facility] offices)  For more information on these programs or to receive a list of doulas active in our area, please email [email or web address]         

## 2024-02-25 NOTE — BH Specialist Note (Signed)
 Integrated Behavioral Health via Telemedicine Visit  02/25/2024 KEYAIRA CLAPHAM [id number]  Number of Integrated Behavioral Health Clinician visits: 3- Third Visit  Session Start time: [phone]   Session End time: 0908  Total time in minutes: 22  Referring Provider: Werner Hamlet, DO Patient/Family location: [facility] Provider location: [facility]  All persons participating in visit: Patient Gabby Rackers and [facility] Jennylee Uehara   Types of Service: Individual psychotherapy and Video visit  I connected with Nyelli R Quigg and/or Jentri R Byer's n/a via  Telephone or Video Enabled Telemedicine Application  (Video is Caregility application) and verified that I am speaking with the correct person using two identifiers. Discussed confidentiality: Yes   I discussed the limitations of telemedicine and the availability of in person appointments.  Discussed there is a possibility of technology failure and discussed alternative modes of communication if that failure occurs.  I discussed that engaging in this telemedicine visit, they consent to the provision of behavioral healthcare and the services will be billed under their insurance.  Patient and/or legal guardian expressed understanding and consented to Telemedicine visit: Yes   Presenting Concerns: Patient and/or family reports the following symptoms/concerns: Adjusting to news of twin pregnancy, along with the stress of waiting to hear back from a job offer with housing support; processing lack of maternal support during this time. Pt's husband is being somewhat supportive at this time.  Duration of problem: Ongoing with increased recent stressors; Severity of problem: moderately severe  Patient and/or Family's Strengths/Protective Factors: Sense of purpose  Goals Addressed: Patient will:  Reduce symptoms of: anxiety, depression, and stress   Increase knowledge and/or ability  of: stress reduction   Demonstrate ability to: Increase healthy adjustment to current life circumstances and Increase adequate support systems for patient/family  Progress towards Goals: Ongoing    Interventions: Interventions utilized:  Link to [employer] and Supportive Reflection Standardized Assessments completed: Not Needed    Patient and/or Family Response: Patient agrees with treatment plan.   Clinical Assessment/Diagnosis  PTSD (post-traumatic stress disorder)  Psychosocial stressors  Grief .   Patient may benefit from continued therapeutic intervention .  Plan: Follow up with behavioral health clinician on : Brief phone check-in tomorrow; follow up visit in two weeks Behavioral recommendations:  -Prioritize only things within your realm of control for the next 24 hours (self care in the form of adequate rest and meals; decrease communication with non-supportive people in life; increase time/communication with supportive people in life) -Read through information on After Visit Summary; use as needed and discussed  Referral(s): Integrated Art gallery manager (In Clinic) and Community Resources:  Doula  I discussed the assessment and treatment plan with the patient and/or parent/guardian. They were provided an opportunity to ask questions and all were answered. They agreed with the plan and demonstrated an understanding of the instructions.   They were advised to call back or seek an in-person evaluation if the symptoms worsen or if the condition fails to improve as anticipated.  Bryann Mcnealy C Bird Tailor, LCSW

## 2024-02-26 ENCOUNTER — Other Ambulatory Visit: Payer: Self-pay

## 2024-02-26 ENCOUNTER — Encounter (HOSPITAL_BASED_OUTPATIENT_CLINIC_OR_DEPARTMENT_OTHER): Payer: Self-pay | Admitting: Emergency Medicine

## 2024-02-26 ENCOUNTER — Emergency Department (HOSPITAL_BASED_OUTPATIENT_CLINIC_OR_DEPARTMENT_OTHER)

## 2024-02-26 ENCOUNTER — Telehealth: Payer: Self-pay | Admitting: Clinical

## 2024-02-26 ENCOUNTER — Other Ambulatory Visit (HOSPITAL_COMMUNITY)
Admission: RE | Admit: 2024-02-26 | Discharge: 2024-02-26 | Disposition: A | Discharge status: Home / Self Care | Source: Ambulatory Visit | Attending: Family Medicine | Admitting: Family Medicine

## 2024-02-26 ENCOUNTER — Ambulatory Visit

## 2024-02-26 ENCOUNTER — Emergency Department (HOSPITAL_BASED_OUTPATIENT_CLINIC_OR_DEPARTMENT_OTHER): Admission: EM | Admit: 2024-02-26 | Discharge: 2024-02-26 | Disposition: A | Discharge status: Home / Self Care

## 2024-02-26 VITALS — BP 123/73 | HR 88 | Ht 63.0 in | Wt 189.0 lb

## 2024-02-26 DIAGNOSIS — Z113 Encounter for screening for infections with a predominantly sexual mode of transmission: Secondary | ICD-10-CM | POA: Diagnosis not present

## 2024-02-26 DIAGNOSIS — R300 Dysuria: Secondary | ICD-10-CM | POA: Diagnosis not present

## 2024-02-26 DIAGNOSIS — R001 Bradycardia, unspecified: Secondary | ICD-10-CM | POA: Diagnosis not present

## 2024-02-26 DIAGNOSIS — O4690 Antepartum hemorrhage, unspecified, unspecified trimester: Secondary | ICD-10-CM

## 2024-02-26 DIAGNOSIS — O209 Hemorrhage in early pregnancy, unspecified: Secondary | ICD-10-CM | POA: Diagnosis not present

## 2024-02-26 DIAGNOSIS — O26891 Other specified pregnancy related conditions, first trimester: Secondary | ICD-10-CM | POA: Insufficient documentation

## 2024-02-26 DIAGNOSIS — R7401 Elevation of levels of liver transaminase levels: Secondary | ICD-10-CM | POA: Insufficient documentation

## 2024-02-26 DIAGNOSIS — N898 Other specified noninflammatory disorders of vagina: Secondary | ICD-10-CM

## 2024-02-26 DIAGNOSIS — Z3A01 Less than 8 weeks gestation of pregnancy: Secondary | ICD-10-CM | POA: Diagnosis not present

## 2024-02-26 LAB — CBC
HCT: 39.5 % (ref 36.0–46.0)
Hemoglobin: 12.6 g/dL (ref 12.0–15.0)
MCH: 28.3 pg (ref 26.0–34.0)
MCHC: 31.9 g/dL (ref 30.0–36.0)
MCV: 88.8 fL (ref 80.0–100.0)
Platelets: 272 10*3/uL (ref 150–400)
RBC: 4.45 MIL/uL (ref 3.87–5.11)
RDW: 12.3 % (ref 11.5–15.5)
WBC: 9.2 10*3/uL (ref 4.0–10.5)
nRBC: 0 % (ref 0.0–0.2)

## 2024-02-26 LAB — COMPREHENSIVE METABOLIC PANEL WITH GFR
ALT: 87 U/L — ABNORMAL HIGH (ref 0–44)
AST: 46 U/L — ABNORMAL HIGH (ref 15–41)
Albumin: 4.3 g/dL (ref 3.5–5.0)
Alkaline Phosphatase: 120 U/L (ref 38–126)
Anion gap: 13 (ref 5–15)
BUN: 16 mg/dL (ref 6–20)
CO2: 23 mmol/L (ref 22–32)
Calcium: 9.3 mg/dL (ref 8.9–10.3)
Chloride: 104 mmol/L (ref 98–111)
Creatinine, Ser: 0.72 mg/dL (ref 0.44–1.00)
GFR, Estimated: >60 mL/min (ref >60)
Glucose, Bld: 118 mg/dL — ABNORMAL HIGH (ref 70–99)
Potassium: 4 mmol/L (ref 3.5–5.1)
Sodium: 140 mmol/L (ref 135–145)
Total Bilirubin: <0.2 mg/dL (ref 0.0–1.2)
Total Protein: 7.2 g/dL (ref 6.5–8.1)

## 2024-02-26 LAB — POCT URINALYSIS DIPSTICK
Bilirubin, UA: NEGATIVE
Blood, UA: NEGATIVE
Glucose, UA: NEGATIVE
Ketones, UA: NEGATIVE
Leukocytes, UA: NEGATIVE
Nitrite, UA: NEGATIVE
Protein, UA: NEGATIVE
Spec Grav, UA: 1.01 (ref 1.010–1.025)
Urobilinogen, UA: NEGATIVE U/dL — AB
pH, UA: 7 (ref 5.0–8.0)

## 2024-02-26 LAB — HCG, QUANTITATIVE, PREGNANCY: hCG, Beta Chain, Quant, S: 2885 m[IU]/mL — ABNORMAL HIGH (ref <5)

## 2024-02-26 LAB — PREGNANCY, URINE: Preg Test, Ur: POSITIVE — AB

## 2024-02-26 MED ORDER — LEVETIRACETAM 500 MG PO TABS: ORAL_TABLET | ORAL | 11 refills | Status: AC

## 2024-02-26 NOTE — ED Triage Notes (Addendum)
 Pt via POV c/o lower abdominal cramping and spotting while [redacted] weeks pregnant. No n/v. G3P0 and pt notes 2 prior early miscarriages.

## 2024-02-26 NOTE — Progress Notes (Signed)
 SUBJECTIVE:  23 y.o. female who desires a STI screen. Denies abnormal vaginal discharge, bleeding or significant pelvic pain. Complaining of burning with urination. Denies history of known exposure to STD.  Patient's last menstrual period was 01/07/2024.  OBJECTIVE:  She appears well.   ASSESSMENT:  STI Screen   PLAN:  Pt offered STI blood screening-declined GC, chlamydia, and trichomonas probe sent to lab.  Treatment: To be determined once lab results are received.  Pt follow up as needed.

## 2024-02-26 NOTE — Discharge Instructions (Addendum)
 Your ultrasound shows a single intrauterine pregnancy estimated to be at 6 weeks and 3 days.  Your ultrasound shows a small subchorionic hemorrhage which is likely the source of your vaginal bleeding.  Your baby's heart rate was on the lower side at 107.   It is recommended that you have a follow-up ultrasound in 14 days to ensure your pregnancy is progressing appropriately.  As discussed, please attend your OB/GYN appointment tomorrow as scheduled.  Your liver labs and ALT, were slightly elevated today.  Please have these rechecked by your PCP within the next month.     EXAM:  ULTRASOUND FIRST TRIMESTER    CLINICAL HISTORY:  Vaginal bleeding; History of miscarriage. Pt has spotting that started this  morning; hx of previous miscarriage in February; multiple previous ultrasounds  for this pregnancy, most recent with no FHT. Pt scheduled for OP ultrasound  tomorrow AM.    TECHNIQUE:  Transabdominal and transvaginal first trimester obstetric pelvic duplex  ultrasound was performed with real-time imaging, color flow Doppler imaging,  and spectral analysis.    COMPARISON:  None provided.    FINDINGS:    UTERUS:  No focal myometrial mass.    GESTATIONAL SAC(S):  Single intrauterine gestational sac with yolk sac, embryo, and cardiac  activity. Small subacute hemorrhage.    YOLK SAC:  Present.    EMBRYO(<11WK) /FETUS(>=11WK):  Single embryo.    CROWN RUMP LENGTH:  6.3 mm, corresponding to 6 weeks and 3 days gestation.    RATE OF CARDIAC ACTIVITY:  Fetal heart rate 107 beats per minute.    RIGHT OVARY:  Unremarkable. Normal arterial and venous flow.    LEFT OVARY:  Poorly visualized.    FREE FLUID:  Trace free fluid.    MEASUREMENTS    ESTIMATED GESTATIONAL AGE BY CURRENT ULTRASOUND:  6 weeks and 3 days.    ESTIMATED DUE DATE:  10/18/2024.    IMPRESSION:  1. Single intrauterine gestation, measuring 6 weeks and 3 days by CRL, as above.  2. Fetal heart  rate is 107 beats per minute, suggesting first trimester  bradycardia. Consider follow-up pelvic ultrasound in 10-14 days, as clinically  warranted.    Electronically signed by: Zadie Herter MD 02/26/2024 09:42 PM EDT RP  Workstation: [id number]   Return to the ER for any other emergent concerns

## 2024-02-26 NOTE — ED Provider Notes (Signed)
 [place] [facility] AT [facility] Provider Note   CSN: [id number] Arrival date & time: 02/26/24  1919     Patient presents with: Vaginal Bleeding   Elizabeth Ray is a 23 y.o. female estimated to be about [redacted] weeks pregnant based on last menstrual period being at the end of April, presents with concern for vaginal bleeding ongoing for the past 2 days.  Reports bleeding was light at first, but has now become heavier.  Denies any passage of clots.  She also reports some mild lower abdominal cramping today, but this has since resolved.  Denies any lightheadedness, dizziness, fevers, chills.  Denies any dysuria or increased frequency.  She is established with OB/GYN and has an appointment next week regarding this pregnancy.    Vaginal Bleeding      Prior to Admission medications   Medication Sig Start Date End Date Taking? Authorizing Provider  benzonatate  (TESSALON ) 100 MG capsule Take 1 capsule (100 mg total) by mouth every 8 (eight) hours. Patient not taking: Reported on 02/26/2024 10/30/23   Albertus Hughs, DO  Boric Acid Vaginal 600 MG SUPP Place 600 mg vaginally at bedtime. Patient not taking: Reported on 02/26/2024 11/07/23   Everlina Hock, NP  cefadroxil  (DURICEF) 500 MG capsule Take 1 capsule (500 mg total) by mouth 2 (two) times daily. Patient not taking: Reported on 02/26/2024 01/10/24   Neil Balls A, PA  cephALEXin  (KEFLEX ) 500 MG capsule Take 1 capsule (500 mg total) by mouth 4 (four) times daily. Patient not taking: Reported on 02/26/2024 02/16/24   Mandy Second, PA-C  doxycycline  (VIBRAMYCIN ) 100 MG capsule Take 1 capsule (100 mg total) by mouth 2 (two) times daily. Patient not taking: Reported on 02/26/2024 12/13/23   Lowery Rue, DO  levETIRAcetam  (KEPPRA ) 500 MG tablet Take 1.5 tablets(750mg ) twice daily 02/26/24   Cervantes, Amadou, MD  ondansetron  (ZOFRAN -ODT) 4 MG disintegrating tablet 4mg  ODT q4 hours prn nausea/vomit 10/30/23   Floyd, Dan, DO   rizatriptan  (MAXALT ) 5 MG tablet Take 1 tablet (5 mg total) by mouth as needed for migraine. May repeat in 2 hours if needed Patient not taking: Reported on 02/26/2024 01/24/23   Everlina Hock, NP    Allergies: Patient has no known allergies.    Review of Systems  Genitourinary:  Positive for vaginal bleeding.    Updated Vital Signs BP 123/74   Pulse 92   Temp (!) 97.4 F (36.3 C)   Resp 20   Ht 5' 3 (1.6 m)   Wt 83.9 kg   LMP 01/07/2024   SpO2 97%   BMI 32.77 kg/m   Physical Exam Vitals and nursing note reviewed.  Constitutional:      General: She is not in acute distress.    Appearance: She is well-developed.  HENT:     Head: Normocephalic and atraumatic.   Eyes:     Conjunctiva/sclera: Conjunctivae normal.    Cardiovascular:     Rate and Rhythm: Normal rate and regular rhythm.     Heart sounds: No murmur heard. Pulmonary:     Effort: Pulmonary effort is normal. No respiratory distress.     Breath sounds: Normal breath sounds.  Abdominal:     Palpations: Abdomen is soft.     Tenderness: There is no abdominal tenderness.  Genitourinary:    Comments: Declines  Musculoskeletal:        General: No swelling.     Cervical back: Neck supple.   Skin:  General: Skin is warm and dry.     Capillary Refill: Capillary refill takes less than 2 seconds.   Neurological:     Mental Status: She is alert.   Psychiatric:        Mood and Affect: Mood normal.     (all labs ordered are listed, but only abnormal results are displayed) Labs Reviewed  PREGNANCY, URINE - Abnormal; Notable for the following components:      Result Value   Preg Test, Ur POSITIVE (*)    All other components within normal limits  HCG, QUANTITATIVE, PREGNANCY - Abnormal; Notable for the following components:   hCG, Beta Chain, Quant, S 2,885 (*)    All other components within normal limits  COMPREHENSIVE METABOLIC PANEL WITH GFR - Abnormal; Notable for the following components:    Glucose, Bld 118 (*)    AST 46 (*)    ALT 87 (*)    All other components within normal limits  CBC    EKG: None  Radiology: US  OB LESS THAN 14 WEEKS WITH OB TRANSVAGINAL Result Date: 02/26/2024 EXAM: ULTRASOUND FIRST TRIMESTER CLINICAL HISTORY: Vaginal bleeding; History of miscarriage. Pt has spotting that started this morning; hx of previous miscarriage in February; multiple previous ultrasounds for this pregnancy, most recent with no FHT. Pt scheduled for OP ultrasound tomorrow AM. TECHNIQUE: Transabdominal and transvaginal first trimester obstetric pelvic duplex ultrasound was performed with real-time imaging, color flow Doppler imaging, and spectral analysis. COMPARISON: None provided. FINDINGS: UTERUS: No focal myometrial mass. GESTATIONAL SAC(S): Single intrauterine gestational sac with yolk sac, embryo, and cardiac activity. Small subacute hemorrhage. YOLK SAC: Present. EMBRYO(<11WK) /FETUS(>=11WK): Single embryo. CROWN RUMP LENGTH: 6.3 mm, corresponding to 6 weeks and 3 days gestation. RATE OF CARDIAC ACTIVITY: Fetal heart rate 107 beats per minute. RIGHT OVARY: Unremarkable. Normal arterial and venous flow. LEFT OVARY: Poorly visualized. FREE FLUID: Trace free fluid. MEASUREMENTS ESTIMATED GESTATIONAL AGE BY CURRENT ULTRASOUND: 6 weeks and 3 days. ESTIMATED DUE DATE: 10/18/2024. IMPRESSION: 1. Single intrauterine gestation, measuring 6 weeks and 3 days by CRL, as above. 2. Fetal heart rate is 107 beats per minute, suggesting first trimester bradycardia. Consider follow-up pelvic ultrasound in 10-14 days, as clinically warranted. Electronically signed by: Zadie Herter MD 02/26/2024 09:42 PM EDT RP Workstation: [id number]     Procedures   Medications Ordered in the ED - No data to display                                  Medical Decision Making Amount and/or Complexity of Data Reviewed Labs: ordered. Radiology: ordered.     Differential diagnosis includes but is not limited  to Cholelithiasis, cholangitis, choledocholithiasis, peptic ulcer, gastritis, gastroenteritis, appendicitis, IBS, IBD, DKA, nephrolithiasis, UTI, pyelonephritis, pancreatitis, diverticulitis,  pregnancy related concerns in females of childbearing age    ED Course:  Upon initial evaluation, patient is well-appearing, no acute distress.  Stable vitals.  Abdomen is soft and nontender to palpation.  Labs Ordered: I Ordered, and personally interpreted labs.  The pertinent results include:   CBC within normal limits CMP with mild elevation in AST and ALT at 46 and 87 respectively.  Beta hcg at 2885  Imaging Studies ordered: I ordered imaging studies including transvaginal ultrasound I independently visualized the imaging with scope of interpretation limited to determining acute life threatening conditions related to emergency care. Imaging showed  Single intrauterine gestation measuring 6 weeks and 3 days by  crown-rump length.  Fetal heart rate 107 bpm, suggesting first trimester bradycardia. Small subacute hemorrhage I agree with the radiologist interpretation  Medications Given: None  Upon re-evaluation, patient remains well appearing with stable vitals.  She states she was seen by [facility] 2 days ago for vaginal bleeding.  I did review the notes from her visit 2 days ago which showed beta-hCG at 3870 and ultrasound at that time noted a twin pregnancy.  Beta-hCG today was lower at 2885, and only 1 embryo noted on ultrasound read today.  Given this, I do have concern that this could be a sign of early miscarriage.  She states she has a follow-up with her OB/GYN tomorrow morning.  I encouraged her to attend this appointment for further evaluation and repeat labs and imaging.  She verbalized understanding.  She does not have any significant abdominal tenderness palpation, no leukocytosis, no significant elevation in AST or creatinine, low concern for other acute intra-abdominal pathology at this  time.  No acute blood loss anemia.  Stable and appropriate for discharge home.    Impression: Vaginal bleeding in pregnancy  Disposition:  The patient was discharged home with instructions to attend her OB/GYN appointment tomorrow morning for further management and evaluation.  She was informed that the radiologist recommended repeat ultrasound in 14 days. Return precautions given.    Record Review: External records from outside source obtained and reviewed including [facility] visit from 02/23/2024     This chart was dictated using voice recognition software, Dragon. Despite the best efforts of this provider to proofread and correct errors, errors may still occur which can change documentation meaning.       Final diagnoses:  Vaginal bleeding in pregnancy    ED Discharge Orders     None          Rexie Catena, PA-C 02/27/24 0034    Rolinda Climes, DO 02/29/24 1521

## 2024-02-26 NOTE — Telephone Encounter (Signed)
 Brief phone check, as agreed-upon yesterday; pt is feeling more hopeful with friend support, while waiting on job and housing confirmation.

## 2024-02-27 ENCOUNTER — Ambulatory Visit: Admitting: Family Medicine

## 2024-02-27 ENCOUNTER — Ambulatory Visit: Payer: Self-pay | Admitting: Obstetrics and Gynecology

## 2024-02-27 ENCOUNTER — Ambulatory Visit

## 2024-02-27 DIAGNOSIS — Z3A01 Less than 8 weeks gestation of pregnancy: Secondary | ICD-10-CM

## 2024-02-27 DIAGNOSIS — O3680X0 Pregnancy with inconclusive fetal viability, not applicable or unspecified: Secondary | ICD-10-CM | POA: Insufficient documentation

## 2024-02-27 DIAGNOSIS — O039 Complete or unspecified spontaneous abortion without complication: Secondary | ICD-10-CM | POA: Diagnosis not present

## 2024-02-27 LAB — CERVICOVAGINAL ANCILLARY ONLY
Bacterial Vaginitis (gardnerella): NEGATIVE
Candida Glabrata: NEGATIVE
Candida Vaginitis: NEGATIVE
Chlamydia: NEGATIVE
Comment: NEGATIVE
Comment: NEGATIVE
Comment: NEGATIVE
Comment: NEGATIVE
Comment: NEGATIVE
Comment: NORMAL
Neisseria Gonorrhea: NEGATIVE
Trichomonas: NEGATIVE

## 2024-02-27 NOTE — Progress Notes (Signed)
 Subjective:  Elizabeth Ray is a 23 y.o. female who presents to the clinic today for viability ultrasound.  HPI: Patient was seen 6/11 for evaluation of pregnancy.  hCG at that time was 2400.  She was having some spotting at the time.  She was then seen on 6/14 for more spotting.  hCG at that time was 3870.  The bleeding continued so she was seen in an emergency department yesterday 6/17 and had a repeat hCG which was decreasing to 2800.  This was at a different hospital system than the previous hCG.  She also had an ultrasound showing an IUP with a heart rate of 120.  Reports she has not had heavy bleeding but just spotting with occasional small clots.  Objective:  Physical Exam: LMP 01/07/2024   Gen: Alert, visibly concerned  CV: Regular rate Pulm: Normal work of breathing, speaking in full sentences Skin: warm, dry Neuro: grossly normal, moves all extremities Psych: Anxious and occasionally tearful, patient with miscarriage earlier this year  Results for orders placed or performed during the hospital encounter of 02/26/24 (from the past 72 hours)  Pregnancy, urine     Status: Abnormal   Collection Time: 02/26/24  7:34 PM  Result Value Ref Range   Preg Test, Ur POSITIVE (A) NEGATIVE    Comment:        THE SENSITIVITY OF THIS METHODOLOGY IS >25 mIU/mL. Performed at [facility], [address] [place], [place] [address]   CBC     Status: None   Collection Time: 02/26/24  7:34 PM  Result Value Ref Range   WBC 9.2 4.0 - 10.5 K/uL   RBC 4.45 3.87 - 5.11 MIL/uL   Hemoglobin 12.6 12.0 - 15.0 g/dL   HCT 21.3 08.6 - 57.8 %   MCV 88.8 80.0 - 100.0 fL   MCH 28.3 26.0 - 34.0 pg   MCHC 31.9 30.0 - 36.0 g/dL   RDW 46.9 62.9 - 52.8 %   Platelets 272 150 - 400 K/uL   nRBC 0.0 0.0 - 0.2 %    Comment: Performed at [facility], [address] [place], [place] [address]  hCG, quantitative, pregnancy     Status: Abnormal   Collection Time: 02/26/24  7:34 PM   Result Value Ref Range   hCG, Beta Chain, Quant, S 2,885 (H) <5 mIU/mL    Comment:          GEST. AGE      CONC.  (mIU/mL)   <=1 WEEK        5 - 50     2 WEEKS       50 - 500     3 WEEKS       100 - 10,000     4 WEEKS     1,000 - 30,000     5 WEEKS     3,500 - 115,000   6-8 WEEKS     12,000 - 270,000    12 WEEKS     15,000 - 220,000        FEMALE AND NON-PREGNANT FEMALE:     LESS THAN 5 mIU/mL Performed at [facility], [address] [place], [place] [address]   Comprehensive metabolic panel     Status: Abnormal   Collection Time: 02/26/24  7:34 PM  Result Value Ref Range   Sodium 140 135 - 145 mmol/L   Potassium 4.0 3.5 - 5.1  mmol/L   Chloride 104 98 - 111 mmol/L   CO2 23 22 - 32 mmol/L   Glucose, Bld 118 (H) 70 - 99 mg/dL    Comment: Glucose reference range applies only to samples taken after fasting for at least 8 hours.   BUN 16 6 - 20 mg/dL   Creatinine, Ser 0.34 0.44 - 1.00 mg/dL   Calcium 9.3 8.9 - 74.2 mg/dL   Total Protein 7.2 6.5 - 8.1 g/dL   Albumin 4.3 3.5 - 5.0 g/dL   AST 46 (H) 15 - 41 U/L   ALT 87 (H) 0 - 44 U/L   Alkaline Phosphatase 120 38 - 126 U/L   Total Bilirubin <0.2 0.0 - 1.2 mg/dL   GFR, Estimated >59 >56 mL/min    Comment: (NOTE) Calculated using the CKD-EPI Creatinine Equation (2021)    Anion gap 13 5 - 15    Comment: Performed at [facility], [address] [place], [place] [address]     Assessment/Plan:  Pregnancy with uncertain fetal viability Patient presenting for evaluation for viability scan.  Had ultrasound yesterday showing a gestational sac with fetal pole and cardiac activity with heart rate of 123.  Ultrasound today similar to yesterday's.  Measuring 6 weeks 5 days with fetal pole.  Fetal heart rate 108.  Subjectively low gestational sac.  There is also a probable corpus luteum cyst in the left ovary.  Beta-hCG values are concerning given they were 2400 a week ago, they increased to 3800, and yesterday they  were down to 2800.  Discussed at length that this could be a viable pregnancy but could also be early signs of a miscarriage.  Did not recommend repeat hCG levels but recommended repeat viability scan in 10 days.  Discussed strict MAU precautions.  No further questions or concerns.   Lab Orders  No laboratory test(s) ordered today    No orders of the defined types were placed in this encounter.     Janna Melter, MD Attending Family Medicine Physician, [facility] for [facility], [facility]   02/27/24 11:37 AM

## 2024-02-27 NOTE — Assessment & Plan Note (Signed)
 Patient presenting for evaluation for viability scan.  Had ultrasound yesterday showing a gestational sac with fetal pole and cardiac activity with heart rate of 123.  Ultrasound today similar to yesterday's.  Measuring 6 weeks 5 days with fetal pole.  Fetal heart rate 108.  Subjectively low gestational sac.  There is also a probable corpus luteum cyst in the left ovary.  Beta-hCG values are concerning given they were 2400 a week ago, they increased to 3800, and yesterday they were down to 2800.  Discussed at length that this could be a viable pregnancy but could also be early signs of a miscarriage.  Did not recommend repeat hCG levels but recommended repeat viability scan in 10 days.  Discussed strict MAU precautions.  No further questions or concerns.

## 2024-03-04 ENCOUNTER — Encounter

## 2024-03-05 ENCOUNTER — Other Ambulatory Visit: Payer: Self-pay

## 2024-03-05 ENCOUNTER — Telehealth: Payer: Self-pay | Admitting: Neurology

## 2024-03-05 DIAGNOSIS — O3680X0 Pregnancy with inconclusive fetal viability, not applicable or unspecified: Secondary | ICD-10-CM

## 2024-03-05 NOTE — Telephone Encounter (Signed)
 Pt called  to request to speak to Md or nurse the patient had seizure this morning  at work and had to go to ER .Pt states this has never happen to her at [facility] . Pt is concern and would llike to speak to nurse or MD

## 2024-03-05 NOTE — Telephone Encounter (Signed)
 Called and spoke to pt who stated that she had a sz at work today and was taken to hospital by ems. Pt stated that she missed one does of keppra  due to being late for work. Pt also stated that  she had a Miscarriage Wednesday June 18th and a lot of bleeding. Pt has appt on 03/12/24.

## 2024-03-06 ENCOUNTER — Ambulatory Visit

## 2024-03-06 NOTE — Telephone Encounter (Signed)
 Please schedule a follow up appointment and add her to the cancellation list. Thanks

## 2024-03-10 ENCOUNTER — Other Ambulatory Visit

## 2024-03-11 ENCOUNTER — Encounter

## 2024-03-12 ENCOUNTER — Encounter

## 2024-03-12 ENCOUNTER — Ambulatory Visit: Admitting: Family Medicine

## 2024-03-27 ENCOUNTER — Ambulatory Visit: Admitting: Family Medicine

## 2024-04-02 ENCOUNTER — Telehealth: Payer: Self-pay | Admitting: Clinical

## 2024-04-02 NOTE — Telephone Encounter (Signed)
 Attempt to reschedule; Unable to leave message as mailbox has not been set up.

## 2024-04-03 ENCOUNTER — Ambulatory Visit

## 2024-04-09 ENCOUNTER — Ambulatory Visit: Admitting: Family Medicine

## 2024-04-14 ENCOUNTER — Ambulatory Visit: Admitting: Medical

## 2024-04-18 ENCOUNTER — Telehealth: Payer: Self-pay | Admitting: Clinical

## 2024-04-18 NOTE — Telephone Encounter (Signed)
 Attempt call regarding reschedule; Unable to leave voice message.

## 2024-04-29 ENCOUNTER — Encounter: Payer: Self-pay | Admitting: Obstetrics and Gynecology

## 2024-04-29 ENCOUNTER — Other Ambulatory Visit (HOSPITAL_COMMUNITY)
Admission: RE | Admit: 2024-04-29 | Discharge: 2024-04-29 | Disposition: A | Discharge status: Home / Self Care | Source: Ambulatory Visit | Attending: Obstetrics and Gynecology | Admitting: Obstetrics and Gynecology

## 2024-04-29 ENCOUNTER — Ambulatory Visit (INDEPENDENT_AMBULATORY_CARE_PROVIDER_SITE_OTHER): Admitting: Obstetrics and Gynecology

## 2024-04-29 VITALS — BP 120/68 | HR 67 | Wt 193.0 lb

## 2024-04-29 DIAGNOSIS — O039 Complete or unspecified spontaneous abortion without complication: Secondary | ICD-10-CM | POA: Diagnosis not present

## 2024-04-29 DIAGNOSIS — Z113 Encounter for screening for infections with a predominantly sexual mode of transmission: Secondary | ICD-10-CM

## 2024-04-29 DIAGNOSIS — Z3A01 Less than 8 weeks gestation of pregnancy: Secondary | ICD-10-CM | POA: Diagnosis not present

## 2024-04-29 DIAGNOSIS — R635 Abnormal weight gain: Secondary | ICD-10-CM

## 2024-04-29 NOTE — Progress Notes (Signed)
   GYNECOLOGY PROGRESS NOTE  History:  23 y.o. G2P0010 presents to [facility] for SAB follow up. She was seen 6/18 and later that showed IUP but concerned. Later that day she went to the hospital for bleeding and ultrasound revealed no longer an IUP, and was diagnosed with SAB. She is doing ok overall, still processing. She does not desire BC at this time . She has had two periods now   She reports someone told her to monitor her sugar levels during pregnancy, and she would like to get lab work to make sure everything is okay, she feels she is gaining wait    Health Maintenance Due  Topic Date Due   COVID-19 Vaccine (1) Never done   INFLUENZA VACCINE  04/11/2024     Review of Systems:  Pertinent items are noted in HPI.   Objective:  Physical Exam Last menstrual period 01/07/2024. VS reviewed, nursing note reviewed,  Constitutional: well developed, well nourished, no distress HEENT: normocephalic CV: normal rate Pulm/chest wall: normal effort Breast Exam: deferred Abdomen: soft Neuro: alert and oriented x 3 Skin: warm, dry Psych: affect normal Pelvic exam: Cervix pink, visually closed, without lesion, scant white creamy discharge, vaginal walls and external genitalia normal Chaperone present for exam  Assessment & Plan:  1. Spontaneous miscarriage (Primary) Offered condolences Is interested in counseling Does not desire bc at this time Lots of questions about future pregnancy and health discussed when plan for pregnancy, can come in for labs with new pregnancy, check progesterone    2. Screen for STD (sexually transmitted disease)  - Cervicovaginal ancillary only  3. Weight gain  - CBC - Comp Met (CMET) - HgB A1c - TSH Rfx on Abnormal to Free T4   Nidia Daring, FNP

## 2024-04-30 ENCOUNTER — Ambulatory Visit: Payer: Self-pay | Admitting: Obstetrics and Gynecology

## 2024-04-30 LAB — CERVICOVAGINAL ANCILLARY ONLY
Bacterial Vaginitis (gardnerella): NEGATIVE
Candida Glabrata: NEGATIVE
Candida Vaginitis: NEGATIVE
Chlamydia: NEGATIVE
Comment: NEGATIVE
Comment: NEGATIVE
Comment: NEGATIVE
Comment: NEGATIVE
Comment: NEGATIVE
Comment: NORMAL
Neisseria Gonorrhea: NEGATIVE
Trichomonas: NEGATIVE

## 2024-04-30 LAB — COMPREHENSIVE METABOLIC PANEL WITH GFR
ALT: 12 IU/L (ref 0–32)
AST: 16 IU/L (ref 0–40)
Albumin: 4.4 g/dL (ref 4.0–5.0)
Alkaline Phosphatase: 117 IU/L (ref 44–121)
BUN/Creatinine Ratio: 10 (ref 9–23)
BUN: 8 mg/dL (ref 6–20)
Bilirubin Total: 0.4 mg/dL (ref 0.0–1.2)
CO2: 23 mmol/L (ref 20–29)
Calcium: 9.8 mg/dL (ref 8.7–10.2)
Chloride: 105 mmol/L (ref 96–106)
Creatinine, Ser: 0.8 mg/dL (ref 0.57–1.00)
Globulin, Total: 2.9 g/dL (ref 1.5–4.5)
Glucose: 82 mg/dL (ref 70–99)
Potassium: 4.8 mmol/L (ref 3.5–5.2)
Sodium: 140 mmol/L (ref 134–144)
Total Protein: 7.3 g/dL (ref 6.0–8.5)
eGFR: 105 mL/min/1.73 (ref >59)

## 2024-04-30 LAB — CBC
Hematocrit: 40.3 % (ref 34.0–46.6)
Hemoglobin: 13.1 g/dL (ref 11.1–15.9)
MCH: 28.9 pg (ref 26.6–33.0)
MCHC: 32.5 g/dL (ref 31.5–35.7)
MCV: 89 fL (ref 79–97)
Platelets: 317 x10E3/uL (ref 150–450)
RBC: 4.53 x10E6/uL (ref 3.77–5.28)
RDW: 12 % (ref 11.7–15.4)
WBC: 7.9 x10E3/uL (ref 3.4–10.8)

## 2024-04-30 LAB — TSH RFX ON ABNORMAL TO FREE T4: TSH: 1.56 u[IU]/mL (ref 0.450–4.500)

## 2024-04-30 LAB — HEMOGLOBIN A1C
Est. average glucose Bld gHb Est-mCnc: 111 mg/dL
Hgb A1c MFr Bld: 5.5 % (ref 4.8–5.6)

## 2024-05-13 ENCOUNTER — Ambulatory Visit: Payer: Self-pay

## 2024-05-13 ENCOUNTER — Ambulatory Visit: Admitting: Family

## 2024-05-13 NOTE — Telephone Encounter (Signed)
 FYI Only or Action Required?: FYI only for provider.  Patient was last seen in primary care on 02/27/2024 by Cresenzo, John V, MD.  Called Nurse Triage reporting Chest Pain, Nasal Congestion, and Headache.  Symptoms began several days ago.  Interventions attempted: OTC medications: Mucinex  and Zyrtec .  Symptoms are: unchanged.  Triage Disposition: See PCP When Office is Open (Within 3 Days) (overriding Home Care)  Patient/caregiver understands and will follow disposition?: Yes                            Reason for Disposition  [1] Sinus congestion as part of a cold AND [2] present < 10 days  Answer Assessment - Initial Assessment Questions 1. LOCATION: Where does it hurt?      Forehead and bridge of nose 2. ONSET: When did the sinus pain start?  (e.g., hours, days)      A few days 3. SEVERITY: How bad is the pain?   (Scale 0-10; or none, mild, moderate or severe)     Mild, states pain is not bad 4. RECURRENT SYMPTOM: Have you ever had sinus problems before? If Yes, ask: When was the last time? and What happened that time?      States she has bad allergies  5. NASAL CONGESTION: Is the nose blocked? If Yes, ask: Can you open it or must you breathe through your mouth?     Denies difficulty breathing  6. NASAL DISCHARGE: Do you have discharge from your nose? If so ask, What color?     Yellow with specks of blood 7. FEVER: Do you have a fever? If Yes, ask: What is it, how was it measured, and when did it start?      Denies 8. OTHER SYMPTOMS: Do you have any other symptoms? (e.g., sore throat, cough, earache, difficulty breathing)     Mild chest discomfort that comes and goes- related to congestion, sore throat, sweating, lightheadedness, denies cough, denies difficulty breathing, denies NV, denies numbness, denies weakness, denies blurred vision, denies speech changes  Protocols used: Sinus Pain or Congestion-A-AH

## 2024-05-13 NOTE — Telephone Encounter (Signed)
 Appt scheduled

## 2024-05-13 NOTE — Telephone Encounter (Signed)
 Call dropped upon transfer to NT. This RN made first outbound attempt to connect with patient. No answer, unable to LVM. Routing for additional attempts.   Copied from CRM #[id number]. Topic: Clinical - Red Word Triage >> May 13, 2024 11:42 AM Elizabeth Ray wrote: Red Word that prompted transfer to Nurse Triage: fever,sharp chest pain,headache,nausea,light headed,fatigue

## 2024-05-14 ENCOUNTER — Ambulatory Visit: Admitting: Medical

## 2024-05-14 VITALS — BP 112/80 | HR 84 | Temp 98.0°F | Resp 16 | Ht 63.0 in | Wt 187.2 lb

## 2024-05-14 DIAGNOSIS — R051 Acute cough: Secondary | ICD-10-CM

## 2024-05-14 DIAGNOSIS — R0981 Nasal congestion: Secondary | ICD-10-CM | POA: Diagnosis not present

## 2024-05-14 DIAGNOSIS — R067 Sneezing: Secondary | ICD-10-CM

## 2024-05-14 LAB — POCT INFLUENZA A/B
Influenza A, POC: NEGATIVE
Influenza B, POC: NEGATIVE

## 2024-05-14 LAB — POC COVID19 BINAXNOW: SARS Coronavirus 2 Ag: NEGATIVE

## 2024-05-14 NOTE — Patient Instructions (Addendum)
 Allergic rhinitis Mild allergic rhinitis with nasal congestion, rhinorrhea, and sneezing, consistent with seasonal allergies. Negative COVID-19  and flu tests.(Copy of those test given at your request) - Prescribed Xyzal (levocetirizine) at night. - Prescribed Flonase  (fluticasone ) nasal spray. - Advised to report worsening symptoms or new symptoms such as sinus infection or bronchitis signs/syptoms.  Cough Mild cough. - Advised to report worsening cough or new symptoms such as chest congestion or productive cough. - Discussed potential use of benzonatate  if cough worsens. - Discussed conditional use of antibiotics if symptoms worsen significantly indicating bronchitis especially between Friday and Monday.  Follow up date to be determined. Depends on response to above treatment.

## 2024-05-14 NOTE — Progress Notes (Unsigned)
 Subjective:    Patient ID: Elizabeth Ray, female    DOB: December 22, 2000, 23 y.o.   MRN: [id number]  HPI  Elizabeth Ray is a 23 year old female who presents with nasal congestion and ear blockage.  She has been experiencing nasal congestion and ear blockage for the past couple of days. The nasal congestion is accompanied by a significant amount of mucus and some chest congestion. Her ears feel blocked, and she notes a significant amount of earwax.  She experiences sneezing and a runny nose but no itchy eyes. She has not had a fever, chills, sweats, or body aches, although she mentions feeling subjectively warm. She feels sleepy but not significantly fatigued.  She typically experiences allergies later in the winter, but not usually this time of year. 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  She has a mild cough, which she describes as not too bothersome. She has previously taken a COVID test, which was negative.     Lmp- 3 weeks ago.  Review of Systems  Constitutional:  Negative for chills, fatigue and fever.       But mild subjective fever.  HENT:  Positive for congestion. Negative for sinus pressure, sinus pain and sore throat.        Ears congested.  Respiratory:  Negative for chest tightness, shortness of breath and wheezing.   Cardiovascular:  Negative for chest pain and palpitations.  Gastrointestinal:  Negative for abdominal pain, diarrhea and vomiting.  Genitourinary:  Negative for dysuria and frequency.  Musculoskeletal:  Negative for back pain and myalgias.  Skin:  Negative for rash.  Neurological:  Negative for dizziness, speech difficulty and light-headedness.  Hematological:  Negative for adenopathy. Does not bruise/bleed easily.  Psychiatric/Behavioral:  Negative for behavioral problems, dysphoric mood and sleep disturbance. The patient is not nervous/anxious.     Past Medical History:   Diagnosis Date   ADHD (attention deficit hyperactivity disorder)    Anxiety    Bipolar and related disorder (HCC) 07/15/2015   Depression    Mental disorder    Seizures (HCC)    up until age 18     Social History   Socioeconomic History   Marital status: Single    Spouse name: Not on file   Number of children: Not on file   Years of education: Not on file   Highest education level: 12th grade  Occupational History   Not on file  Tobacco Use   Smoking status: Former    Current packs/day: 0.50    Types: Cigarettes   Smokeless tobacco: Never   Tobacco comments:    11/27/2023- Pt states she smokes 2 cigarettes a day   Vaping Use   Vaping status: Never Used  Substance and Sexual Activity   Alcohol use: Not Currently    Comment: occ   Drug use: No   Sexual activity: Yes  Other Topics Concern   Not on file  Social History Narrative   Not on file   Social Drivers of Health   Financial Resource Strain: Patient Declined (09/03/2023)   Overall Financial Resource Strain (CARDIA)    Difficulty of Paying Living Expenses: Patient declined  Food Insecurity: Patient Declined (09/03/2023)   Hunger Vital Sign    Worried About Running Out of Food in the Last Year: Patient declined    Ran Out of Food in the Last Year: Patient declined  Transportation Needs: Patient Declined (09/03/2023)   PRAPARE - Administrator, Civil Service (Medical): Patient declined    Lack of Transportation (Non-Medical): Patient declined  Physical Activity: Unknown (09/03/2023)   Exercise Vital Sign    Days of Exercise per Week: Patient declined    Minutes of Exercise per Session: Not on file  Stress: No Stress Concern Present (09/03/2023)   [employer] of Occupational Health - Occupational Stress Questionnaire    Feeling of Stress : Only a little  Social Connections: Moderately Isolated (09/03/2023)   Social Connection and Isolation Panel    Frequency of Communication with Friends and  Family: Once a week    Frequency of Social Gatherings with Friends and Family: Once a week    Attends Religious Services: 1 to 4 times per year    Active Member of [employer] or Organizations: Yes    Attends Banker Meetings: Never    Marital Status: Divorced  Catering manager Violence: Unknown (01/26/2022)   Received from [facility]   HITS    Physically Hurt: Not on file    Insult or Talk Down To: Not on file    Threaten Physical Harm: Not on file    Scream or Curse: Not on file    Past Surgical History:  Procedure Laterality Date   DILATION AND EVACUATION N/A 07/16/2014   Procedure: DILATATION AND EVACUATION;  Surgeon: Norleen Edsel GAILS, MD;  Location: [facility];  Service: Gynecology;  Laterality: N/A;    Family History  Problem Relation Age of Onset   Diabetes Mother    Diabetes Other    Hypertension Other     No Known Allergies  Current Outpatient Medications on File Prior to Visit  Medication Sig Dispense Refill   levETIRAcetam  (KEPPRA ) 500 MG tablet Take 1.5 tablets(750mg ) twice daily 90 tablet 11   No current facility-administered medications on file prior to visit.    BP 112/80   Pulse 84   Temp 98 F (36.7 C) (Oral)   Resp 16   Ht 5' 3 (1.6 m)   Wt 187 lb 3.2 oz (84.9 kg)   LMP 04/21/2024 (Exact Date)   SpO2 98%   BMI 33.16 kg/m        Objective:   Physical Exam  General Mental Status- Alert. General Appearance- Not in acute distress.   Skin General: Color- Normal Color. Moisture- Normal Moisture.  Neck Carotid Arteries- Normal color. Moisture- Normal Moisture. No carotid bruits. No JVD.  Chest and Lung Exam Auscultation: Breath Sounds:-Normal.  Cardiovascular Auscultation:Rythm- Regular. Murmurs & Other Heart Sounds:Auscultation of the heart reveals- No Murmurs.  Abdomen Inspection:-Inspeection Normal. Palpation/Percussion:Note:No mass. Palpation and Percussion of the abdomen reveal- Non Tender, Non Distended + BS, no rebound or  guarding.   Neurologic Cranial Nerve exam:- CN III-XII intact(No nystagmus), symmetric smile. Strength:- 5/5 equal and symmetric strength both upper and lower extremities.    Heent -nasal congestion. No frontal or maxillary sinus pressure. Boggy turbinates. +pnd.     Assessment & Plan:   Patient Instructions  Allergic rhinitis Mild allergic rhinitis with nasal congestion, rhinorrhea, and sneezing, consistent with seasonal allergies. Negative COVID-19  and flu tests.(Copy of those test given at your request) - Prescribed Xyzal (levocetirizine) at night. - Prescribed Flonase  (fluticasone ) nasal spray. - Advised to report worsening symptoms or new symptoms such as sinus infection or bronchitis signs/syptoms.  Cough Mild cough. - Advised to report worsening cough or new symptoms such as chest congestion or productive cough. -  Discussed potential use of benzonatate  if cough worsens. - Discussed conditional use of antibiotics if symptoms worsen significantly indicating bronchitis especially between Friday and Monday.  Follow up date to be determined. Depends on response to above treatment.   Keelon Zurn, PA-C

## 2024-05-26 ENCOUNTER — Encounter: Payer: Self-pay | Admitting: Family Medicine

## 2024-05-26 ENCOUNTER — Ambulatory Visit (INDEPENDENT_AMBULATORY_CARE_PROVIDER_SITE_OTHER): Admitting: Family Medicine

## 2024-05-26 ENCOUNTER — Other Ambulatory Visit (HOSPITAL_COMMUNITY)
Admission: RE | Admit: 2024-05-26 | Discharge: 2024-05-26 | Disposition: A | Discharge status: Home / Self Care | Source: Ambulatory Visit | Attending: Family Medicine | Admitting: Family Medicine

## 2024-05-26 VITALS — BP 137/70 | HR 80 | Ht 63.0 in | Wt 193.0 lb

## 2024-05-26 DIAGNOSIS — Z0000 Encounter for general adult medical examination without abnormal findings: Secondary | ICD-10-CM

## 2024-05-26 DIAGNOSIS — N926 Irregular menstruation, unspecified: Secondary | ICD-10-CM

## 2024-05-26 DIAGNOSIS — N898 Other specified noninflammatory disorders of vagina: Secondary | ICD-10-CM

## 2024-05-26 DIAGNOSIS — D229 Melanocytic nevi, unspecified: Secondary | ICD-10-CM | POA: Diagnosis not present

## 2024-05-26 LAB — POCT URINE PREGNANCY: Preg Test, Ur: NEGATIVE

## 2024-05-26 NOTE — Progress Notes (Signed)
 Complete physical exam  Patient: Elizabeth Ray   DOB: 06-19-2001   23 y.o. Female  MRN: [id number]  Subjective:    Chief Complaint  Patient presents with   Annual Exam    Elizabeth Ray Falling is a 23 y.o. female who presents today for a complete physical exam. She reports consuming a general diet. The patient does not participate in regular exercise at present. She generally feels well. She reports sleeping well. She does not have additional problems to discuss today.   Currently lives with: husband Acute concerns or interim problems since last visit:   Vision concerns: planning to schedule  Dental concerns: planning to schedule   ETOH use: no Nicotine use: trying to quit, 2 weeks without smoking  Recreational drugs/illegal substances: no   Females:  She is currently  sexually active  Contraception choices are: none LMP: 05/19/24   Recent period light, watery, only lasted for one day. Some blood clots and tissue. Bleeding was one week prior to when she expected her period to start. 2 months post-miscarriage (02/27/24). Reports July and August periods were normal. No pain.   Vaginal discharge for 2 weeks; white, odorous, like BV per patient; itching.   Reports her moles on face seem more raised the past few months; requesting dermatology referral.    Most recent fall risk assessment:    05/26/2024    2:17 PM  Fall Risk   Falls in the past year? 0  Number falls in past yr: 0  Injury with Fall? 0  Risk for fall due to : No Fall Risks  Follow up Falls evaluation completed     Most recent depression screenings:    05/26/2024    2:17 PM 05/14/2024    1:24 PM  PHQ 2/9 Scores  PHQ - 2 Score 1 2  PHQ- 9 Score 6 5            Patient Care Team: Almarie Waddell NOVAK, NP as PCP - General (Family Medicine)   Outpatient Medications Prior to Visit  Medication Sig   levETIRAcetam  (KEPPRA ) 500 MG tablet Take 1.5 tablets(750mg ) twice daily   No facility-administered  medications prior to visit.    ROS All review of systems negative except what is listed in the HPI        Objective:     BP 137/70   Pulse 80   Ht 5' 3 (1.6 m)   Wt 193 lb (87.5 kg)   LMP 04/21/2024 (Exact Date)   SpO2 100%   BMI 34.19 kg/m    Physical Exam Vitals reviewed.  Constitutional:      General: She is not in acute distress.    Appearance: Normal appearance. She is not ill-appearing.  HENT:     Head: Normocephalic and atraumatic.     Right Ear: Tympanic membrane normal.     Left Ear: Tympanic membrane normal.     Nose: Nose normal.     Mouth/Throat:     Mouth: Mucous membranes are moist.     Pharynx: Oropharynx is clear.  Eyes:     Extraocular Movements: Extraocular movements intact.     Conjunctiva/sclera: Conjunctivae normal.     Pupils: Pupils are equal, round, and reactive to light.  Cardiovascular:     Rate and Rhythm: Normal rate and regular rhythm.     Pulses: Normal pulses.     Heart sounds: Normal heart sounds.  Pulmonary:     Effort: Pulmonary effort is normal.  Breath sounds: Normal breath sounds.  Abdominal:     General: Abdomen is flat. Bowel sounds are normal. There is no distension.     Palpations: Abdomen is soft. There is no mass.     Tenderness: There is no abdominal tenderness. There is no right CVA tenderness, left CVA tenderness, guarding or rebound.  Genitourinary:    Comments: Deferred exam Musculoskeletal:        General: Normal range of motion.     Cervical back: Normal range of motion and neck supple. No tenderness.     Right lower leg: No edema.     Left lower leg: No edema.  Lymphadenopathy:     Cervical: No cervical adenopathy.  Skin:    General: Skin is warm and dry.     Capillary Refill: Capillary refill takes less than 2 seconds.  Neurological:     General: No focal deficit present.     Mental Status: She is alert and oriented to person, place, and time. Mental status is at baseline.  Psychiatric:         Mood and Affect: Mood normal.        Behavior: Behavior normal.        Thought Content: Thought content normal.        Judgment: Judgment normal.              Results for orders placed or performed in visit on 05/26/24  POCT urine pregnancy  Result Value Ref Range   Preg Test, Ur Negative Negative       Assessment & Plan:    Routine Health Maintenance and Physical Exam Discussed health promotion and safety including diet and exercise recommendations, dental health, and injury prevention. Tobacco cessation if applicable. Seat belts, sunscreen, smoke detectors, etc.    Immunization History  Administered Date(s) Administered   DTaP 01/02/2000, 03/05/2000, 05/04/2000, 02/08/2001, 12/05/2004   HIB (PRP-OMP) 01/02/2000, 03/05/2000, 05/04/2000, 02/08/2001   Hepatitis A 02/05/2007, 08/07/2007   Hepatitis B 01/02/2000, 05/04/2000, 12/05/2004   Hpv-Unspecified 09/01/2011, 12/04/2011, 12/05/2012   IPV 01/02/2000, 03/05/2000, 05/04/2000, 12/05/2004   Influenza,inj,Quad PF,6+ Mos 07/16/2015, 07/23/2017   Influenza-Unspecified 06/10/2010, 09/01/2011, 06/04/2012   MMR 11/14/2000, 12/24/2008   Meningococcal Conjugate 12/05/2012, 02/22/2017   PPD Test 06/26/2014   Pneumococcal-Unspecified 01/02/2000, 03/05/2000, 05/04/2000, 08/10/2000   Tdap 05/04/2011, 03/18/2019, 12/05/2021, 07/10/2022, 01/10/2024   Varicella 11/14/2000, 12/24/2008    Health Maintenance  Topic Date Due   COVID-19 Vaccine (1) 06/08/2024 (Originally 11/07/2004)   Pneumococcal Vaccine (1 of 2 - PCV) 10/24/2024 (Originally 11/07/2018)   Influenza Vaccine  12/09/2024 (Originally 04/11/2024)   CHLAMYDIA SCREENING  04/29/2025   Cervical Cancer Screening (Pap smear)  11/29/2026   DTaP/Tdap/Td (11 - Td or Tdap) 01/09/2034   Hepatitis B Vaccines 19-59 Average Risk  Completed   HPV VACCINES  Completed   Hepatitis C Screening  Completed   HIV Screening  Completed   Meningococcal B Vaccine  Aged Out        Problem  List Items Addressed This Visit   None Visit Diagnoses       Annual physical exam    -  Primary Recent labs normal       Vagina itching     Swab today. Treat pending results.    Relevant Orders   Cervicovaginal ancillary only     Abnormal menses     Negative pregnancy test today.  Under a lot of stress lately. Recommend she monitor cycles and continue following with GYN.  Relevant Orders   POCT urine pregnancy (Completed)     Change in mole       Relevant Orders   Ambulatory referral to Dermatology          PATIENT COUNSELING:   Advised to take 1 mg of folate supplement per day if capable of pregnancy.    Recommend that most people either abstain from alcohol or drink within safe limits (<=14/week and <=4 drinks/occasion for males, <=7/weeks and <= 3 drinks/occasion for females) and that the risk for alcohol disorders and other health effects rises proportionally with the number of drinks per week and how often a drinker exceeds daily limits.   Diet: Recommend to adjust caloric intake to maintain or achieve ideal body weight, to reduce intake of dietary saturated fat and total fat, to limit sodium intake by avoiding high sodium foods and not adding table salt, and to maintain adequate dietary potassium and calcium preferably from fresh fruits, vegetables, and low-fat dairy products.   Emphasized the importance of regular exercise.  Injury prevention: Recommend seatbelts, safety helmets, smoke detector, etc..   Dental health: Recommend regular tooth brushing, flossing, and dental visits.       Return in about 1 year (around 05/26/2025) for physical.     Waddell KATHEE Mon, NP

## 2024-05-28 LAB — CERVICOVAGINAL ANCILLARY ONLY
Bacterial Vaginitis (gardnerella): POSITIVE — AB
Candida Glabrata: POSITIVE — AB
Candida Vaginitis: NEGATIVE
Chlamydia: NEGATIVE
Comment: NEGATIVE
Comment: NEGATIVE
Comment: NEGATIVE
Comment: NEGATIVE
Comment: NEGATIVE
Comment: NORMAL
Neisseria Gonorrhea: NEGATIVE
Trichomonas: NEGATIVE

## 2024-05-29 ENCOUNTER — Ambulatory Visit: Payer: Self-pay

## 2024-05-29 ENCOUNTER — Other Ambulatory Visit: Payer: Self-pay | Admitting: Family Medicine

## 2024-05-29 ENCOUNTER — Ambulatory Visit: Payer: Self-pay | Admitting: Family Medicine

## 2024-05-29 DIAGNOSIS — B379 Candidiasis, unspecified: Secondary | ICD-10-CM

## 2024-05-29 DIAGNOSIS — N760 Acute vaginitis: Secondary | ICD-10-CM

## 2024-05-29 MED ORDER — METRONIDAZOLE 500 MG PO TABS
500.0000 mg | ORAL_TABLET | Freq: Two times a day (BID) | ORAL | 0 refills | Status: AC
Start: 2024-05-29 — End: 2024-06-05

## 2024-05-29 MED ORDER — METRONIDAZOLE 500 MG PO TABS: 500.0000 mg | ORAL_TABLET | Freq: Two times a day (BID) | ORAL | 0 refills | Status: DC

## 2024-05-29 MED ORDER — BORIC ACID VAGINAL 600 MG VA SUPP: 600.0000 mg | Freq: Every evening | VAGINAL | 0 refills | Status: DC

## 2024-05-29 NOTE — Telephone Encounter (Signed)
 Copied from CRM [id number]. Topic: Clinical - Prescription Issue >> May 29, 2024  2:50 PM [place] E wrote: Reason for CRM: Patient called in stating that her metroNIDAZOLE  (FLAGYL ) 500 MG tablet got sent to the wrong pharmacy. Patient would like this medication to go to:  [id number] - [place], [place] - [address] AT [facility] [address] >> May 29, 2024  2:54 PM [place] E wrote: Please call patient by end of day with an update, so she is aware.

## 2024-05-29 NOTE — Telephone Encounter (Signed)
 Rx unable to be sent electronically, rx faxed to [facility] in [place] as requested. Spoke w/ Pt- made her aware that Rx has been resent.

## 2024-06-11 ENCOUNTER — Ambulatory Visit (INDEPENDENT_AMBULATORY_CARE_PROVIDER_SITE_OTHER): Admitting: Family Medicine

## 2024-06-11 VITALS — BP 116/71 | HR 93 | Ht 62.0 in | Wt 192.0 lb

## 2024-06-11 DIAGNOSIS — B9689 Other specified bacterial agents as the cause of diseases classified elsewhere: Secondary | ICD-10-CM

## 2024-06-11 DIAGNOSIS — O039 Complete or unspecified spontaneous abortion without complication: Secondary | ICD-10-CM

## 2024-06-11 DIAGNOSIS — N760 Acute vaginitis: Secondary | ICD-10-CM

## 2024-06-11 NOTE — Progress Notes (Signed)
   Subjective:    Patient ID: Elizabeth Ray, female    DOB: [birth year], 23 y.o.   MRN: [id number]  HPI  Patient seen for follow-up of BV and yeast infection.  She was diagnosed on 915.  She was having thin discharge and mild pelvic pain.  She recently had a miscarriage.  She has not had a normal period yet.  She did have a lighter period with thin bleeding  Review of Systems     Objective:   Physical Exam Vitals reviewed.  Constitutional:      Appearance: Normal appearance.  Neurological:     General: No focal deficit present.     Mental Status: She is alert.  Psychiatric:        Mood and Affect: Mood normal.        Behavior: Behavior normal.        Thought Content: Thought content normal.        Judgment: Judgment normal.       Assessment & Plan:  1. BV (bacterial vaginosis) (Primary) Symptoms improved. TOC not needed  2. Spontaneous miscarriage Reaffirmed to patient that there is nothing she did to cause this. If has another SAB, will do RPL work up.

## 2024-06-30 ENCOUNTER — Encounter

## 2024-07-14 ENCOUNTER — Ambulatory Visit: Admitting: Family Medicine

## 2024-07-16 ENCOUNTER — Ambulatory Visit: Admitting: Family Medicine

## 2024-07-23 ENCOUNTER — Ambulatory Visit: Admitting: Family Medicine

## 2024-07-31 ENCOUNTER — Other Ambulatory Visit: Payer: Self-pay

## 2024-07-31 ENCOUNTER — Emergency Department (HOSPITAL_BASED_OUTPATIENT_CLINIC_OR_DEPARTMENT_OTHER)
Admission: EM | Admit: 2024-07-31 | Discharge: 2024-08-01 | Disposition: A | Discharge status: Home / Self Care | Attending: Emergency Medicine | Admitting: Emergency Medicine

## 2024-07-31 ENCOUNTER — Encounter (HOSPITAL_BASED_OUTPATIENT_CLINIC_OR_DEPARTMENT_OTHER): Payer: Self-pay | Admitting: Emergency Medicine

## 2024-07-31 ENCOUNTER — Emergency Department (HOSPITAL_BASED_OUTPATIENT_CLINIC_OR_DEPARTMENT_OTHER)

## 2024-07-31 DIAGNOSIS — B9689 Other specified bacterial agents as the cause of diseases classified elsewhere: Secondary | ICD-10-CM | POA: Insufficient documentation

## 2024-07-31 DIAGNOSIS — R059 Cough, unspecified: Secondary | ICD-10-CM | POA: Diagnosis not present

## 2024-07-31 DIAGNOSIS — R0789 Other chest pain: Secondary | ICD-10-CM | POA: Diagnosis not present

## 2024-07-31 DIAGNOSIS — N760 Acute vaginitis: Secondary | ICD-10-CM | POA: Diagnosis not present

## 2024-07-31 DIAGNOSIS — B9789 Other viral agents as the cause of diseases classified elsewhere: Secondary | ICD-10-CM | POA: Diagnosis not present

## 2024-07-31 DIAGNOSIS — D72829 Elevated white blood cell count, unspecified: Secondary | ICD-10-CM | POA: Diagnosis not present

## 2024-07-31 DIAGNOSIS — K219 Gastro-esophageal reflux disease without esophagitis: Secondary | ICD-10-CM

## 2024-07-31 DIAGNOSIS — R0989 Other specified symptoms and signs involving the circulatory and respiratory systems: Secondary | ICD-10-CM | POA: Diagnosis not present

## 2024-07-31 DIAGNOSIS — J069 Acute upper respiratory infection, unspecified: Secondary | ICD-10-CM | POA: Insufficient documentation

## 2024-07-31 LAB — URINALYSIS, ROUTINE W REFLEX MICROSCOPIC
Bilirubin Urine: NEGATIVE
Glucose, UA: NEGATIVE mg/dL
Hgb urine dipstick: NEGATIVE
Ketones, ur: NEGATIVE mg/dL
Leukocytes,Ua: NEGATIVE
Nitrite: NEGATIVE
Protein, ur: NEGATIVE mg/dL
Specific Gravity, Urine: 1.02 (ref 1.005–1.030)
pH: 7 (ref 5.0–8.0)

## 2024-07-31 LAB — CBC WITH DIFFERENTIAL/PLATELET
Abs Immature Granulocytes: 0.06 K/uL (ref 0.00–0.07)
Basophils Absolute: 0.1 K/uL (ref 0.0–0.1)
Basophils Relative: 1 %
Eosinophils Absolute: 0.1 K/uL (ref 0.0–0.5)
Eosinophils Relative: 1 %
HCT: 39.9 % (ref 36.0–46.0)
Hemoglobin: 13 g/dL (ref 12.0–15.0)
Immature Granulocytes: 1 %
Lymphocytes Relative: 21 %
Lymphs Abs: 2.3 K/uL (ref 0.7–4.0)
MCH: 28.5 pg (ref 26.0–34.0)
MCHC: 32.6 g/dL (ref 30.0–36.0)
MCV: 87.5 fL (ref 80.0–100.0)
Monocytes Absolute: 0.5 K/uL (ref 0.1–1.0)
Monocytes Relative: 4 %
Neutro Abs: 8.1 K/uL — ABNORMAL HIGH (ref 1.7–7.7)
Neutrophils Relative %: 72 %
Platelets: 249 K/uL (ref 150–400)
RBC: 4.56 MIL/uL (ref 3.87–5.11)
RDW: 12.9 % (ref 11.5–15.5)
WBC: 11.1 K/uL — ABNORMAL HIGH (ref 4.0–10.5)
nRBC: 0 % (ref 0.0–0.2)

## 2024-07-31 LAB — BASIC METABOLIC PANEL WITH GFR
Anion gap: 11 (ref 5–15)
BUN: 8 mg/dL (ref 6–20)
CO2: 23 mmol/L (ref 22–32)
Calcium: 9.2 mg/dL (ref 8.9–10.3)
Chloride: 105 mmol/L (ref 98–111)
Creatinine, Ser: 0.72 mg/dL (ref 0.44–1.00)
GFR, Estimated: >60 mL/min (ref >60)
Glucose, Bld: 102 mg/dL — ABNORMAL HIGH (ref 70–99)
Potassium: 3.8 mmol/L (ref 3.5–5.1)
Sodium: 140 mmol/L (ref 135–145)

## 2024-07-31 LAB — RESP PANEL BY RT-PCR (RSV, FLU A&B, COVID)  RVPGX2
Influenza A by PCR: NEGATIVE
Influenza B by PCR: NEGATIVE
Resp Syncytial Virus by PCR: NEGATIVE
SARS Coronavirus 2 by RT PCR: NEGATIVE

## 2024-07-31 LAB — WET PREP, GENITAL
Sperm: NONE SEEN
Trich, Wet Prep: NONE SEEN
WBC, Wet Prep HPF POC: <10 (ref <10)
Yeast Wet Prep HPF POC: NONE SEEN

## 2024-07-31 LAB — PREGNANCY, URINE: Preg Test, Ur: NEGATIVE

## 2024-07-31 LAB — TROPONIN T, HIGH SENSITIVITY: Troponin T High Sensitivity: <15 ng/L (ref 0–19)

## 2024-07-31 MED ORDER — ONDANSETRON 4 MG PO TBDP: 4.0000 mg | ORAL_TABLET | Freq: Three times a day (TID) | ORAL | 0 refills | Status: DC | PRN

## 2024-07-31 MED ORDER — METRONIDAZOLE 500 MG PO TABS
500.0000 mg | ORAL_TABLET | Freq: Once | ORAL | Status: AC
  Administered 2024-07-31: 500 mg via ORAL
  Filled 2024-07-31: qty 1

## 2024-07-31 MED ORDER — ONDANSETRON 4 MG PO TBDP
4.0000 mg | ORAL_TABLET | Freq: Once | ORAL | Status: AC
  Administered 2024-07-31: 4 mg via ORAL
  Filled 2024-07-31: qty 1

## 2024-07-31 MED ORDER — BENZONATATE 100 MG PO CAPS: 100.0000 mg | ORAL_CAPSULE | Freq: Three times a day (TID) | ORAL | 0 refills | Status: AC

## 2024-07-31 MED ORDER — PANTOPRAZOLE SODIUM 40 MG PO TBEC: 40.0000 mg | DELAYED_RELEASE_TABLET | Freq: Every day | ORAL | 0 refills | Status: AC

## 2024-07-31 MED ORDER — METRONIDAZOLE 500 MG PO TABS: 500.0000 mg | ORAL_TABLET | Freq: Two times a day (BID) | ORAL | 0 refills | Status: AC

## 2024-07-31 NOTE — ED Provider Notes (Signed)
 [facility] Provider Note   CSN: [id number] Arrival date & time: 07/31/24  1853     Patient presents with: URI   Elizabeth Ray is a 23 y.o. female with PMHx seizures, migraines who presents to ED concerned for subjective fevers, dry cough, congestion x1 week. Patient became concerned because early this afternoon she had a short episode of left sided chest pain around 3-4PM. Patient also endorses recent worsening of GERD. Also endorses intermittent nausea.  Patient has been taking Mucinex  and NyQuil for symptoms.  Denies dyspnea, vomiting.  Of note, patient also endorsing vaginal discharge and would like testing for this.  No abdominal pain.     URI Presenting symptoms: cough        Prior to Admission medications   Medication Sig Start Date End Date Taking? Authorizing Provider  benzonatate  (TESSALON ) 100 MG capsule Take 1 capsule (100 mg total) by mouth every 8 (eight) hours. 07/31/24  Yes Dymond Gutt, Nidia F, PA-C  metroNIDAZOLE  (FLAGYL ) 500 MG tablet Take 1 tablet (500 mg total) by mouth 2 (two) times daily for 7 days. 07/31/24 08/07/24 Yes Kataleia Quaranta, Nidia F, PA-C  ondansetron  (ZOFRAN -ODT) 4 MG disintegrating tablet Take 1 tablet (4 mg total) by mouth every 8 (eight) hours as needed for nausea. 07/31/24  Yes Hoy Nidia F, PA-C  pantoprazole (PROTONIX) 40 MG tablet Take 1 tablet (40 mg total) by mouth daily for 15 days. 07/31/24 08/15/24 Yes Tameron Lama, Nidia F, PA-C  levETIRAcetam  (KEPPRA ) 500 MG tablet Take 1.5 tablets(750mg ) twice daily 02/26/24   Klingerman, Amadou, MD    Allergies: Patient has no known allergies.    Review of Systems  Respiratory:  Positive for cough.     Updated Vital Signs BP 122/79   Pulse 80   Temp 98.5 F (36.9 C) (Oral)   Resp 14   SpO2 100%   Physical Exam Vitals and nursing note reviewed.  Constitutional:      General: She is not in acute distress.    Appearance: She is not  ill-appearing or toxic-appearing.  HENT:     Head: Normocephalic and atraumatic.     Mouth/Throat:     Mouth: Mucous membranes are moist.  Eyes:     General: No scleral icterus.       Right eye: No discharge.        Left eye: No discharge.     Conjunctiva/sclera: Conjunctivae normal.  Cardiovascular:     Rate and Rhythm: Normal rate and regular rhythm.     Pulses: Normal pulses.     Heart sounds: Normal heart sounds. No murmur heard. Pulmonary:     Effort: Pulmonary effort is normal. No respiratory distress.     Breath sounds: Normal breath sounds. No wheezing, rhonchi or rales.  Abdominal:     General: Abdomen is flat. Bowel sounds are normal. There is no distension.     Palpations: Abdomen is soft. There is no mass.     Tenderness: There is no abdominal tenderness.  Musculoskeletal:     Right lower leg: No edema.     Left lower leg: No edema.  Skin:    General: Skin is warm and dry.     Findings: No rash.  Neurological:     General: No focal deficit present.     Mental Status: She is alert and oriented to person, place, and time. Mental status is at baseline.  Psychiatric:        Mood and  Affect: Mood normal.        Behavior: Behavior normal.     (all labs ordered are listed, but only abnormal results are displayed) Labs Reviewed  WET PREP, GENITAL - Abnormal; Notable for the following components:      Result Value   Clue Cells Wet Prep HPF POC PRESENT (*)    All other components within normal limits  URINALYSIS, ROUTINE W REFLEX MICROSCOPIC - Abnormal; Notable for the following components:   APPearance HAZY (*)    All other components within normal limits  BASIC METABOLIC PANEL WITH GFR - Abnormal; Notable for the following components:   Glucose, Bld 102 (*)    All other components within normal limits  CBC WITH DIFFERENTIAL/PLATELET - Abnormal; Notable for the following components:   WBC 11.1 (*)    Neutro Abs 8.1 (*)    All other components within normal limits   RESP PANEL BY RT-PCR (RSV, FLU A&B, COVID)  RVPGX2  PREGNANCY, URINE  HIV ANTIBODY (ROUTINE TESTING W REFLEX)  GC/CHLAMYDIA PROBE AMP ([place]) NOT AT [facility]  TROPONIN T, HIGH SENSITIVITY    EKG: None  Radiology: DG Chest 2 View Result Date: 07/31/2024 CLINICAL DATA:  Cough and congestion for 1 week. EXAM: CHEST - 2 VIEW COMPARISON:  October 30, 2023 FINDINGS: The heart size and mediastinal contours are within normal limits. Both lungs are clear. The visualized skeletal structures are unremarkable. IMPRESSION: No active cardiopulmonary disease. Electronically Signed   By: Suzen Dials M.D.   On: 07/31/2024 23:27     Procedures   Medications Ordered in the ED  metroNIDAZOLE  (FLAGYL ) tablet 500 mg (has no administration in time range)  ondansetron  (ZOFRAN -ODT) disintegrating tablet 4 mg (4 mg Oral Given 07/31/24 2258)                                    Medical Decision Making Amount and/or Complexity of Data Reviewed Labs: ordered. Radiology: ordered.  Risk Prescription drug management.    This patient presents to the ED for concern of URI symptoms, vaginal discharge, this involves an extensive number of treatment options, and is a complaint that carries with it a high risk of complications and morbidity.  The differential diagnosis includes Flu/COVID/RSV, strep pharyngitis, sinusitis, peritonsillar abscess, retropharyngeal abscess, pneumonia, meningitis.   Co morbidities that complicate the patient evaluation   seizures, migraines    Additional history obtained:  Dr. Almarie PCP    Problem List / ED Course / Critical interventions / Medication management  Patient presents to ED concern for subjective fever, cough, congestion along with a short episode of chest pain earlier this afternoon.  Physical exam reassuring.  Patient afebrile with stable vitals. Of note, patient also concern for vaginal discharge and would like to be tested for this as well today. I  Ordered, and personally interpreted labs.  UA not concerning for infection.  CBC with mild leukocytosis at 11.1.  No anemia.  BMP reassuring.  UPT negative.  Wet prep positive for clue cells.  Respiratory panel negative.  Troponin within normal limits.  GC/committee a pending.  EKG sinus rhythm. I ordered imaging studies including chest xray to assess for process contributing to patient's symptoms. I independently visualized and interpreted imaging which showed no acute cardiopulmonary disease. I agree with the radiologist interpretation Shared all results with patient.  Answered all questions.  Will prescribe Flagyl  for BV.  Patient educated on  following up with the Department of Public Health for when the rest of the STD panel results.  Patient will also be prescribed Protonix and Zofran  for her acid reflux symptoms.  Will also be prescribed Tessalon  for her cough.  Patient to follow-up with PCP.  Patient agreeable to plan. I have reviewed the patients home medicines and have made adjustments as needed The patient has been appropriately medically screened and/or stabilized in the ED. I have low suspicion for any other emergent medical condition which would require further screening, evaluation or treatment in the ED or require inpatient management. At time of discharge the patient is hemodynamically stable and in no acute distress. I have discussed work-up results and diagnosis with patient and answered all questions. Patient is agreeable with discharge plan. We discussed strict return precautions for returning to the emergency department and they verbalized understanding.     Social Determinants of Health:  None      Final diagnoses:  Viral upper respiratory tract infection  BV (bacterial vaginosis)  Gastroesophageal reflux disease, unspecified whether esophagitis present    ED Discharge Orders          Ordered    pantoprazole (PROTONIX) 40 MG tablet  Daily        07/31/24 2344     ondansetron  (ZOFRAN -ODT) 4 MG disintegrating tablet  Every 8 hours PRN        07/31/24 2344    benzonatate  (TESSALON ) 100 MG capsule  Every 8 hours        07/31/24 2344    metroNIDAZOLE  (FLAGYL ) 500 MG tablet  2 times daily        07/31/24 2344               Hoy Nidia FALCON, PA-C 07/31/24 2350    Patt Alm Macho, MD 07/31/24 2351

## 2024-07-31 NOTE — ED Notes (Signed)
 Pt mentioned to EDP earlier tonight she had 1 sharp CP over in 5 seconds.   No CP at this time, labs, EKG and chest xray added to orders

## 2024-07-31 NOTE — ED Notes (Signed)
 Patient transported to X-ray

## 2024-07-31 NOTE — Discharge Instructions (Signed)

## 2024-07-31 NOTE — ED Triage Notes (Signed)
 Pt with cough and congestion x a week.  Has had a low grade fever.   Also wanted  to have her urine checked   has had vaginal discharge

## 2024-08-01 LAB — HIV ANTIBODY (ROUTINE TESTING W REFLEX): HIV Screen 4th Generation wRfx: NONREACTIVE

## 2024-08-01 NOTE — ED Notes (Signed)
Patient verbalizes understanding of discharge instructions. Opportunity for questioning and answers were provided. Armband removed by staff, pt discharged from ED. Ambulated out to lobby with friend

## 2024-08-04 LAB — GC/CHLAMYDIA PROBE AMP (~~LOC~~) NOT AT ARMC
Chlamydia: NEGATIVE
Comment: NEGATIVE
Comment: NORMAL
Neisseria Gonorrhea: NEGATIVE

## 2024-08-06 ENCOUNTER — Ambulatory Visit: Payer: Self-pay

## 2024-08-06 ENCOUNTER — Telehealth: Admitting: Emergency Medicine

## 2024-08-06 DIAGNOSIS — J069 Acute upper respiratory infection, unspecified: Secondary | ICD-10-CM

## 2024-08-06 MED ORDER — BUDESONIDE-FORMOTEROL FUMARATE 80-4.5 MCG/ACT IN AERO: 2.0000 | INHALATION_SPRAY | Freq: Two times a day (BID) | RESPIRATORY_TRACT | 0 refills | Status: AC

## 2024-08-06 MED ORDER — SPACER/AERO-HOLDING CHAMBERS DEVI: 1.0000 | 0 refills | Status: AC | PRN

## 2024-08-06 NOTE — Patient Instructions (Signed)
  Elizabeth Ray, thank you for joining Elizabeth CHRISTELLA Belt, NP for today's virtual visit.  While this provider is not your primary care provider (PCP), if your PCP is located in our provider database this encounter information will be shared with them immediately following your visit.   A [facility] MyChart account gives you access to today's visit and all your visits, tests, and labs performed at [facility]  click here if you don't have a [facility] MyChart account or go to [email or web address]  Consent: (Patient) Elizabeth Ray provided verbal consent for this virtual visit at the beginning of the encounter.  Current Medications:  Current Outpatient Medications:    budesonide -formoterol  (SYMBICORT ) 80-4.5 MCG/ACT inhaler, Inhale 2 puffs into the lungs 2 (two) times daily., Disp: 1 each, Rfl: 0   Spacer/Aero-Holding Chambers DEVI, 1 each by Does not apply route as needed., Disp: 1 each, Rfl: 0   benzonatate  (TESSALON ) 100 MG capsule, Take 1 capsule (100 mg total) by mouth every 8 (eight) hours., Disp: 21 capsule, Rfl: 0   levETIRAcetam  (KEPPRA ) 500 MG tablet, Take 1.5 tablets(750mg ) twice daily, Disp: 90 tablet, Rfl: 11   metroNIDAZOLE  (FLAGYL ) 500 MG tablet, Take 1 tablet (500 mg total) by mouth 2 (two) times daily for 7 days., Disp: 14 tablet, Rfl: 0   ondansetron  (ZOFRAN -ODT) 4 MG disintegrating tablet, Take 1 tablet (4 mg total) by mouth every 8 (eight) hours as needed for nausea., Disp: 10 tablet, Rfl: 0   pantoprazole  (PROTONIX ) 40 MG tablet, Take 1 tablet (40 mg total) by mouth daily for 15 days., Disp: 15 tablet, Rfl: 0   Medications ordered in this encounter:  Meds ordered this encounter  Medications   budesonide -formoterol  (SYMBICORT ) 80-4.5 MCG/ACT inhaler    Sig: Inhale 2 puffs into the lungs 2 (two) times daily.    Dispense:  1 each    Refill:  0   Spacer/Aero-Holding Chambers DEVI    Sig: 1 each by Does not apply route as needed.    Dispense:  1 each     Refill:  0     *If you need refills on other medications prior to your next appointment, please contact your pharmacy*  Follow-Up: Call back or seek an in-person evaluation if the symptoms worsen or if the condition fails to improve as anticipated.  [facility] [phone]  Other Instructions  Continue taking Mucinex . When you feel like you need to cough up something, try huff coughing first to see if you can get any congestion to move up and then cough regular.    If you have been instructed to have an in-person evaluation today at a local Urgent Care facility, please use the link below. It will take you to a list of all of our available [facility], including address, phone number and hours of operation. Please do not delay care.  [facility]  If you or a family member do not have a primary care provider, use the link below to schedule a visit and establish care. When you choose a [facility] primary care physician or advanced practice provider, you gain a long-term partner in health. Find a Primary Care Provider  Learn more about [facility]'s in-office and virtual care options: [place] - Get Care Now
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
## 2024-08-06 NOTE — Telephone Encounter (Signed)
 FYI Only or Action Required?: FYI only for provider: scheduled for virtual appointment tonight with [facility].  Patient was last seen in primary care on 05/26/2024 by Almarie Waddell NOVAK, NP.  Called Nurse Triage reporting Cough.  Symptoms began a week ago.  Interventions attempted: Prescription medications: See MAR and Rest, hydration, or home remedies.  Symptoms are: unchanged.  Triage Disposition: See Physician Within 24 Hours  Patient/caregiver understands and will follow disposition?: Yes  Copied from CRM #[id number]. Topic: Clinical - Red Word Triage >> Aug 06, 2024  3:46 PM Tinnie BROCKS wrote: Red Word that prompted transfer to Nurse Triage: Was in ER 11/20 for persistant cough and was prescribed cough medicine. Since then, she has started coughing up very thick, lime green-colored mucous. Wants something called in for this and states she is not in the area right now. Reason for Disposition  SEVERE coughing spells (e.g., whooping sound after coughing, vomiting after coughing)  Answer Assessment - Initial Assessment Questions 1. ONSET: When did the cough begin?      Started a week ago 2. SEVERITY: How bad is the cough today?      severe 3. SPUTUM: Describe the color of your sputum (e.g., none, dry cough; clear, white, yellow, green)     Green sputum 4. HEMOPTYSIS: Are you coughing up any blood? If Yes, ask: How much? (e.g., flecks, streaks, tablespoons, etc.)     no 5. DIFFICULTY BREATHING: Are you having difficulty breathing? If Yes, ask: How bad is it? (e.g., mild, moderate, severe)      no 6. FEVER: Do you have a fever? If Yes, ask: What is your temperature, how was it measured, and when did it start?     no 7. CARDIAC HISTORY: Do you have any history of heart disease? (e.g., heart attack, congestive heart failure)      no 8. LUNG HISTORY: Do you have any history of lung disease?  (e.g., pulmonary embolus, asthma, emphysema)     no 9. PE RISK  FACTORS: Do you have a history of blood clots? (or: recent major surgery, recent prolonged travel, bedridden)     no 10. OTHER SYMPTOMS: Do you have any other symptoms? (e.g., runny nose, wheezing, chest pain)       Runny nose, chest tightness 11. PREGNANCY: Is there any chance you are pregnant? When was your last menstrual period?       no 12. TRAVEL: Have you traveled out of the country in the last month? (e.g., travel history, exposures)       no  Protocols used: Cough - Acute Productive-A-AH

## 2024-08-15 ENCOUNTER — Ambulatory Visit: Admitting: Family Medicine

## 2024-08-15 NOTE — Progress Notes (Incomplete)
 Acute Office Visit  Subjective:  Patient ID: Elizabeth Ray, female    DOB: December 24, 2000  Age: 23 y.o. MRN: [id number]  CC: No chief complaint on file.     HPI [name] is here for sore throat and chest congestion. Seen 08/06/2024 via video visit by another provider for an URI, and prior to that seen in the emergency department on 07/31/2024 for the same issue. She was prescribed a Symbicort  inhaler and Tessalon  100 mg as needed.           Past Medical History:  Diagnosis Date   ADHD (attention deficit hyperactivity disorder)    Anxiety    Bipolar and related disorder (HCC) 07/15/2015   Depression    Mental disorder    Seizures (HCC)    up until age 30    Past Surgical History:  Procedure Laterality Date   DILATION AND EVACUATION N/A 07/16/2014   Procedure: DILATATION AND EVACUATION;  Surgeon: Norleen Edsel GAILS, MD;  Location: [facility];  Service: Gynecology;  Laterality: N/A;    Family History  Problem Relation Age of Onset   Diabetes Mother    Diabetes Other    Hypertension Other     Social History   Socioeconomic History   Marital status: Single    Spouse name: Not on file   Number of children: Not on file   Years of education: Not on file   Highest education level: Some college, no degree  Occupational History   Not on file  Tobacco Use   Smoking status: Former    Current packs/day: 0.50    Types: Cigarettes   Smokeless tobacco: Never   Tobacco comments:    11/27/2023- Pt states she smokes 2 cigarettes a day   Vaping Use   Vaping status: Never Used  Substance and Sexual Activity   Alcohol use: Not Currently    Comment: occ   Drug use: No   Sexual activity: Yes  Other Topics Concern   Not on file  Social History Narrative   Not on file   Social Drivers of Health   Financial Resource Strain: Medium Risk (08/13/2024)   Overall Financial Resource Strain (CARDIA)    Difficulty of Paying Living Expenses: Somewhat hard  Food Insecurity: Patient  Declined (08/13/2024)   Hunger Vital Sign    Worried About Running Out of Food in the Last Year: Patient declined    Ran Out of Food in the Last Year: Patient declined  Transportation Needs: Patient Declined (08/13/2024)   PRAPARE - Administrator, Civil Service (Medical): Patient declined    Lack of Transportation (Non-Medical): Patient declined  Physical Activity: Inactive (08/13/2024)   Exercise Vital Sign    Days of Exercise per Week: 0 days    Minutes of Exercise per Session: Not on file  Stress: Stress Concern Present (08/13/2024)   [employer] of Occupational Health - Occupational Stress Questionnaire    Feeling of Stress: To some extent  Social Connections: Unknown (08/13/2024)   Social Connection and Isolation Panel    Frequency of Communication with Friends and Family: Once a week    Frequency of Social Gatherings with Friends and Family: Patient declined    Attends Religious Services: 1 to 4 times per year    Active Member of [employer] or Organizations: No    Attends Banker Meetings: Not on file    Marital Status: Divorced  Intimate Partner Violence: Unknown (01/26/2022)   Received from [facility]   HITS    Physically Hurt: Not on file    Insult or Talk Down To: Not on file    Threaten Physical Harm: Not on file    Scream or Curse: Not on file    ROS All ROS negative except what is listed in the HPI.   Objective:   Today's Vitals: There were no vitals taken for this visit.  Physical Exam  Assessment & Plan:   Problem List Items Addressed This Visit   None     Follow-up: No follow-ups on file.   Waddell FURY Almarie, DNP, FNP-C  I,Emily Lagle,acting as a neurosurgeon for Waddell KATHEE Almarie, NP.,have documented all relevant documentation on the behalf of Waddell KATHEE Almarie, NP.   I, Waddell KATHEE Almarie, NP, have reviewed all documentation for this visit. The documentation on 08/15/2024 for the exam, diagnosis, procedures, and orders are all accurate and  complete.

## 2024-08-19 DIAGNOSIS — J189 Pneumonia, unspecified organism: Secondary | ICD-10-CM | POA: Diagnosis not present

## 2024-10-16 ENCOUNTER — Telehealth: Admitting: Emergency Medicine

## 2024-10-16 DIAGNOSIS — T50905A Adverse effect of unspecified drugs, medicaments and biological substances, initial encounter: Secondary | ICD-10-CM

## 2024-10-16 NOTE — Patient Instructions (Signed)
 Your symptoms are of such severity, that they cannot be managed through a virtual visit.  You will need to be seen in person. Please return to the hospital.   Elizabeth Ray, thank you for joining Lamar Schlossman, PA-C for today's virtual visit.  While this provider is not your primary care provider (PCP), if your PCP is located in our provider database this encounter information will be shared with them immediately following your visit.   A [facility] MyChart account gives you access to today's visit and all your visits, tests, and labs performed at [facility]  click here if you don't have a [facility] MyChart account or go to [email or web address]  Consent: (Patient) Elizabeth Ray provided verbal consent for this virtual visit at the beginning of the encounter.  Current Medications:  Current Outpatient Medications:    benzonatate  (TESSALON ) 100 MG capsule, Take 1 capsule (100 mg total) by mouth every 8 (eight) hours., Disp: 21 capsule, Rfl: 0   budesonide -formoterol  (SYMBICORT ) 80-4.5 MCG/ACT inhaler, Inhale 2 puffs into the lungs 2 (two) times daily., Disp: 1 each, Rfl: 0   levETIRAcetam  (KEPPRA ) 500 MG tablet, Take 1.5 tablets(750mg ) twice daily, Disp: 90 tablet, Rfl: 11   pantoprazole  (PROTONIX ) 40 MG tablet, Take 1 tablet (40 mg total) by mouth daily for 15 days., Disp: 15 tablet, Rfl: 0   Spacer/Aero-Holding Chambers DEVI, 1 each by Does not apply route as needed., Disp: 1 each, Rfl: 0   Medications ordered in this encounter:  No orders of the defined types were placed in this encounter.    *If you need refills on other medications prior to your next appointment, please contact your pharmacy*  Follow-Up: Call back or seek an in-person evaluation if the symptoms worsen or if the condition fails to improve as anticipated.     Other Instructions Based on what you shared with me, I feel your condition warrants further evaluation as soon as possible at an  Emergency department.     If you are having a true medical emergency please call 911.      Emergency Department-[facility]  Get Driving Directions  [phone]  [address]  [place], [place] [address]  Open 24/7/365      [facility] Emergency Department at [facility]  Get Driving Directions  [address]  [place], [place] [address]  Open 24/7/365    Emergency Department- [facility]  Get Driving Directions  [phone]  [address]  [place], [place] [address]  Open 24/7/365      [facility] Emergency Department at [facility]  Get Driving Directions  [phone]  [address]  [place], [place] [address]  Open 24/7/365    [facility]  Emergency Department- [facility]  Get Driving Directions  [phone]  [address]  [place], [place] [address]  Open 24/7/365    [place]  Emergency Department- [facility]  Get Driving Directions  [address]  [place], [place] [address]  Open 24/7/365    [facility]  Emergency Department- [facility]  Get Driving Directions  [phone]  [address] [place] [address]  [place], [place] [address]  Open 24/7/365      If you have been instructed to have an in-person evaluation today at a local Urgent Care facility, please use the link below. It will take you to a list of all of our available  Urgent Cares, including address,  phone number and hours of operation. Please do not delay care.  [facility]gent C[facility]  If you or a family member do not have a primary care provider, use the link below to schedule a visit and establish care. When you choose a [facility] primary care physician or advanced practice provider, you gain a long-term partner in health. Find a Primary Care Provider  Learn more about [facility]'s in-office and virtual care options:  - Get Care  Now
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
## 2024-12-29 ENCOUNTER — Ambulatory Visit: Admitting: Physician Assistant
